# Patient Record
Sex: Female | Born: 1935 | Race: White | Hispanic: No | Smoking: Never smoker
Health system: Southern US, Community
[De-identification: ages and names within clinical notes are randomized; demographics above are authoritative.]

## PROBLEM LIST (undated history)

## (undated) DIAGNOSIS — F0280 Dementia in other diseases classified elsewhere without behavioral disturbance: Secondary | ICD-10-CM

## (undated) DIAGNOSIS — G309 Alzheimer's disease, unspecified: Secondary | ICD-10-CM

## (undated) DIAGNOSIS — E7800 Pure hypercholesterolemia, unspecified: Secondary | ICD-10-CM

---

## 2007-06-10 ENCOUNTER — Emergency Department: Payer: Self-pay | Admitting: Emergency Medicine

## 2007-06-10 IMAGING — CT CT MAXILLOFACIAL WITHOUT CONTRAST
1 series · 15 of 30 positions shown, 19 images · non-contrast
Comparison: none

REASON FOR EXAM: Facial Trauma
COMMENTS:   LMP: Post-Menopausal

[Series 2: orbit/face · axial · 0.30mm/px · z∈[-10,+134]mm · 15 of 52 slices shown, 19 images]
[im 2/52  brain]
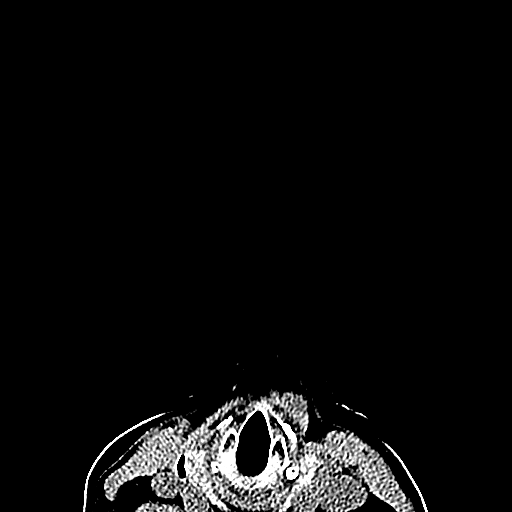
[im 2/52  bone]
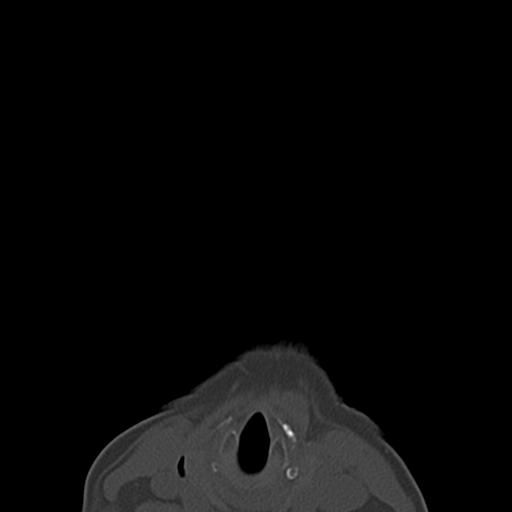
[im 6/52  bone]
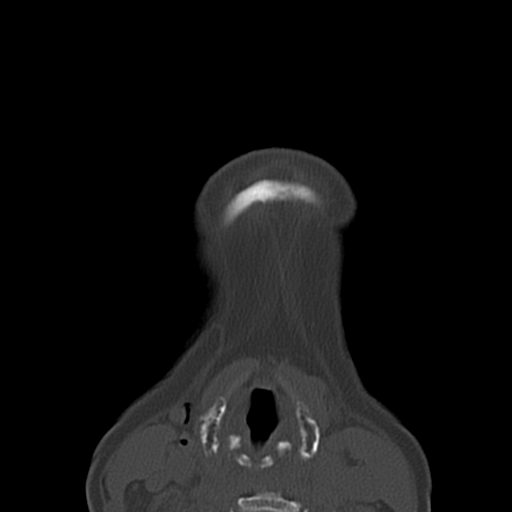
[im 9/52  bone]
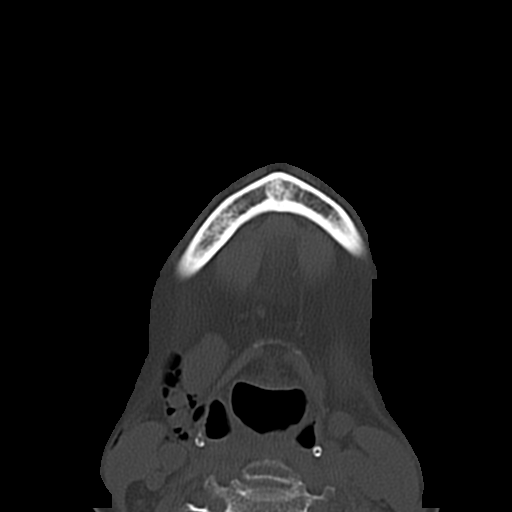
[im 13/52  bone]
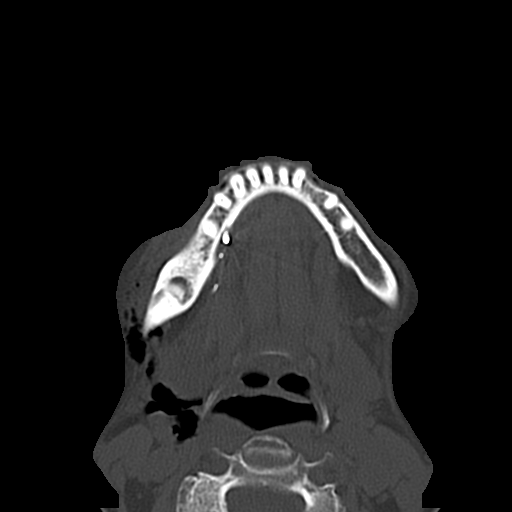
[im 16/52  brain]
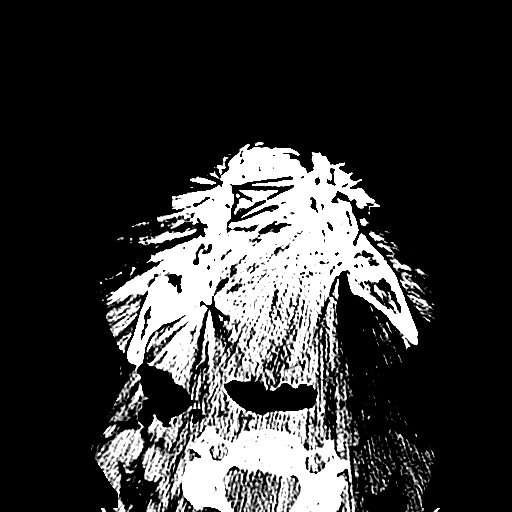
[im 16/52  bone]
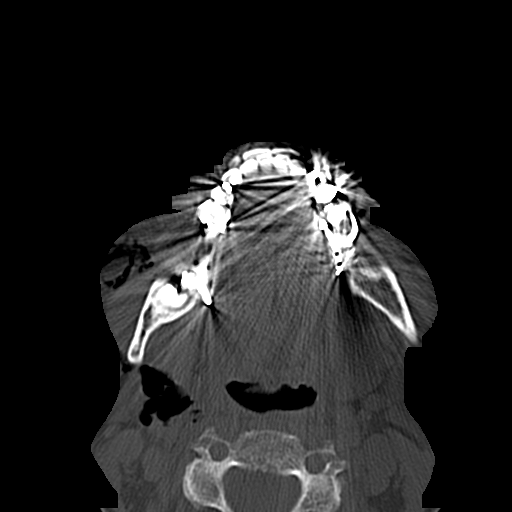
[im 20/52  bone]
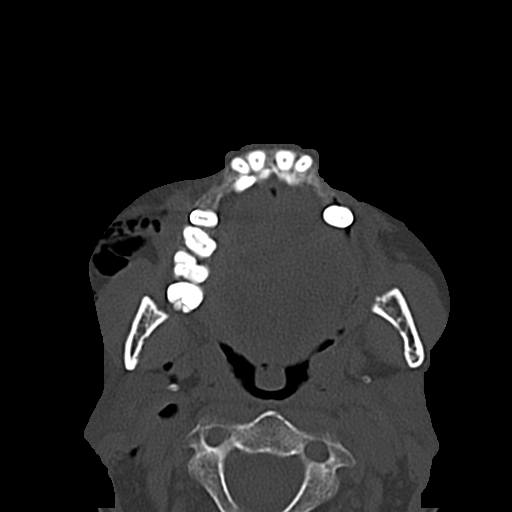
[im 23/52  bone]
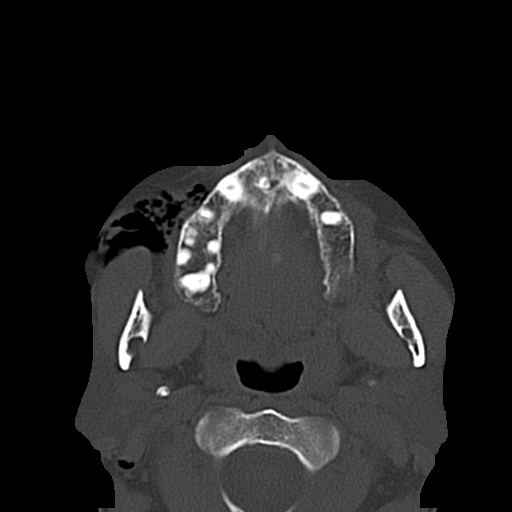
[im 27/52  bone]
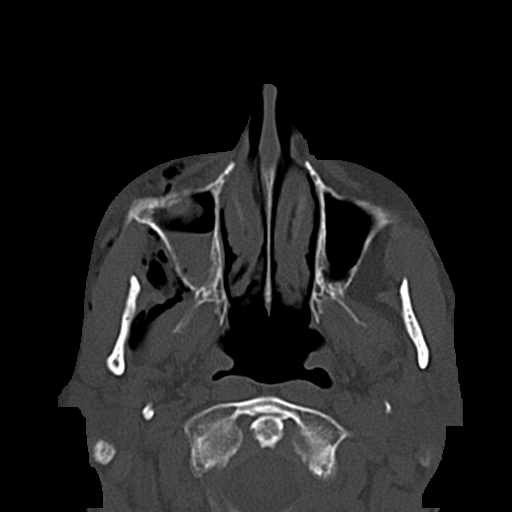
[im 29/52  brain]
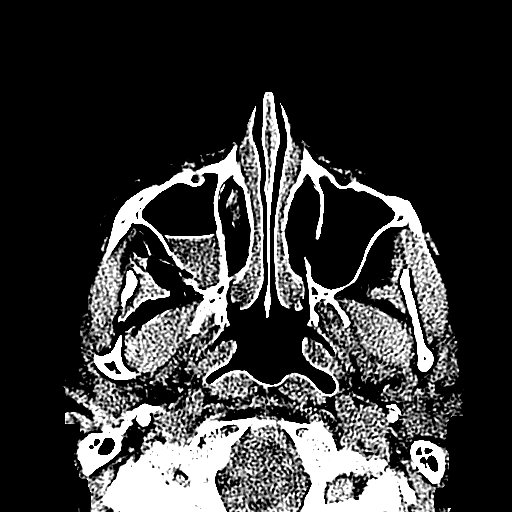
[im 29/52  bone]
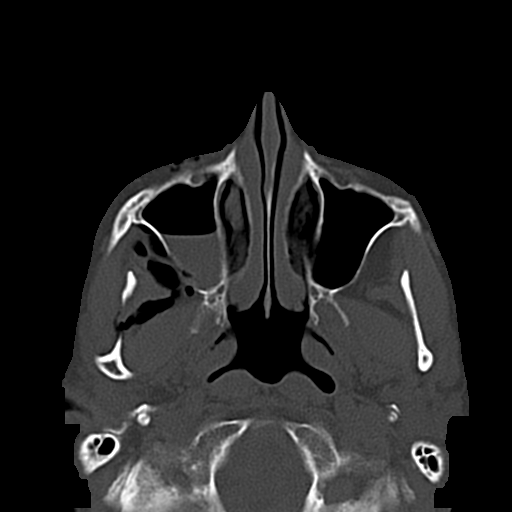
[im 32/52  bone]
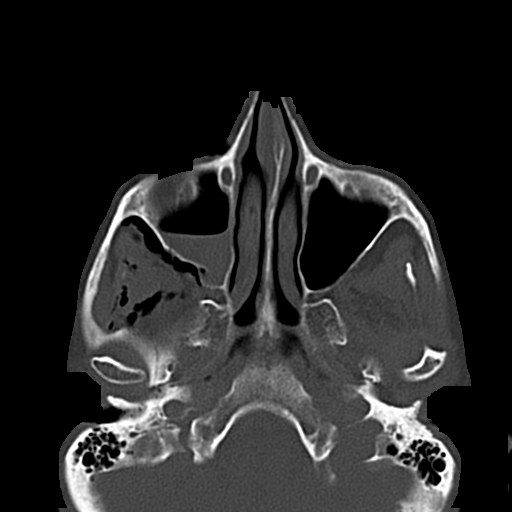
[im 36/52  bone]
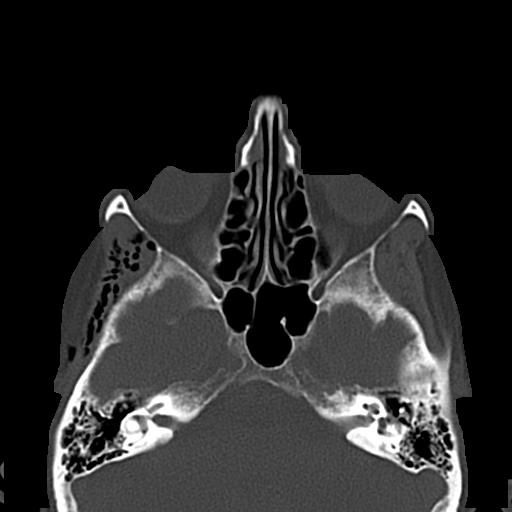
[im 39/52  bone]
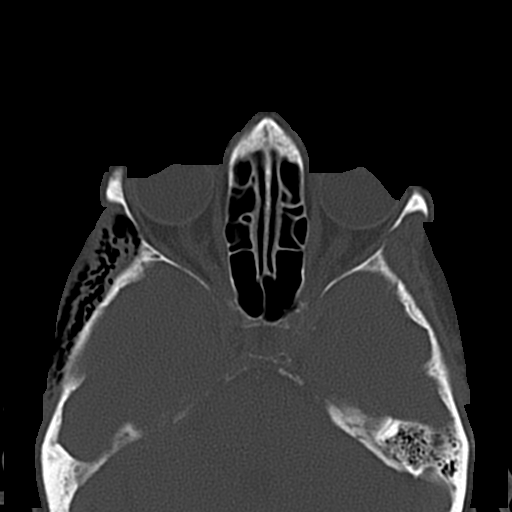
[im 43/52  brain]
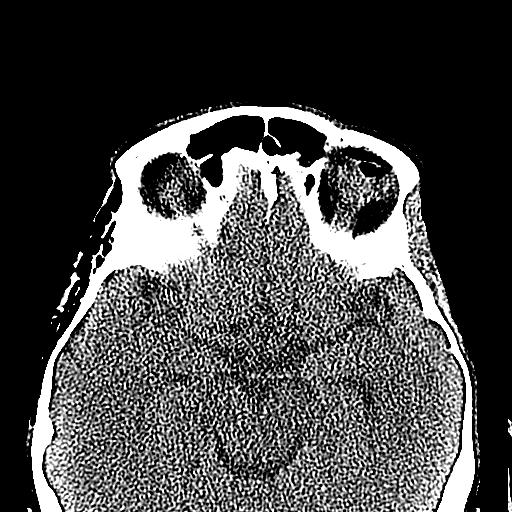
[im 43/52  bone]
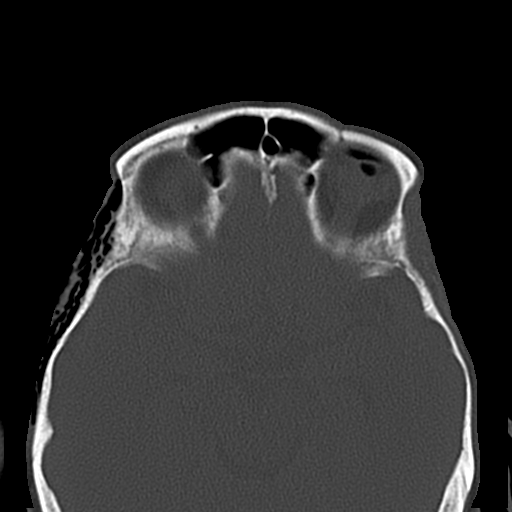
[im 46/52  bone]
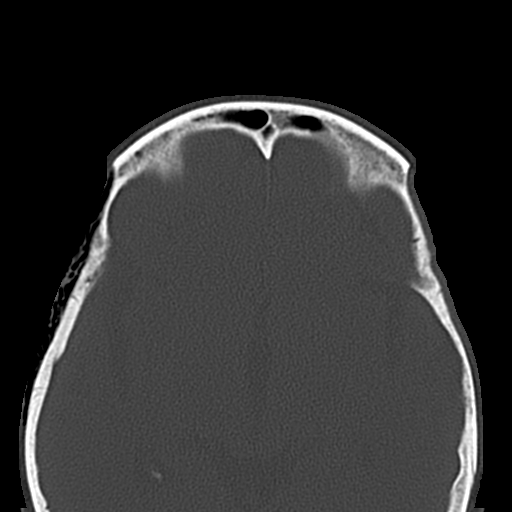
[im 50/52  bone]
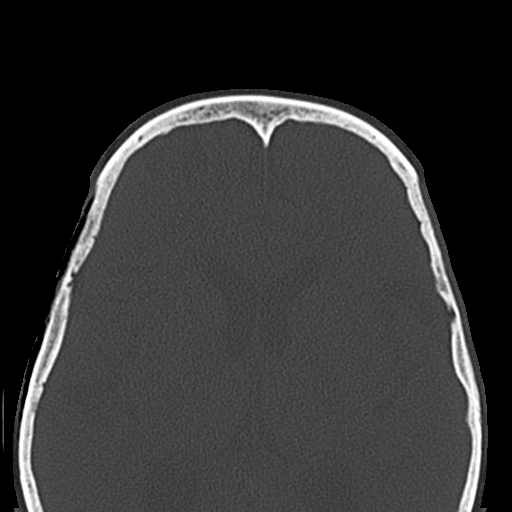

[15 of 30 positions shown; findings below may reference images not displayed]

PROCEDURE:     CT  - CT MAXILLOFACIAL AREA WO  - [DATE]  [DATE]

RESULT:     Axial and coronal imaging of the bones of the face was obtained
utilizing a bone algorithm.

A depressed fracture is appreciated along the lateral wall of the maxillary
sinus on the RIGHT.  Air-fluid levels are identified within the maxillary
sinus indicative of hemorrhage.  A mild depressed fracture is identified
along the floor of the orbit. Subcutaneous emphysema is appreciated along
the subcutaneous tissues of the temporal region extending along the jaw and
into the area of the muscles of mastication.  The mandible appears to be
intact.
IMPRESSION: 1.     Multiple fractures involving the floor of the orbit, lateral wall of
the maxillary sinus on the RIGHT.
2.     Not mentioned above, there are also findings concerning for a
nondisplaced fracture versus a suture along the lateral wall of the orbit.
3.     Dr. RAMOCSA of the Emergency Department was informed of these findings
at the time of the initial interpretation.

## 2007-06-10 IMAGING — CT CT HEAD WITHOUT CONTRAST
1 series · 15 of 30 positions shown, 19 images · non-contrast
Comparison: none

REASON FOR EXAM: Head Trauma
COMMENTS:   LMP: Post-Menopausal

[Series 2: soft tissue · axial · 0.41mm/px · z∈[+74,+209]mm · 15 of 30 slices shown, 19 images]
[im 2/30  brain]
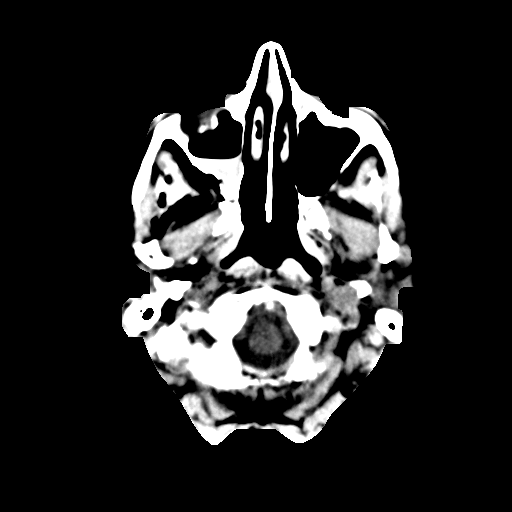
[im 2/30  bone]
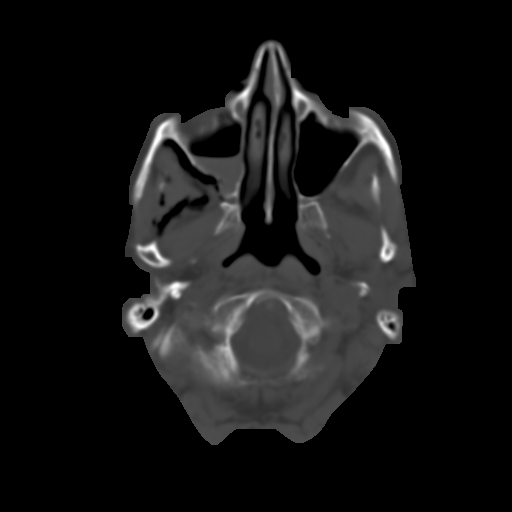
[im 4/30  brain]
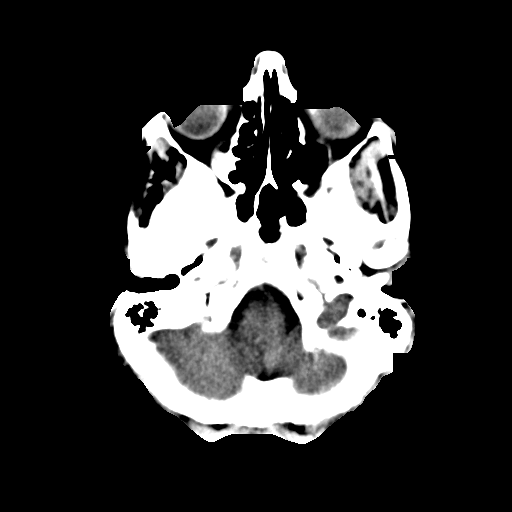
[im 6/30  brain]
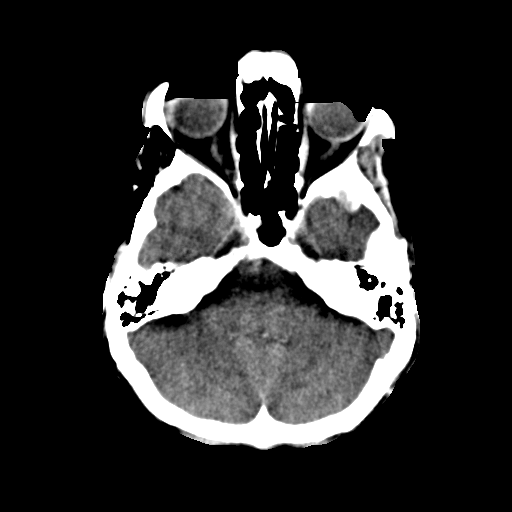
[im 8/30  brain]
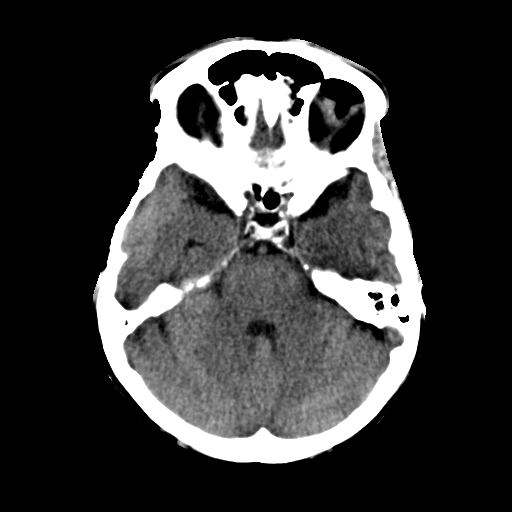
[im 10/30  brain]
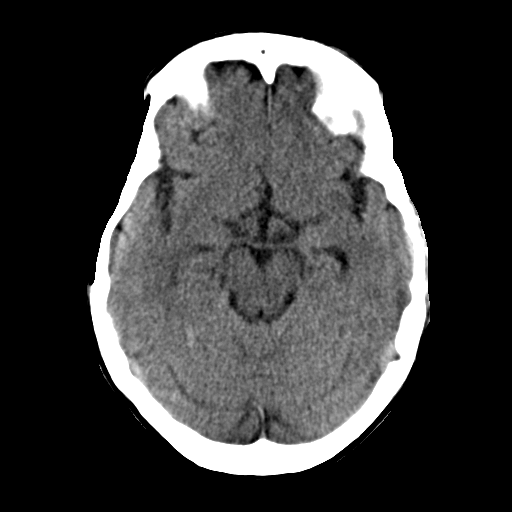
[im 10/30  bone]
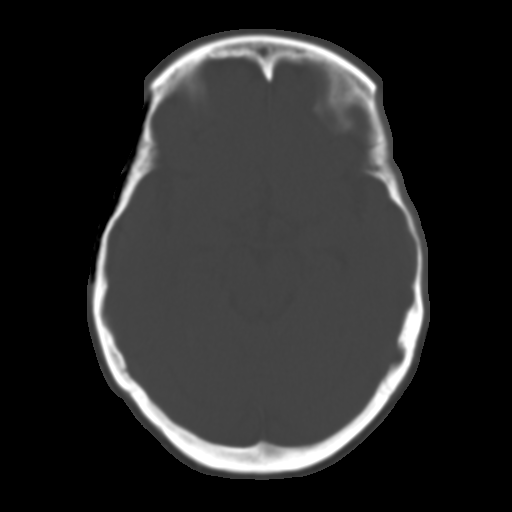
[im 12/30  brain]
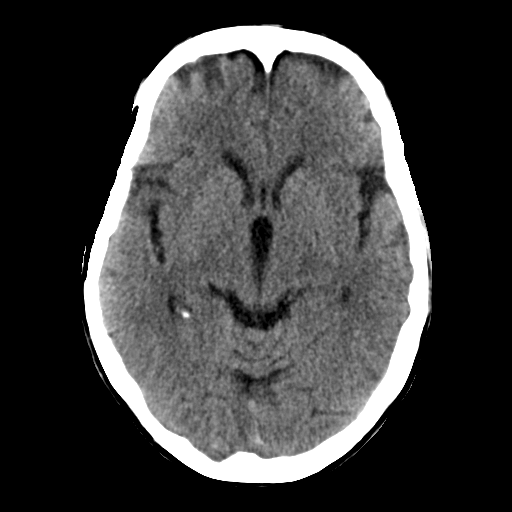
[im 14/30  brain]
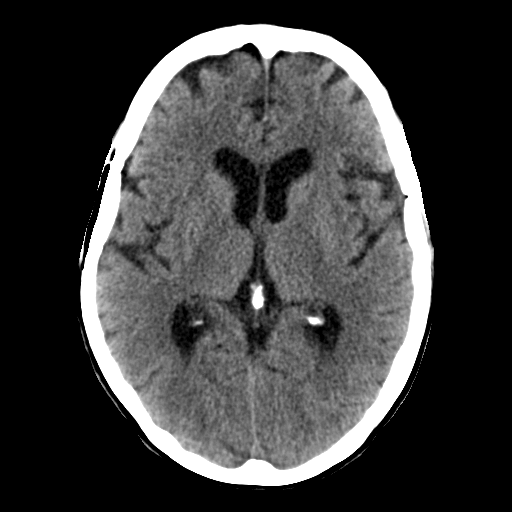
[im 16/30  brain]
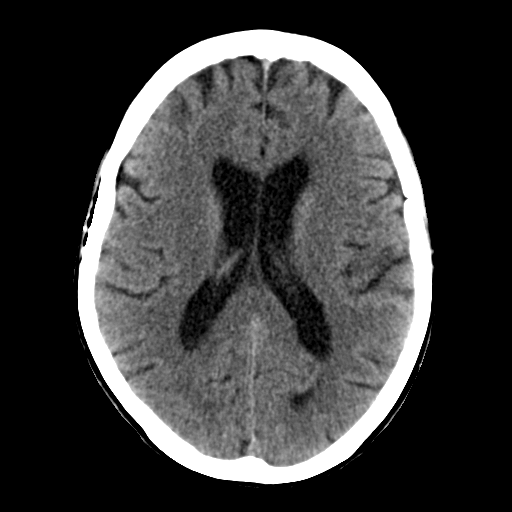
[im 17/30  brain]
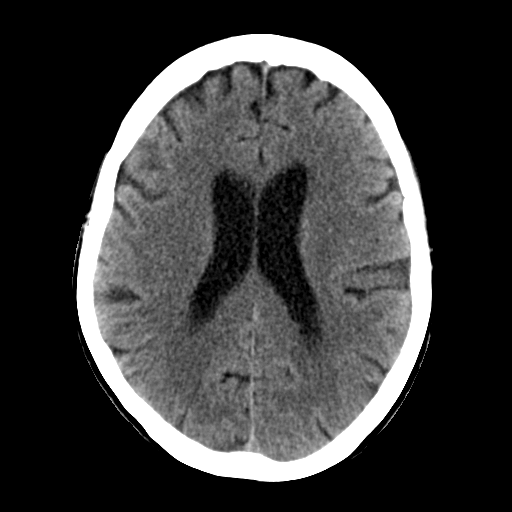
[im 17/30  bone]
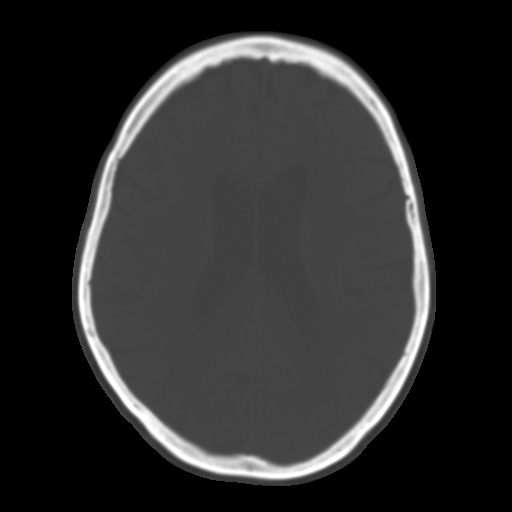
[im 19/30  brain]
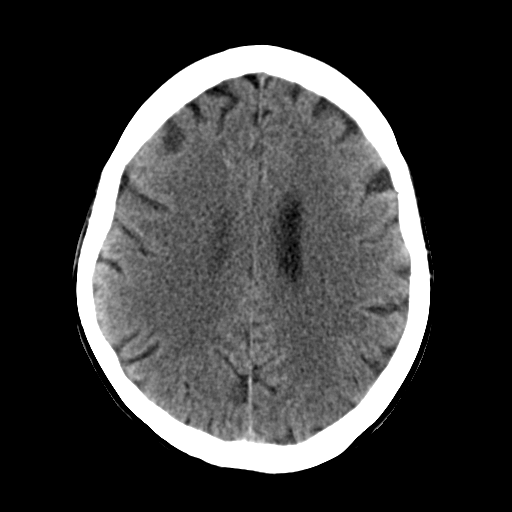
[im 21/30  brain]
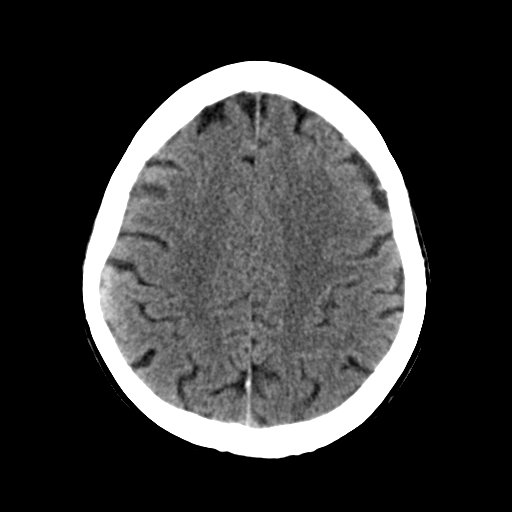
[im 23/30  brain]
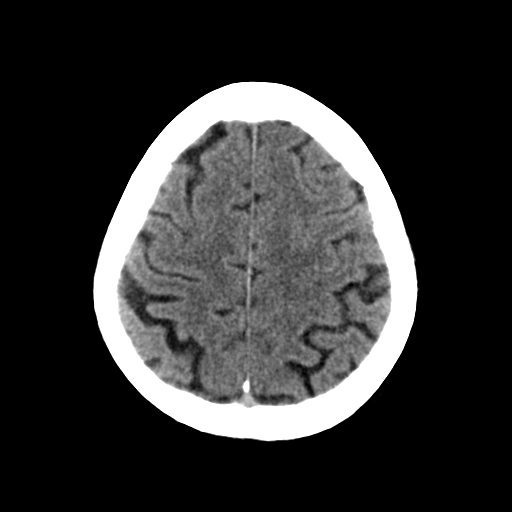
[im 25/30  brain]
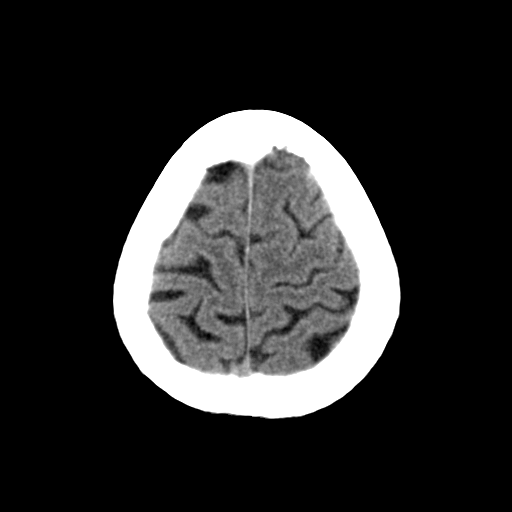
[im 25/30  bone]
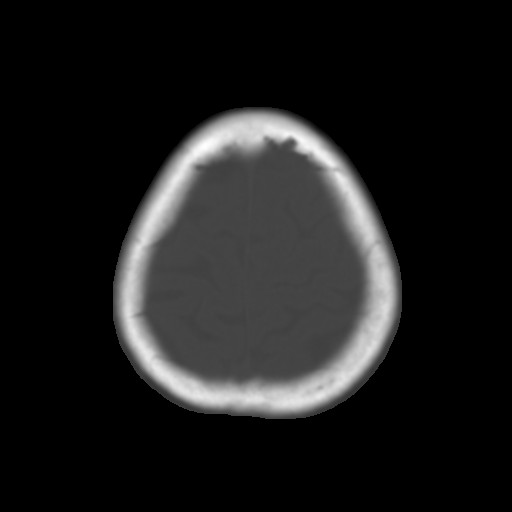
[im 27/30  brain]
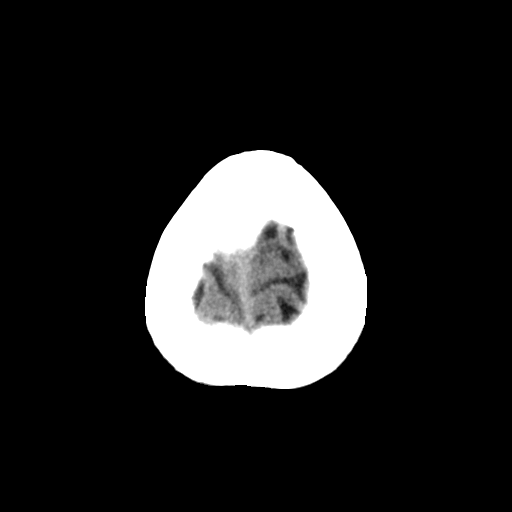
[im 29/30  brain]
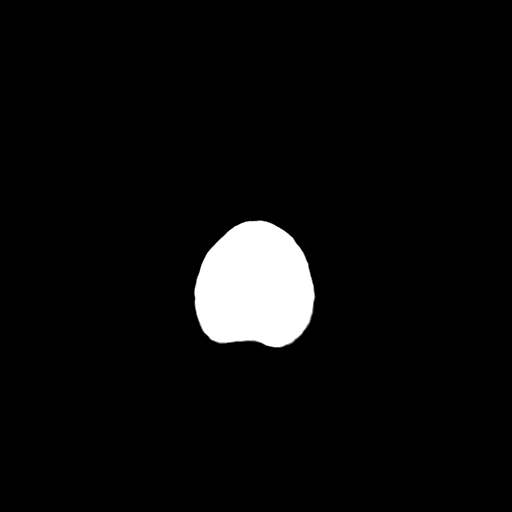

[15 of 30 positions shown; findings below may reference images not displayed]

PROCEDURE:     CT  - CT HEAD WITHOUT CONTRAST  - [DATE]  [DATE]

RESULT:     There is no evidence of intraaxial or extraaxial fluid
collection or evidence of acute hemorrhage. No secondary signs are
appreciated to suggest mass effect, subacute or chronic infarction.
Evaluation of the visualized bony skeleton demonstrates an air-fluid level
within the RIGHT maxillary sinus. The fluid is of high density and is
concerning for hemorrhage. Emphysema is identified along the anterior
temporal portion of the subcutaneous tissues as well as involving the
masseter region.  Further evaluation with facial CT is recommended.
IMPRESSION: 1.     No evidence of intracranial abnormalities.
2.     Findings concerning for osseous abnormalities involving the maxillary
sinus on the RIGHT as well as possibly the zygoma and mandible.
3.     Not mentioned above, there is evidence of mildly depressed fracture
along the posterior aspect of the lateral wall of the maxillary sinus on the
RIGHT.
4.     Dr. EBADAT of the Emergency Department was informed of these findings
at the time of the initial interpretation.

## 2008-08-26 ENCOUNTER — Encounter: Payer: Self-pay | Admitting: General Practice

## 2008-09-22 ENCOUNTER — Encounter: Payer: Self-pay | Admitting: General Practice

## 2008-10-22 ENCOUNTER — Encounter: Payer: Self-pay | Admitting: General Practice

## 2016-12-05 ENCOUNTER — Other Ambulatory Visit (HOSPITAL_COMMUNITY): Payer: Self-pay | Admitting: Neurology

## 2016-12-05 DIAGNOSIS — R413 Other amnesia: Secondary | ICD-10-CM

## 2016-12-19 ENCOUNTER — Ambulatory Visit (HOSPITAL_COMMUNITY): Payer: Medicare Other

## 2017-10-28 ENCOUNTER — Other Ambulatory Visit: Payer: Self-pay | Admitting: Internal Medicine

## 2017-10-28 DIAGNOSIS — Z1231 Encounter for screening mammogram for malignant neoplasm of breast: Secondary | ICD-10-CM

## 2018-09-21 IMAGING — CR DG HIP (WITH OR WITHOUT PELVIS) 2-3V*L*
1 series · 3 of 3 positions shown · non-contrast
Comparison: None.

CLINICAL DATA: Chronic left hip pain without known injury.

EXAM:
DG HIP (WITH OR WITHOUT PELVIS) 2-3V LEFT

[Series 1: dg hip unilat w or w/o pelvis 2-3 views  · non-contrast · 0.14mm/px · 3 of 3 slices shown]
[im 1/3]
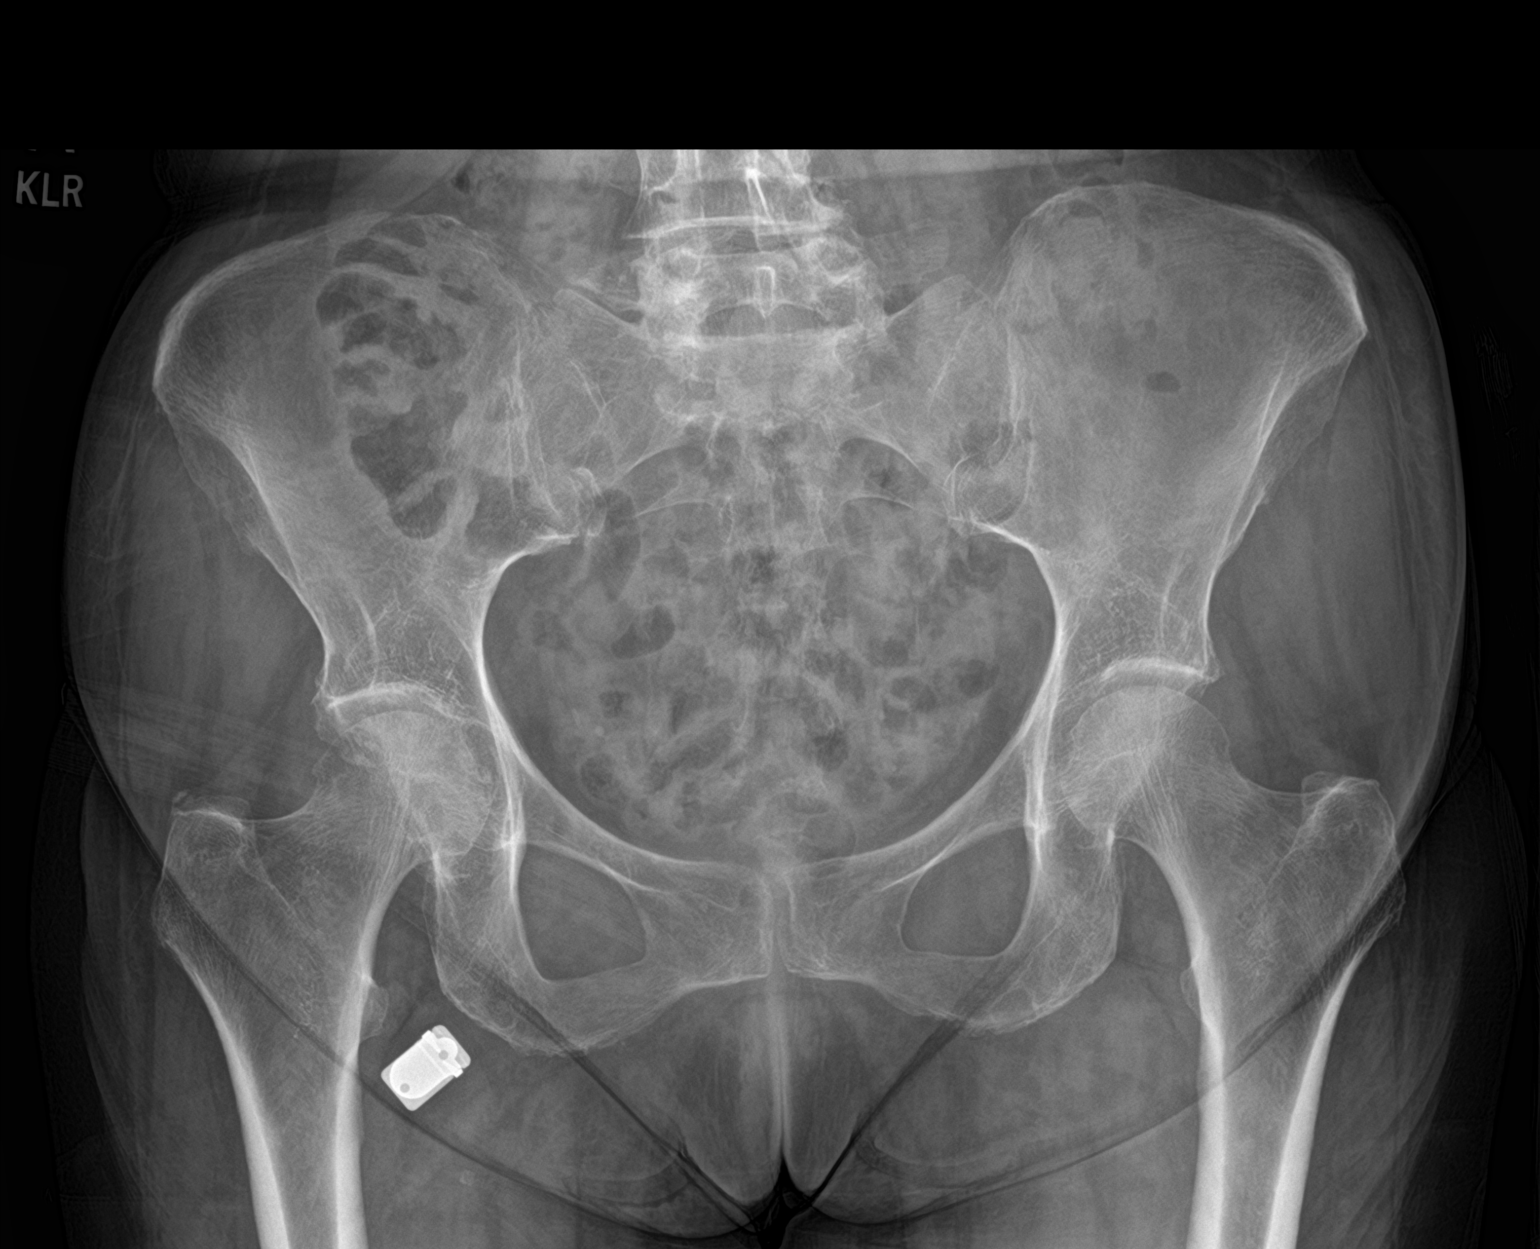
[im 2/3]
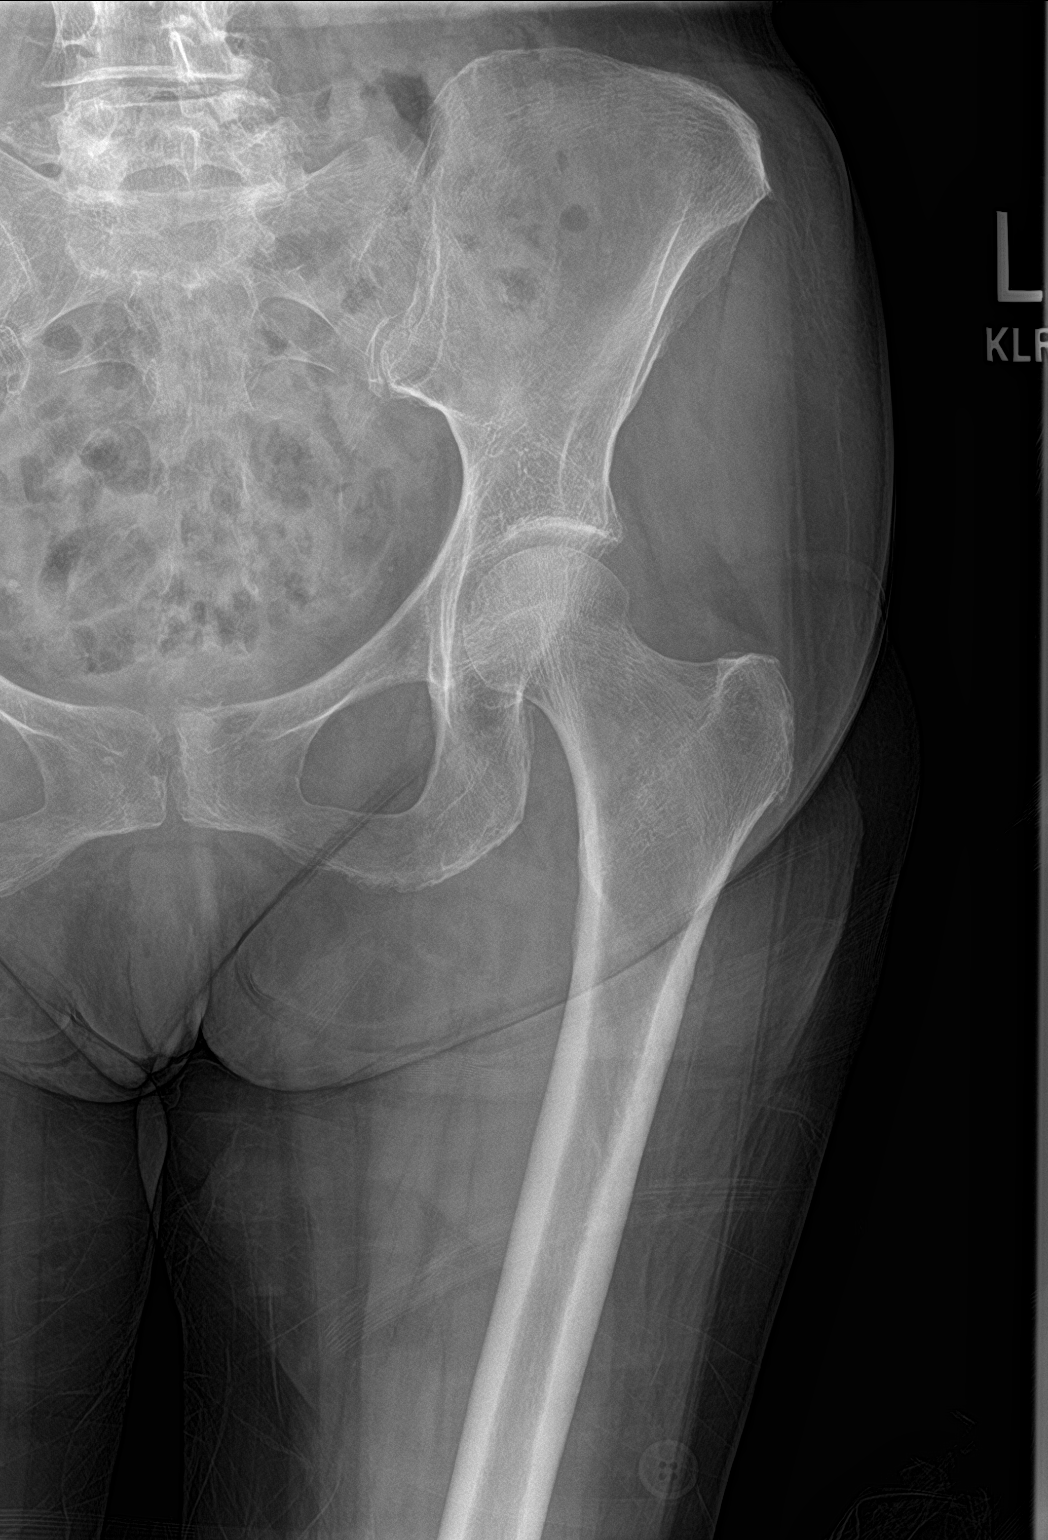
[im 3/3]
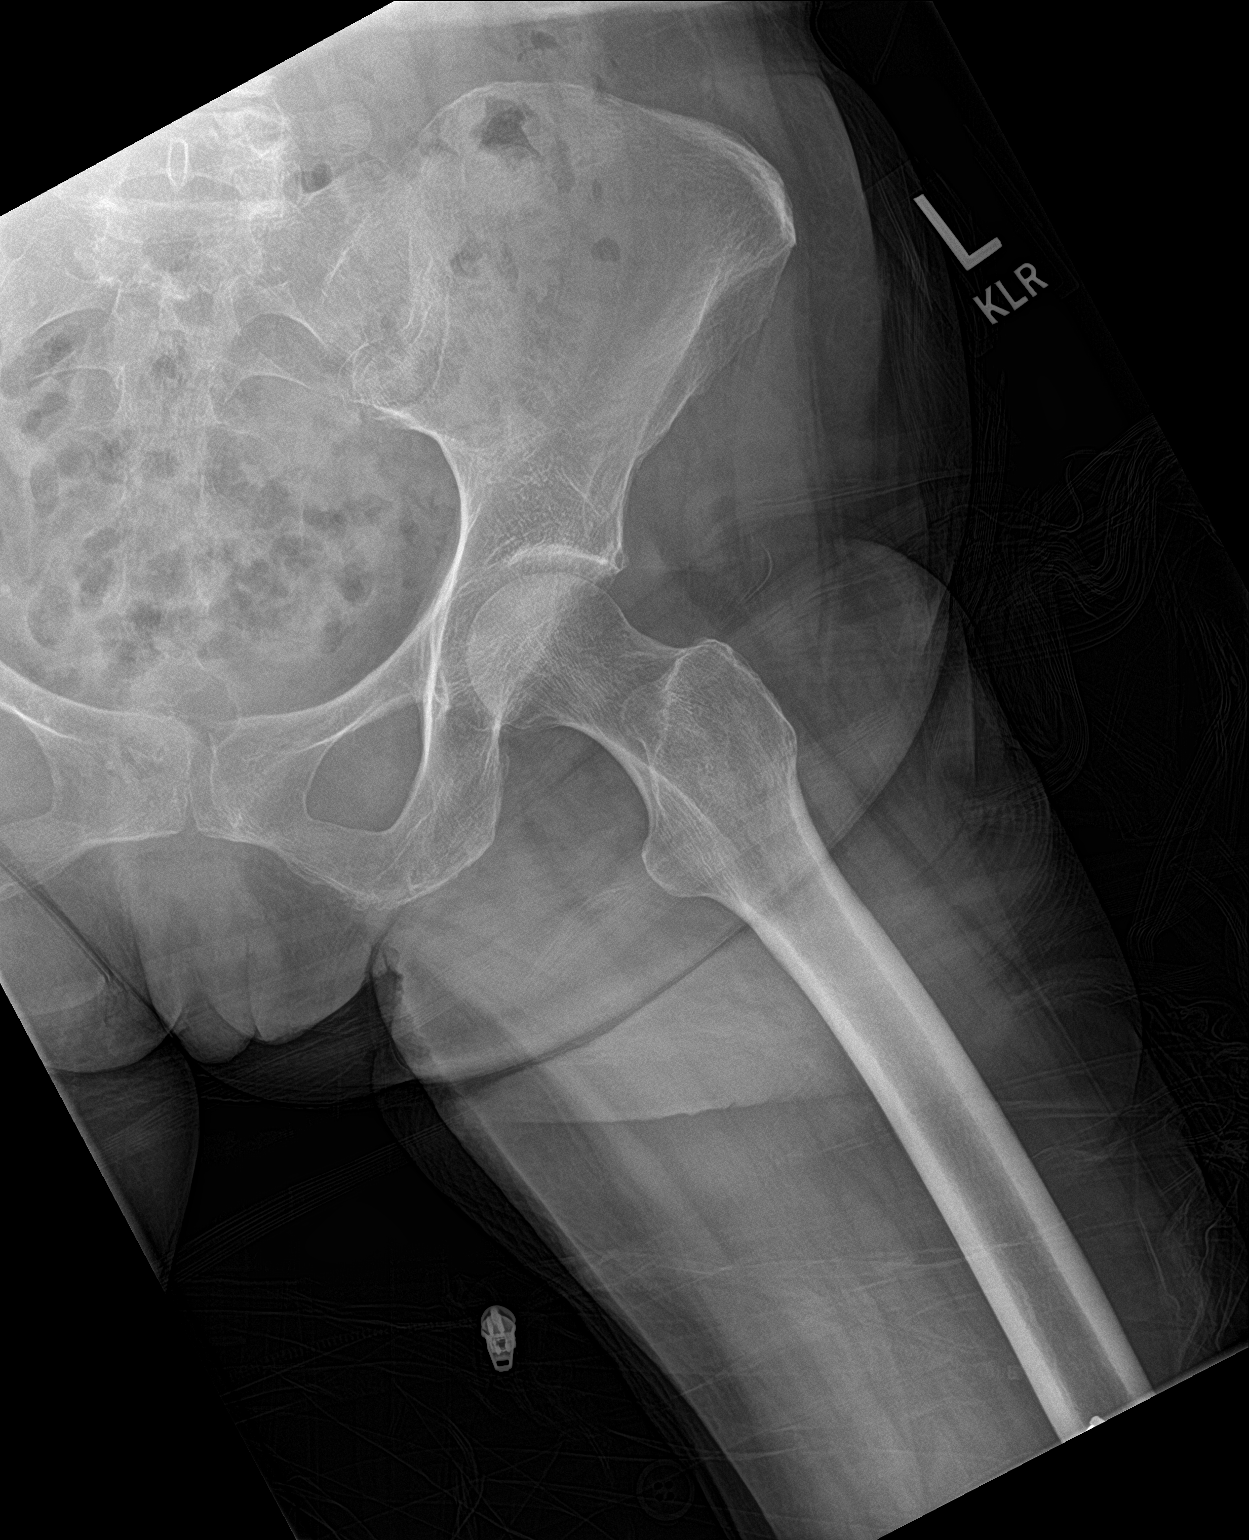

[3 of 3 positions shown; findings below may reference images not displayed]

FINDINGS: There is no evidence of hip fracture or dislocation. There is no
evidence of arthropathy or other focal bone abnormality.
IMPRESSION: Negative.

## 2018-10-30 ENCOUNTER — Other Ambulatory Visit: Payer: Self-pay | Admitting: Internal Medicine

## 2018-10-30 DIAGNOSIS — R413 Other amnesia: Secondary | ICD-10-CM

## 2018-11-12 ENCOUNTER — Other Ambulatory Visit: Payer: Self-pay

## 2018-11-12 ENCOUNTER — Ambulatory Visit
Admission: RE | Admit: 2018-11-12 | Discharge: 2018-11-12 | Disposition: A | Discharge status: Home / Self Care | Payer: Medicare Other | Source: Ambulatory Visit | Attending: Internal Medicine | Admitting: Internal Medicine

## 2018-11-12 DIAGNOSIS — R413 Other amnesia: Secondary | ICD-10-CM | POA: Insufficient documentation

## 2018-11-12 IMAGING — MR MR HEAD W/O CM
11 series · 40 of 48 positions shown · non-contrast
Comparison: Head CT [DATE]

CLINICAL DATA: 86-year-old female with memory loss, repetitive
speech in conversation.

EXAM:
MRI HEAD WITHOUT CONTRAST
TECHNIQUE: Multiplanar, multiecho pulse sequences of the brain and surrounding
structures were obtained without intravenous contrast.

[Series 5: ax dwi_tracew · axial · 3.0mm · 0.60mm/px · z∈[-89,+55]mm · 4 of 46 slices shown]
[im 1/46]
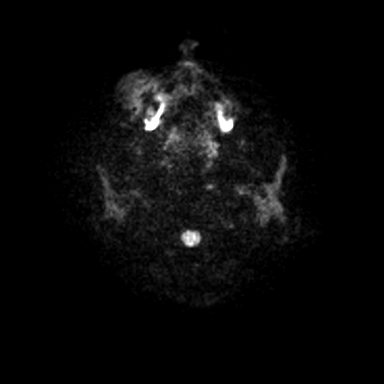
[im 16/46]
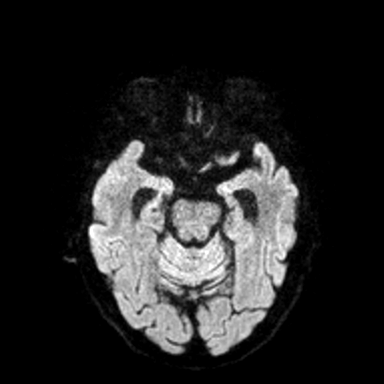
[im 31/46]
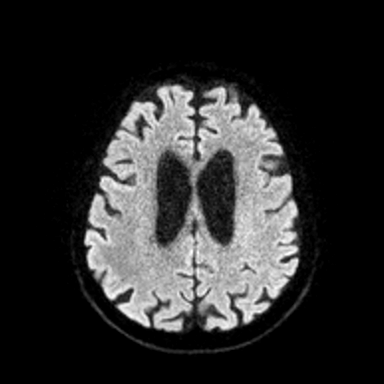
[im 46/46]
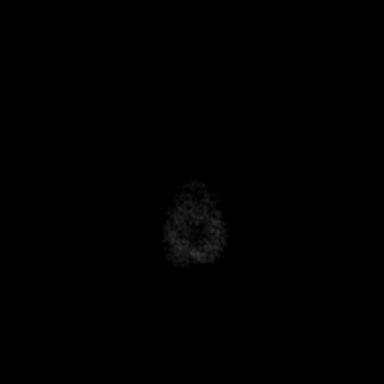

[Series 6: ax dwi_adc · axial · 3.0mm · 0.60mm/px · z∈[-89,+55]mm · 4 of 46 slices shown]
[im 1/46]
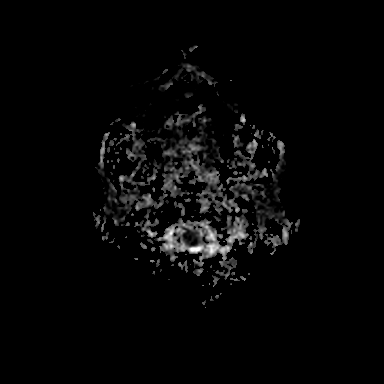
[im 16/46]
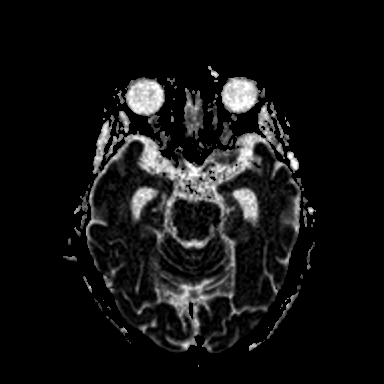
[im 31/46]
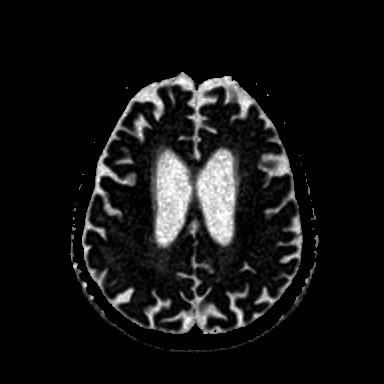
[im 46/46]
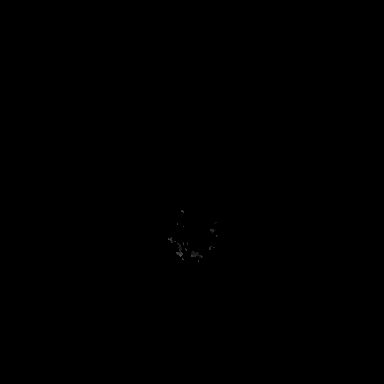

[Series 7: cor dwi_tracew · coronal · 5.0mm · 0.60mm/px · 3 of 40 slices shown]
[im 1/40]
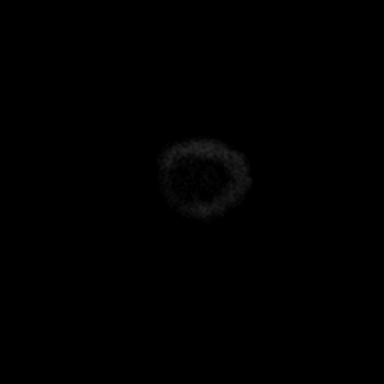
[im 20/40]
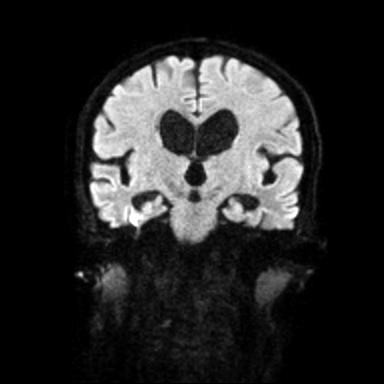
[im 40/40]
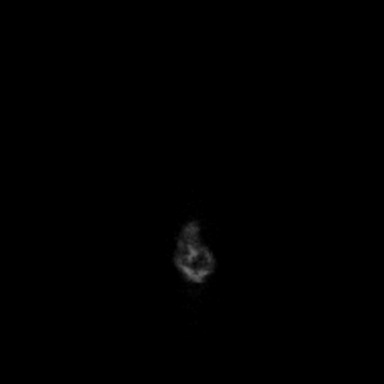

[Series 8: cor dwi_adc · coronal · 5.0mm · 0.60mm/px · 3 of 39 slices shown]
[im 1/39]
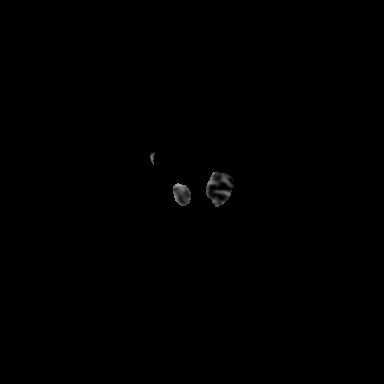
[im 20/39]
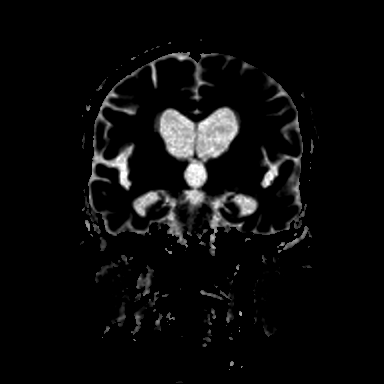
[im 39/39]
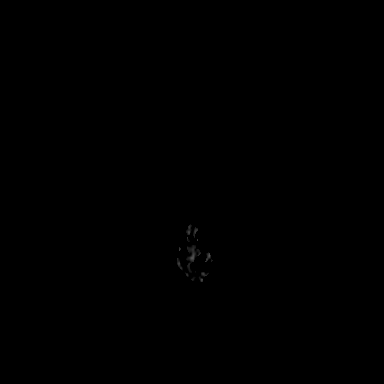

[Series 9: T1 · sagittal · 5.0mm · 0.62mm/px · 2 of 23 slices shown (1 of 2)]
[im 1/23]
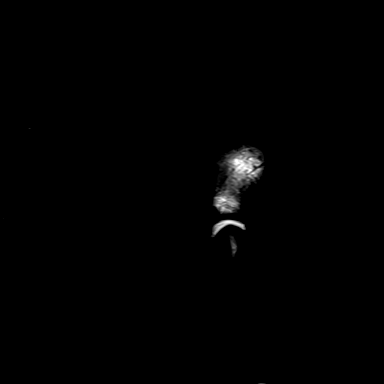
[im 23/23]
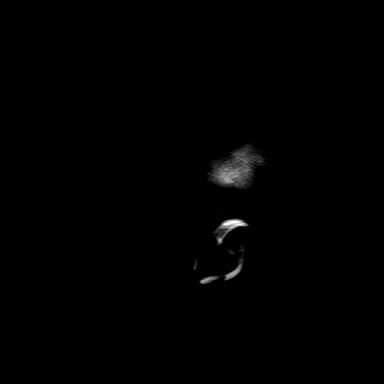

[Series 10: T2 · axial · 5.0mm · 0.53mm/px · z∈[-87,+53]mm · 2 of 25 slices shown (1 of 2)]
[im 1/25]
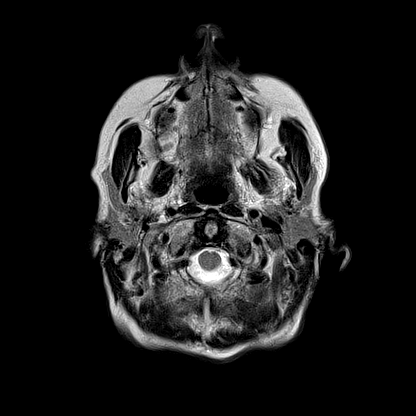
[im 25/25]
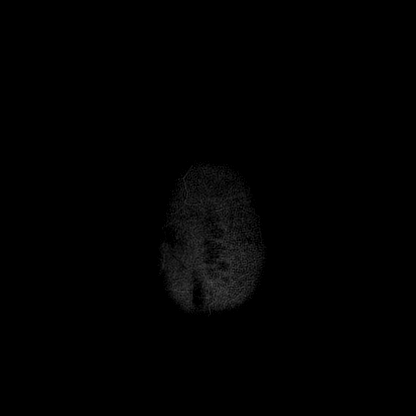

[Series 12: pha_images · axial · 3.0mm · 0.90mm/px · z∈[-101,+71]mm · 5 of 57 slices shown]
[im 1/57]
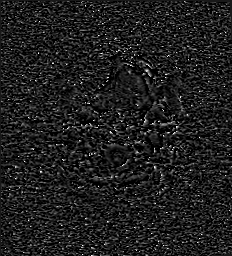
[im 15/57]
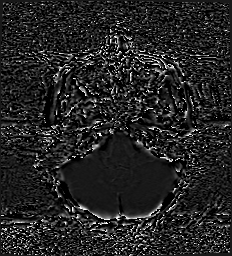
[im 29/57]
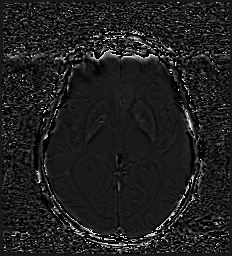
[im 43/57]
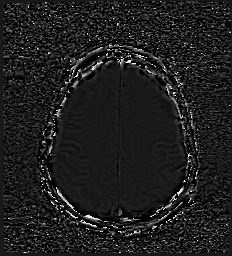
[im 57/57]
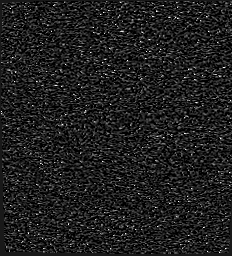

[Series 13: swi_images · axial · 3.0mm · 0.90mm/px · z∈[-101,-61]mm · 2 of 60 slices shown]
[im 1/60]
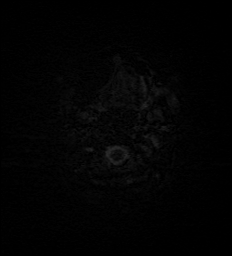
[im 15/60]
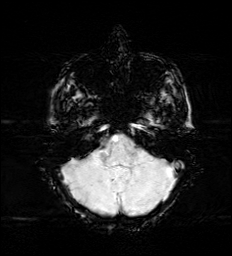

[Series 15: FLAIR · axial · 3.0mm · 0.53mm/px · z∈[-96,+61]mm · 5 of 55 slices shown]
[im 1/55]
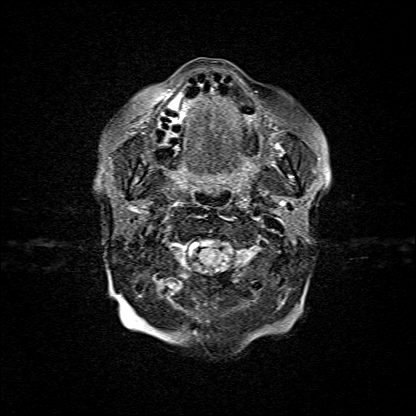
[im 14/55]
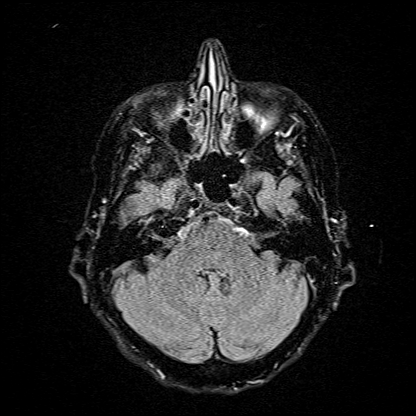
[im 28/55]
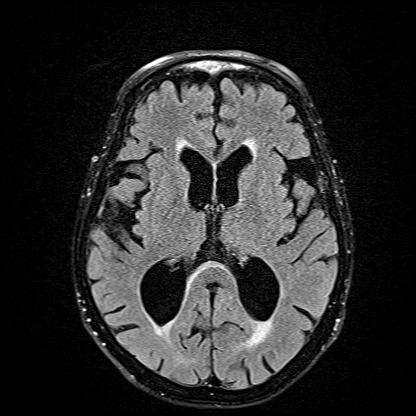
[im 41/55]
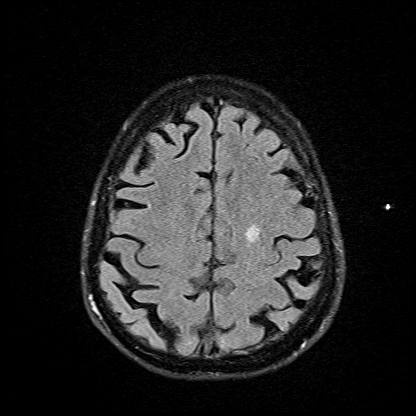
[im 55/55]
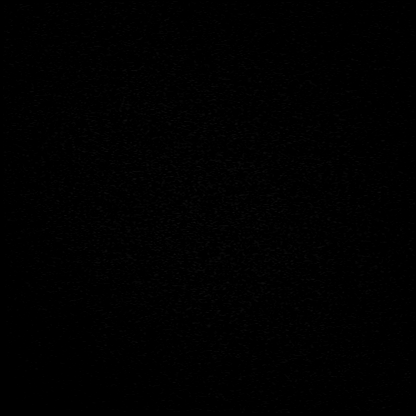

[Series 16: T1 · axial · 1.0mm · 0.98mm/px · z∈[-94,+60]mm · 8 of 160 slices shown (2 of 2)]
[im 1/160]
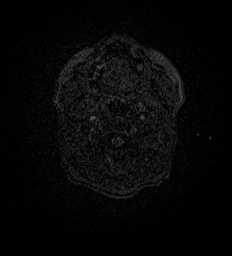
[im 27/160]
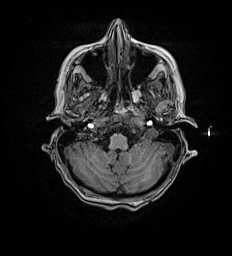
[im 54/160]
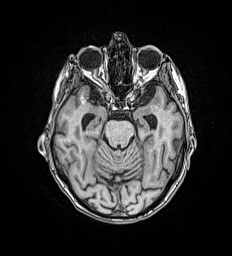
[im 67/160]
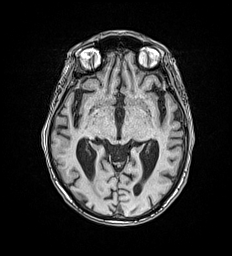
[im 93/160]
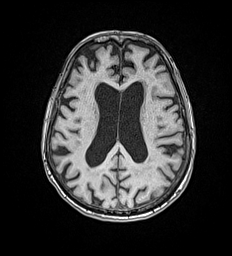
[im 107/160]
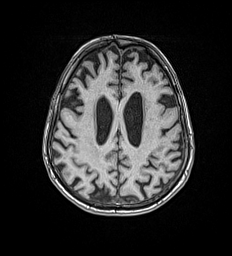
[im 133/160]
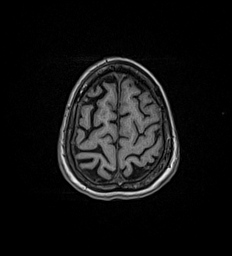
[im 160/160]
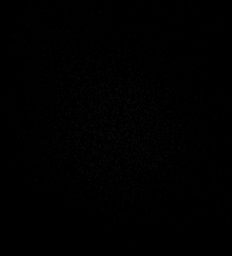

[Series 17: T2 · coronal · 5.0mm · 0.57mm/px · 2 of 29 slices shown (2 of 2)]
[im 1/29]
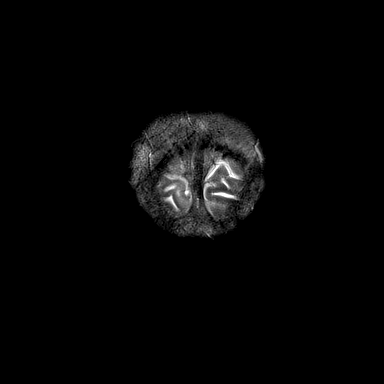
[im 29/29]
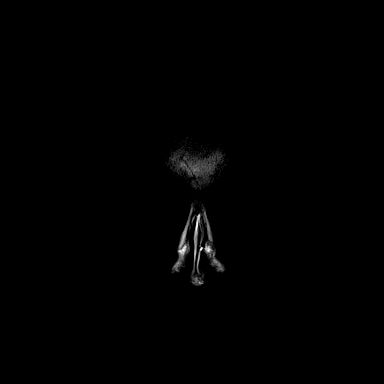

[40 of 48 positions shown; findings below may reference images not displayed]

FINDINGS: Brain: Lateral and 3rd ventricular enlargement since [3Z] with
stable an normal 4th ventricle size. Mild associated temporal horn
enlargement and questionable hyperdynamic flow in the cerebral
aqueduct. No transependymal edema.

There has been supratentorial cerebral volume loss since [3Z], with
questionable disproportionate involvement of the mesial temporal
lobes (series 17, image 16). No cortical encephalomalacia. There is
a chronic microhemorrhage in the posterior right temporal lobe on
series 13, image 27.

No restricted diffusion to suggest acute infarction. No midline
shift, mass effect, evidence of mass lesion, extra-axial collection
or acute intracranial hemorrhage. Cervicomedullary junction and
pituitary are within normal limits. Mild for age nonspecific
cerebral white matter signal changes.

Vascular: Major intracranial vascular flow voids are preserved.

Skull and upper cervical spine: Mild degenerative appearing
ligamentous hypertrophy about the odontoid. Otherwise negative
visible cervical spine. Normal bone marrow signal.

Sinuses/Orbits: Postoperative changes to both globes, otherwise
negative orbits. Paranasal sinuses and mastoids are clear.

Other: Visible internal auditory structures appear normal. Scalp and
face soft tissues appear negative.
IMPRESSION: 1. Disproportionate enlargement of the lateral and 3rd ventricles
since [3Z]. This could be ex vacuo in nature (see #2), but in the
appropriate clinical setting normal pressure hydrocephalus (NPH)
could not be excluded.
2. Cerebral volume loss since [3Z] fairly generalized although with
suggestion of disproportionate mesial temporal lobe involvement.
3. No superimposed acute intracranial abnormality.

## 2018-12-29 ENCOUNTER — Other Ambulatory Visit: Payer: Self-pay

## 2018-12-29 ENCOUNTER — Encounter: Payer: Self-pay | Admitting: Emergency Medicine

## 2018-12-29 ENCOUNTER — Emergency Department: Payer: Medicare Other

## 2018-12-29 ENCOUNTER — Emergency Department
Admission: EM | Admit: 2018-12-29 | Discharge: 2018-12-29 | Disposition: A | Discharge status: Home / Self Care | Payer: Medicare Other | Attending: Emergency Medicine | Admitting: Emergency Medicine

## 2018-12-29 DIAGNOSIS — G309 Alzheimer's disease, unspecified: Secondary | ICD-10-CM | POA: Diagnosis not present

## 2018-12-29 DIAGNOSIS — Z79899 Other long term (current) drug therapy: Secondary | ICD-10-CM | POA: Insufficient documentation

## 2018-12-29 DIAGNOSIS — M25552 Pain in left hip: Secondary | ICD-10-CM | POA: Insufficient documentation

## 2018-12-29 DIAGNOSIS — R52 Pain, unspecified: Secondary | ICD-10-CM

## 2018-12-29 HISTORY — DX: Dementia in other diseases classified elsewhere, unspecified severity, without behavioral disturbance, psychotic disturbance, mood disturbance, and anxiety: G30.9

## 2018-12-29 HISTORY — DX: Dementia in other diseases classified elsewhere, unspecified severity, without behavioral disturbance, psychotic disturbance, mood disturbance, and anxiety: F02.80

## 2018-12-29 MED ORDER — PREDNISONE 5 MG PO TABS: 5.0000 mg | ORAL_TABLET | Freq: Every day | ORAL | 0 refills | Status: DC

## 2018-12-29 MED ORDER — DEXAMETHASONE SODIUM PHOSPHATE 10 MG/ML IJ SOLN
10.0000 mg | Freq: Once | INTRAMUSCULAR | Status: AC
  Administered 2018-12-29: 10 mg via INTRAMUSCULAR
  Filled 2018-12-29: qty 1

## 2018-12-29 NOTE — ED Provider Notes (Signed)
[facility] Emergency Department Provider Note  ____________________________________________   First MD Initiated Contact with Patient 12/29/18 [phone]     (approximate)  I have reviewed the triage vital signs and the nursing notes.   HISTORY  Chief Complaint Hip Pain Patient and son.  HPI Dana Love is a 85 y.o. female presents to the ED with complaint of left lateral hip pain that has been going on for a couple of days however patient had increased pain with weightbearing today.  Patient denies any history of injury or falls.  Son states that she normally walks faster than he does.  Patient states that she does not use a walker.  Patient denies any other pain and is consistent with her left hip.  She rates her pain as a 9/10.      Past Medical History:  Diagnosis Date  . Alzheimer disease (HCC)     There are no active problems to display for this patient.   History reviewed. No pertinent surgical history.  Prior to Admission medications   Medication Sig Start Date End Date Taking? Authorizing Provider  atorvastatin (LIPITOR) 10 MG tablet Take 10 mg by mouth daily.   Yes [provider]  donepezil (ARICEPT) 10 MG tablet Take 10 mg by mouth at bedtime.   Yes [provider]  raloxifene (EVISTA) 60 MG tablet Take 60 mg by mouth daily.   Yes [provider]  sertraline (ZOLOFT) 25 MG tablet Take 25 mg by mouth daily.   Yes [provider]  vitamin B-12 (CYANOCOBALAMIN) 500 MCG tablet Take 500 mcg by mouth daily.   Yes [provider]  predniSONE (DELTASONE) 5 MG tablet Take 1 tablet (5 mg total) by mouth daily with breakfast. 12/29/18   Tommi Rumps, PA-C    Allergies Patient has no allergy information on record.  No family history on file.  Social History Social History   Tobacco Use  . Smoking status: Not on file  Substance Use Topics  . Alcohol use: Not on file  . Drug use: Not on file     Review of Systems Constitutional: No fever/chills Eyes: No visual changes. Cardiovascular: Denies chest pain. Respiratory: Denies shortness of breath. Genitourinary: Negative for dysuria. Musculoskeletal: Positive for left hip pain. Skin: Negative for rash. Neurological: Negative for headaches, focal weakness or numbness. ____________________________________________   PHYSICAL EXAM:  VITAL SIGNS: ED Triage Vitals  Enc Vitals Group     BP 12/29/18 0852 (!) 160/72     Pulse Rate 12/29/18 0852 65     Resp 12/29/18 0852 20     Temp 12/29/18 0852 97.7 F (36.5 C)     Temp Source 12/29/18 0852 Oral     SpO2 12/29/18 0852 99 %     Weight 12/29/18 0847 110 lb (49.9 kg)     Height 12/29/18 0847 5\' 1"  (1.549 m)     Head Circumference --      Peak Flow --      Pain Score 12/29/18 0847 9     Pain Loc --      Pain Edu? --      Excl. in [place]? --     Constitutional: Alert and oriented. Well appearing and in no acute distress. Eyes: Conjunctivae are normal.  Head: Atraumatic. Neck: No stridor.   Cardiovascular: Normal rate, regular rhythm. Grossly normal heart sounds.  Good peripheral circulation. Respiratory: Normal respiratory effort.  No retractions. Lungs CTAB. Gastrointestinal: Soft and nontender. No  distention. Musculoskeletal: Examination of the left lateral hip there is no gross deformity, no soft tissue tenderness and no discoloration or abrasions were noted.  Patient is able to flex and extend at the hip with out any pain.  No restriction with range of motion is patient is able to abduct and abduct without any difficulty or restrictions.  Patient is extremely agile with range of motion.  Examination of the left knee without any edema or effusion present.  No crepitus with range of motion.  There is no point tenderness on palpation of the distal femur. Neurologic:  Normal speech and language. No gross focal neurologic deficits are appreciated.  Skin:  Skin is warm, dry and  intact.  No skin discoloration noted. Psychiatric: Mood and affect are normal. Speech and behavior are normal.  ____________________________________________   LABS (all labs ordered are listed, but only abnormal results are displayed)  Labs Reviewed - No data to display  RADIOLOGY  Official radiology report(s): Dg Hip Unilat With Pelvis 2-3 Views Left  Result Date: 12/29/2018 CLINICAL DATA:  Chronic left hip pain without known injury. EXAM: DG HIP (WITH OR WITHOUT PELVIS) 2-3V LEFT COMPARISON:  None. FINDINGS: There is no evidence of hip fracture or dislocation. There is no evidence of arthropathy or other focal bone abnormality. IMPRESSION: Negative. Electronically Signed   By: Marijo Conception M.D.   On: 12/29/2018 09:30    ____________________________________________   PROCEDURES  Procedure(s) performed (including Critical Care):  Procedures   ____________________________________________   INITIAL IMPRESSION / ASSESSMENT AND PLAN / ED COURSE  As part of my medical decision making, I reviewed the following data within the electronic MEDICAL RECORD NUMBER Notes from prior ED visits and [place] Controlled Substance Database  85 year old female is brought to the ED by son with complaint of left hip pain for several days.  Patient states that she woke up this morning with increased pain with weightbearing.  She denies any injury to her left hip.  Patient does not walk with a walker and son states that she walks faster than he does.  Physical exam is benign.  X-rays were reassuring and she and her family were made aware that there was no fracture.  Patient was given a walker while here in the ED for added support.  She was given Decadron IM while in the ED along with a prescription to continue for the next several days.  She is to follow-up with her PCP if any continued problems and return to the emergency department if any severe worsening of her symptoms.   ____________________________________________   FINAL CLINICAL IMPRESSION(S) / ED DIAGNOSES  Final diagnoses:  Pain  Acute pain of left hip     ED Discharge Orders         Ordered    predniSONE (DELTASONE) 5 MG tablet  Daily with breakfast     12/29/18 0949           Note:  This document was prepared using Dragon voice recognition software and may include unintentional dictation errors.    Johnn Hai, PA-C 12/29/18 [place]    Blake Divine, MD 12/30/18 [phone]

## 2018-12-29 NOTE — ED Triage Notes (Signed)
Pt sone reports his mom called him this am and told him that she could not walk and was hurting in her left hip. Pt denies injuries. Pt with alzheimers as well. No obvious injuries noted. Pt able to stand and step to wheelchair without  Trouble.

## 2018-12-29 NOTE — ED Notes (Signed)
See triage note  Presents with left lateral hip area  States she woke up with the pain this am  Per son she is able to walk but is leaning over and walking slower

## 2018-12-29 NOTE — ED Triage Notes (Signed)
Pt says it is below her hip but above her knee that hurts

## 2018-12-29 NOTE — Discharge Instructions (Signed)
Follow-up with your primary care provider if any continued problems.  The injection that you got today will start the steroid which will help with your left hip.  Tomorrow begin taking 1 tablet each day with breakfast for 7 days.  You may use ice or heat to your hip as needed for discomfort.  Use the walker when you are up walking for added support and protection to keep from falling.

## 2018-12-31 ENCOUNTER — Emergency Department: Payer: Medicare Other

## 2018-12-31 ENCOUNTER — Encounter: Payer: Self-pay | Admitting: Emergency Medicine

## 2018-12-31 ENCOUNTER — Other Ambulatory Visit: Payer: Self-pay

## 2018-12-31 ENCOUNTER — Emergency Department
Admission: EM | Admit: 2018-12-31 | Discharge: 2018-12-31 | Disposition: A | Discharge status: Home / Self Care | Payer: Medicare Other | Attending: Emergency Medicine | Admitting: Emergency Medicine

## 2018-12-31 DIAGNOSIS — M25552 Pain in left hip: Secondary | ICD-10-CM | POA: Diagnosis not present

## 2018-12-31 DIAGNOSIS — Z79899 Other long term (current) drug therapy: Secondary | ICD-10-CM | POA: Insufficient documentation

## 2018-12-31 DIAGNOSIS — Z8659 Personal history of other mental and behavioral disorders: Secondary | ICD-10-CM

## 2018-12-31 DIAGNOSIS — G309 Alzheimer's disease, unspecified: Secondary | ICD-10-CM | POA: Diagnosis not present

## 2018-12-31 IMAGING — CT CT HIP*L* W/O CM
2 of 3 series · 17 of 46 positions shown, 19 images · non-contrast
Comparison: X-ray [DATE]

CLINICAL DATA: Left hip pain

EXAM:
CT OF THE LEFT HIP WITHOUT CONTRAST
TECHNIQUE: Multidetector CT imaging of the left hip was performed according to
the standard protocol. Multiplanar CT image reconstructions were
also generated.

[Series 3: axial st · axial · 0.38mm/px · z∈[-199,-45]mm · 14 of 89 slices shown, 16 images]
[im 6/89  soft-tissue]
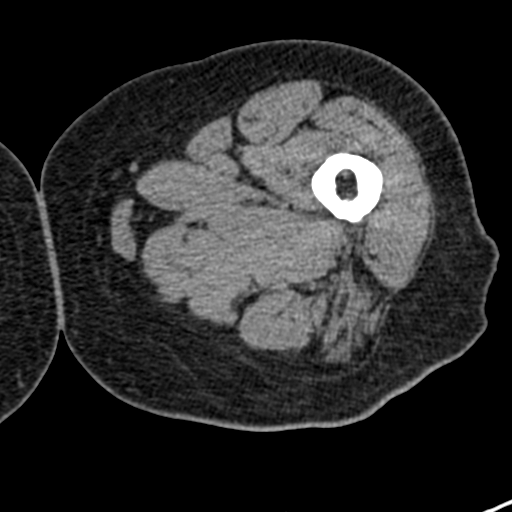
[im 6/89  bone]
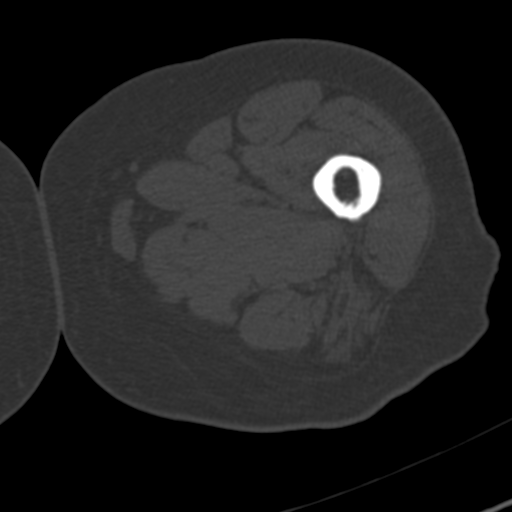
[im 12/89  soft-tissue]
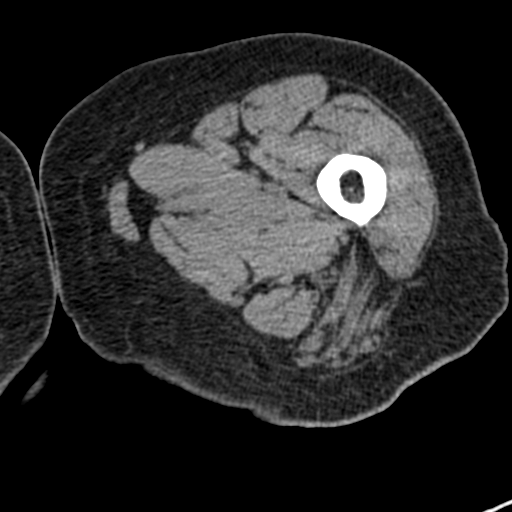
[im 18/89  soft-tissue]
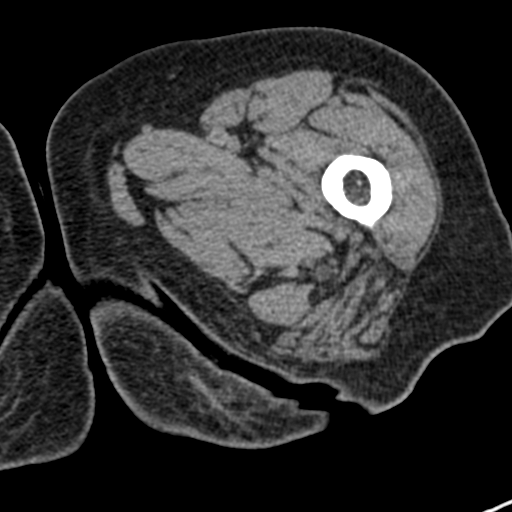
[im 23/89  soft-tissue]
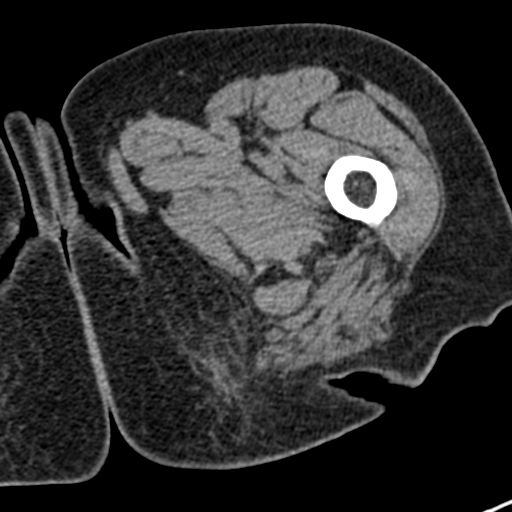
[im 29/89  soft-tissue]
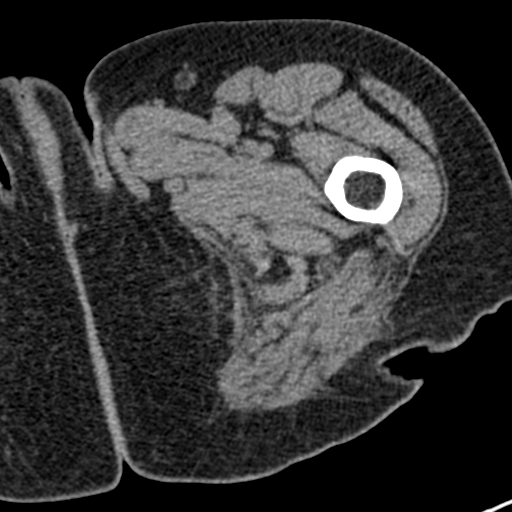
[im 35/89  soft-tissue]
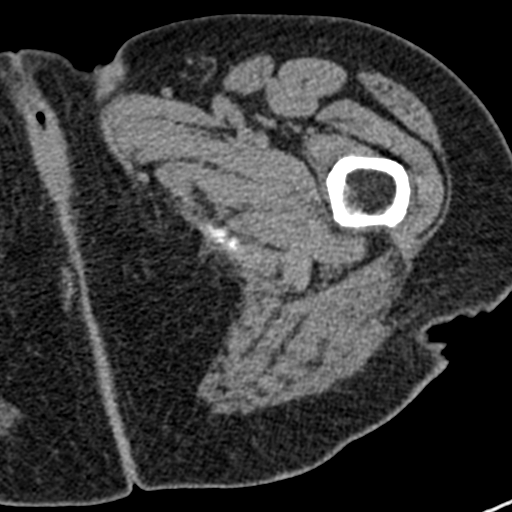
[im 40/89  soft-tissue]
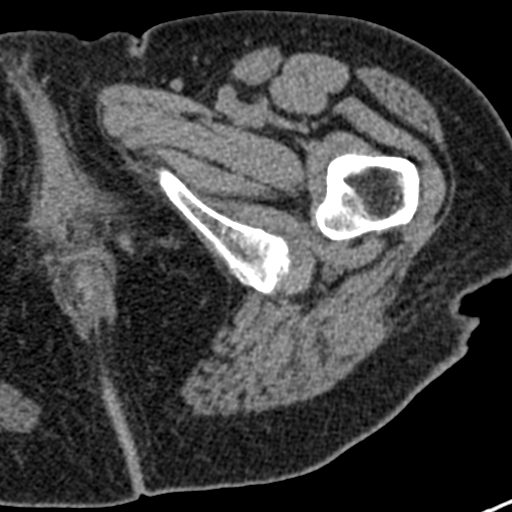
[im 49/89  soft-tissue]
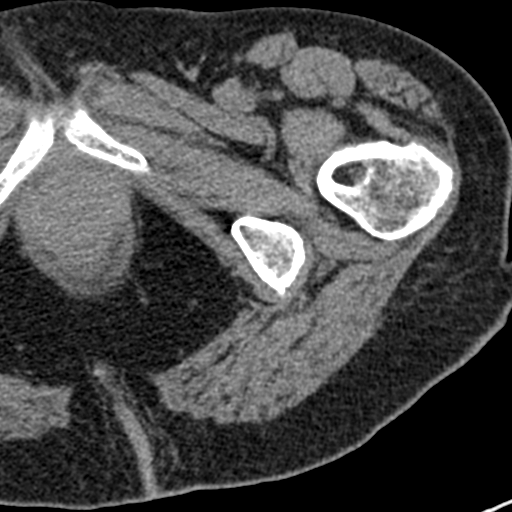
[im 54/89  soft-tissue]
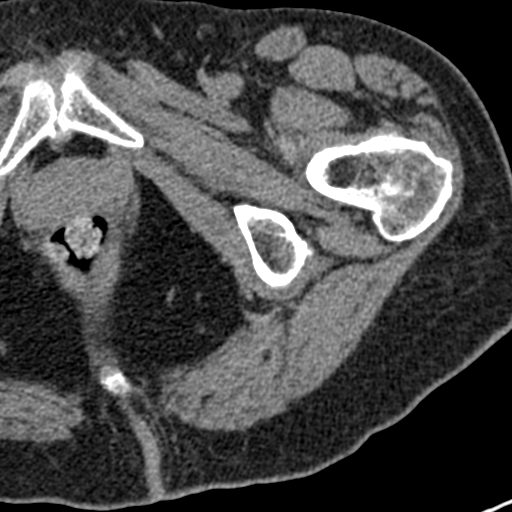
[im 54/89  bone]
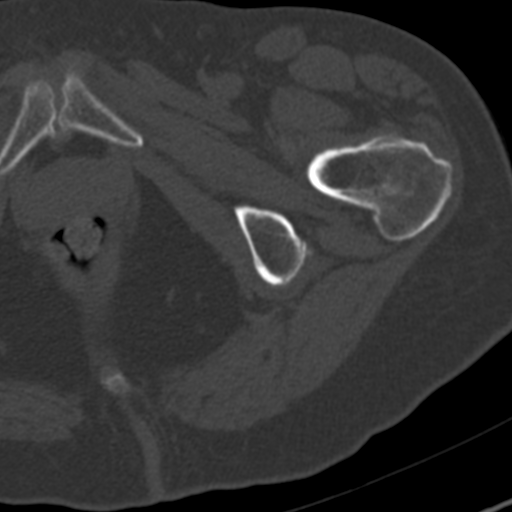
[im 60/89  soft-tissue]
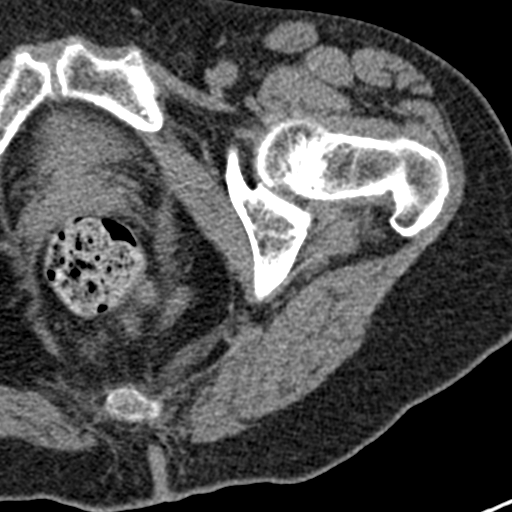
[im 66/89  soft-tissue]
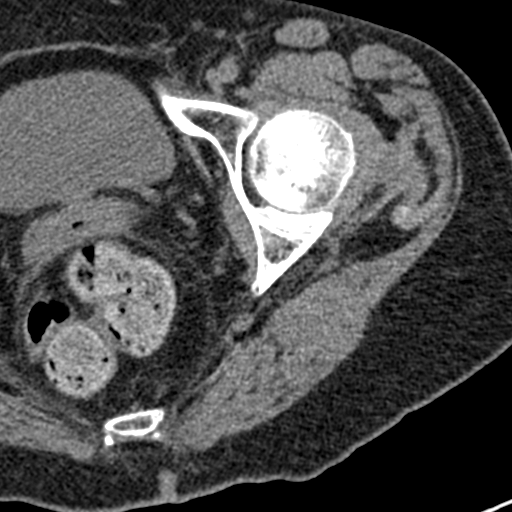
[im 71/89  soft-tissue]
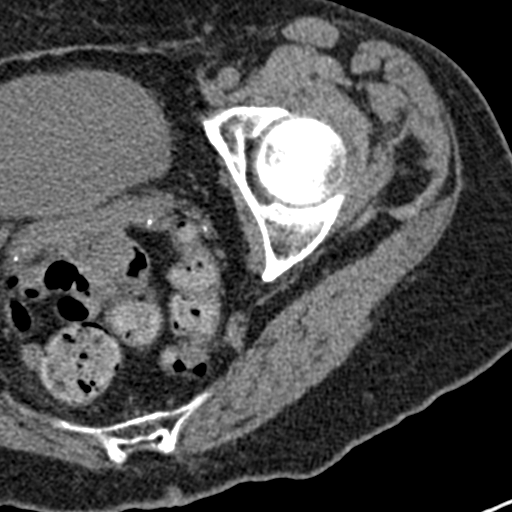
[im 77/89  soft-tissue]
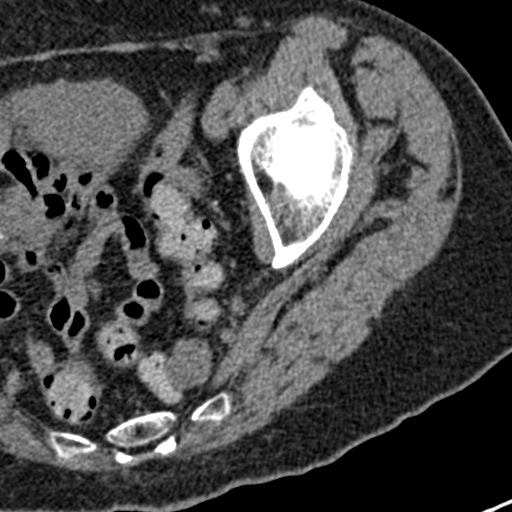
[im 83/89  soft-tissue]
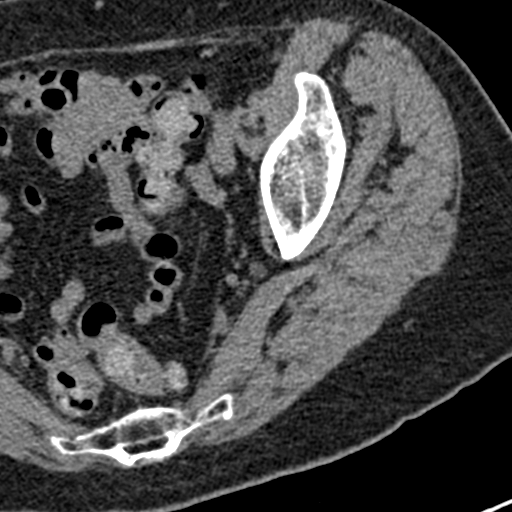

[Series 8: coronal st · coronal · 0.39mm/px · 3 of 91 slices shown]
[im 31/91  soft-tissue]
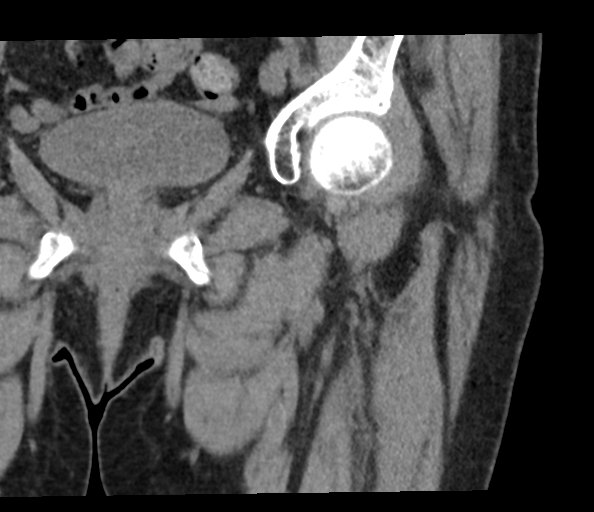
[im 41/91  soft-tissue]
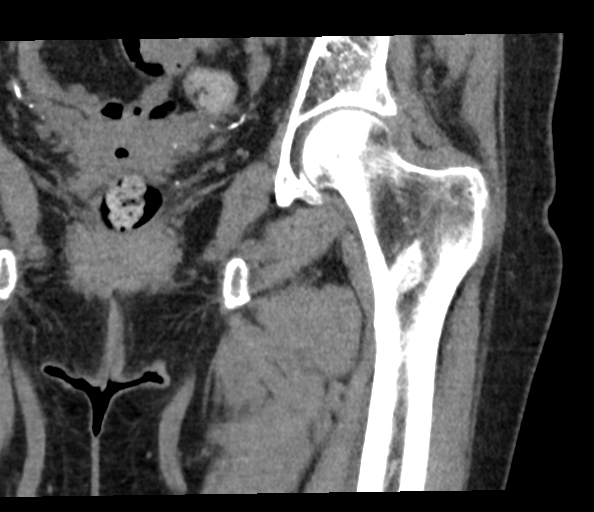
[im 51/91  soft-tissue]
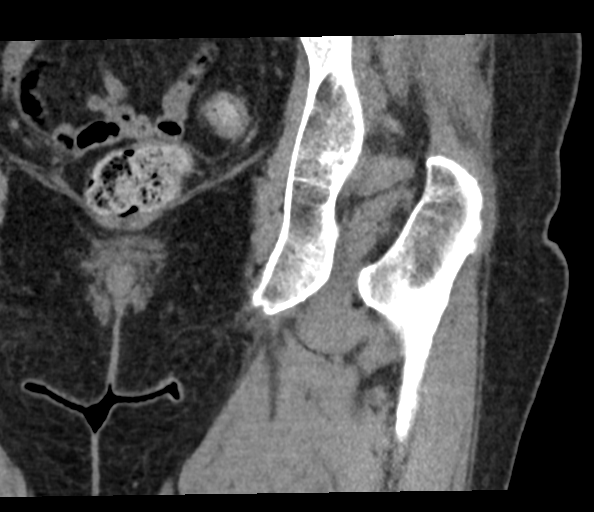

[17 of 46 positions shown; findings below may reference images not displayed]

FINDINGS: Bones/Joint/Cartilage

No acute fracture or dislocation. Left hip joint space is relatively
preserved. There is minimal subchondral sclerosis at the superior
acetabulum. Small subchondral cyst at the superomedial femoral head.
No evidence to suggest femoral head avascular necrosis. No visible
joint effusion.

Ligaments

Suboptimally assessed by CT.

Muscles and Tendons

No muscular or tendinous abnormality is evident.

Soft tissues

No soft tissue hematoma or fluid collection. Visualized intrapelvic
contents demonstrate no acute findings.
IMPRESSION: No acute osseous abnormality of the left hip.

## 2018-12-31 IMAGING — CR DG LUMBAR SPINE 2-3V
1 series · 3 of 3 positions shown · non-contrast
Comparison: None.

CLINICAL DATA: Back pain with left leg pain

EXAM:
LUMBAR SPINE - 2-3 VIEW

[Series 1: dg lumbar spine 2-3 views · 0.14mm/px · 3 of 3 slices shown]
[im 1/3]
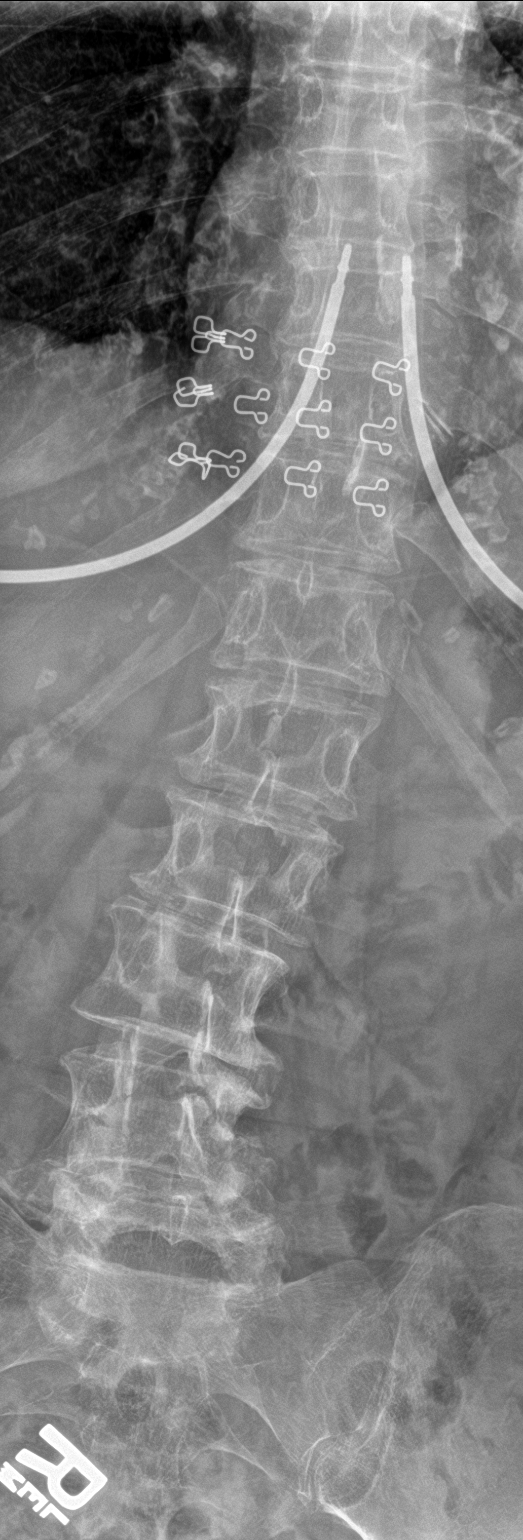
[im 2/3]
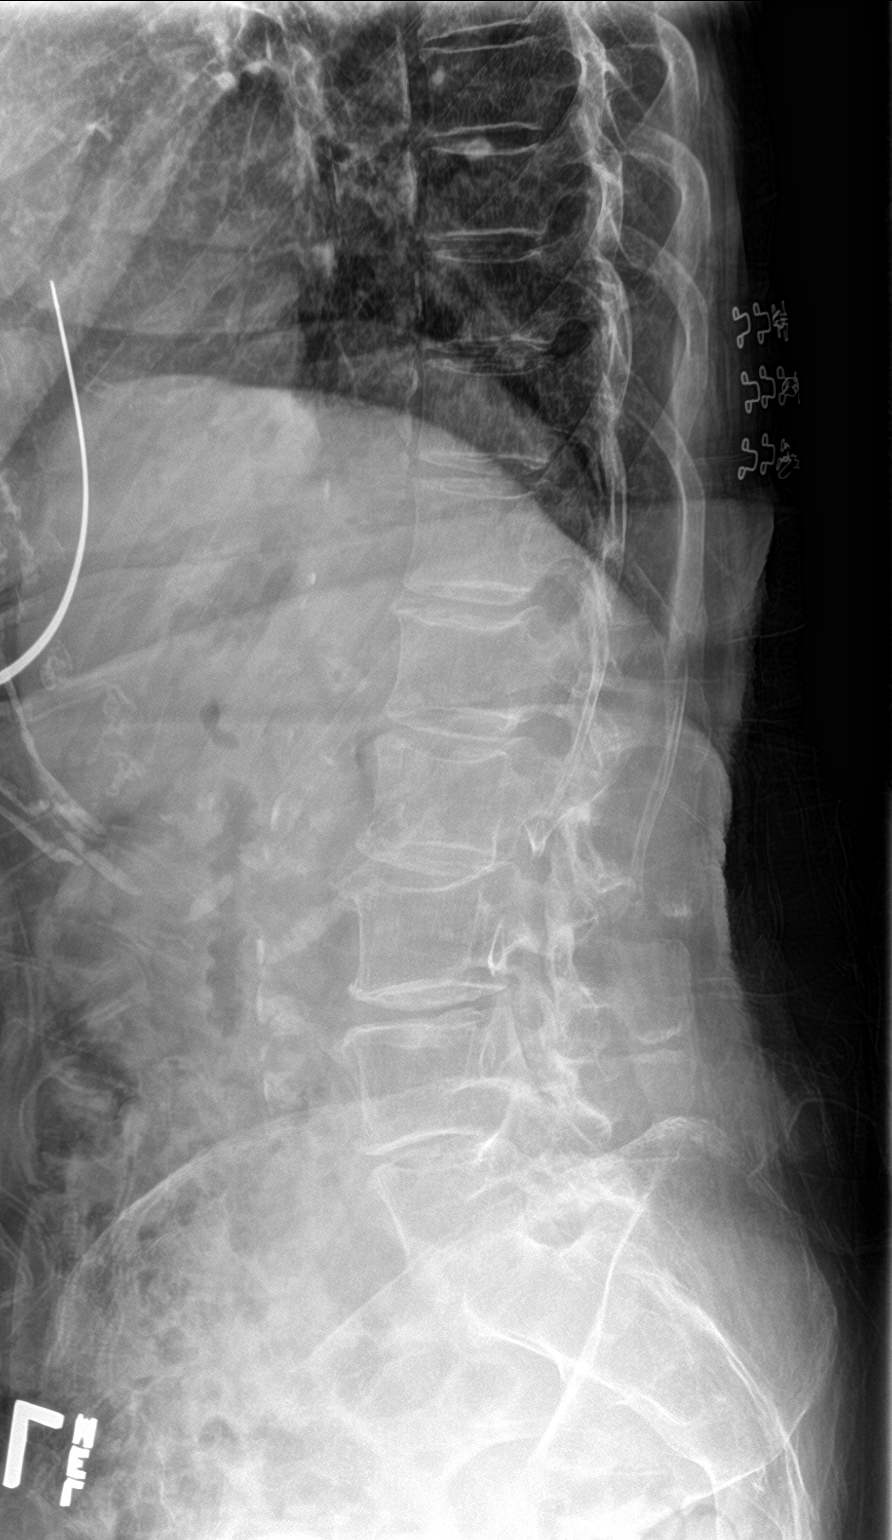
[im 3/3]
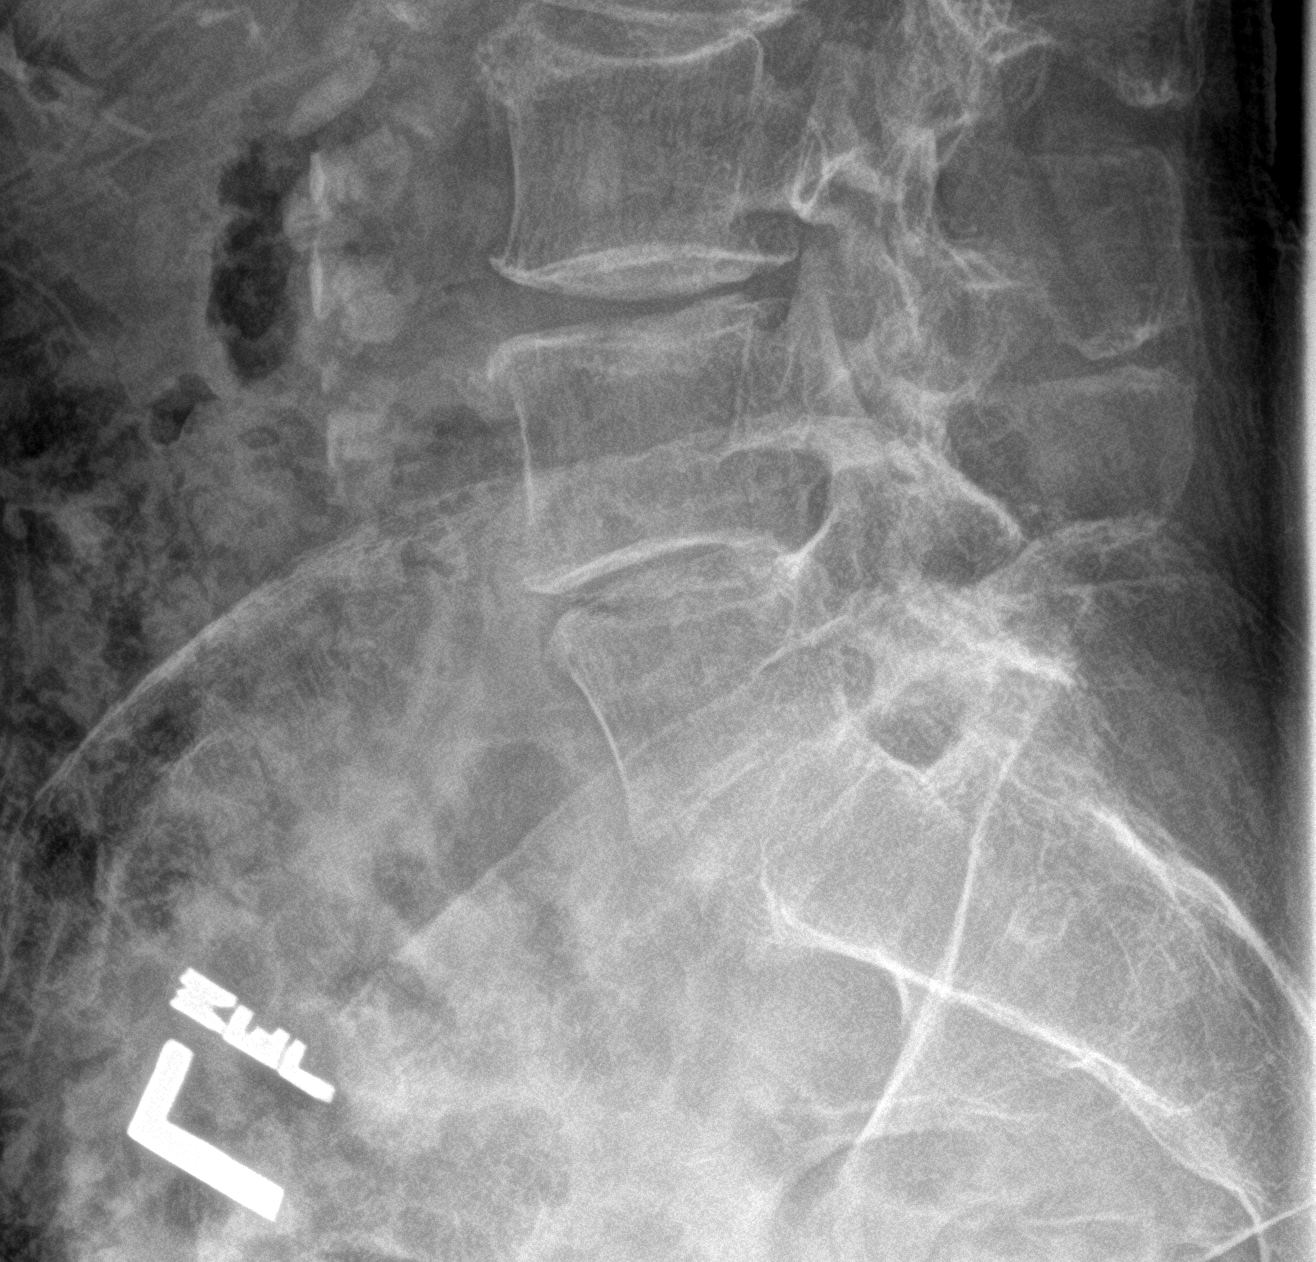

[3 of 3 positions shown; findings below may reference images not displayed]

FINDINGS: Moderate dextroscoliosis. Normal sagittal alignment. Negative for
fracture or mass.

Mild disc degeneration on the right at L2-3. Left disc degeneration
L3-4 L4-5. Osteopenia.

Mild atherosclerotic aorta.
IMPRESSION: Scoliosis and mild lumbar degenerative change. No acute abnormality.

## 2018-12-31 MED ORDER — LIDOCAINE 5 % EX PTCH: 1.0000 | MEDICATED_PATCH | Freq: Two times a day (BID) | CUTANEOUS | 0 refills | Status: DC

## 2018-12-31 MED ORDER — HYDROCODONE-ACETAMINOPHEN 5-325 MG PO TABS
1.0000 | ORAL_TABLET | Freq: Once | ORAL | Status: AC
  Administered 2018-12-31: 1 via ORAL
  Filled 2018-12-31: qty 1

## 2018-12-31 MED ORDER — LIDOCAINE 5 % EX PTCH
1.0000 | MEDICATED_PATCH | CUTANEOUS | Status: DC
  Administered 2018-12-31: 1 via TRANSDERMAL
  Filled 2018-12-31: qty 1

## 2018-12-31 MED ORDER — HYDROCODONE-ACETAMINOPHEN 5-325 MG PO TABS: 1.0000 | ORAL_TABLET | ORAL | 0 refills | Status: DC | PRN

## 2018-12-31 NOTE — ED Provider Notes (Signed)
[facility] Emergency Department Provider Note   ____________________________________________   First MD Initiated Contact with Patient 12/31/18 1524     (approximate)  I have reviewed the triage vital signs and the nursing notes.   HISTORY  Chief Complaint Hip Pain    HPI Dana Love is a 85 y.o. female with past medical history of Alzheimer's dementia who presents to the ED for hip pain.  Patient reports that she has been dealing with worsening pain in her left hip for about the past week.  She denies any trauma prior to the onset of pain, has not had any recent falls.  She has not noticed any fevers or swelling to the joint.  She has been able to ambulate, but this exacerbates her symptoms.  She denies any pain in her back and has not had any numbness or weakness in her lower extremities.  She has been taking Tylenol without relief.  She was seen for this problem in the ED earlier in the week, with negative x-ray.        Past Medical History:  Diagnosis Date  . Alzheimer disease (HCC)     There are no active problems to display for this patient.   History reviewed. No pertinent surgical history.  Prior to Admission medications   Medication Sig Start Date End Date Taking? Authorizing Provider  atorvastatin (LIPITOR) 10 MG tablet Take 10 mg by mouth daily.    [provider]  donepezil (ARICEPT) 10 MG tablet Take 10 mg by mouth at bedtime.    [provider]  HYDROcodone-acetaminophen (NORCO/VICODIN) 5-325 MG tablet Take 1 tablet by mouth every 4 (four) hours as needed for moderate pain. 12/31/18 12/31/19  Chesley Noon, MD  lidocaine (LIDODERM) 5 % Place 1 patch onto the skin every 12 (twelve) hours. Remove & Discard patch within 12 hours or as directed by MD 12/31/18 12/31/19  Chesley Noon, MD  predniSONE (DELTASONE) 5 MG tablet Take 1 tablet (5 mg total) by mouth daily with breakfast. 12/29/18   Tommi Rumps, PA-C   raloxifene (EVISTA) 60 MG tablet Take 60 mg by mouth daily.    [provider]  sertraline (ZOLOFT) 25 MG tablet Take 25 mg by mouth daily.    [provider]  vitamin B-12 (CYANOCOBALAMIN) 500 MCG tablet Take 500 mcg by mouth daily.    [provider]    Allergies Patient has no known allergies.  History reviewed. No pertinent family history.  Social History Social History   Tobacco Use  . Smoking status: Never Smoker  . Smokeless tobacco: Never Used  Substance Use Topics  . Alcohol use: Never    Frequency: Never  . Drug use: Never    Review of Systems  Constitutional: No fever/chills Eyes: No visual changes. ENT: No sore throat. Cardiovascular: Denies chest pain. Respiratory: Denies shortness of breath. Gastrointestinal: No abdominal pain.  No nausea, no vomiting.  No diarrhea.  No constipation. Genitourinary: Negative for dysuria. Musculoskeletal: Negative for back pain.  Positive for hip pain. Skin: Negative for rash. Neurological: Negative for headaches, focal weakness or numbness.  ____________________________________________   PHYSICAL EXAM:  VITAL SIGNS: ED Triage Vitals  Enc Vitals Group     BP 12/31/18 1327 (!) 170/73     Pulse Rate 12/31/18 1327 67     Resp 12/31/18 1333 18     Temp 12/31/18 1327 97.7 F (36.5 C)     Temp Source 12/31/18 1327 Oral  SpO2 12/31/18 1327 98 %     Weight 12/31/18 1328 110 lb (49.9 kg)     Height 12/31/18 1328 5\' 1"  (1.549 m)     Head Circumference --      Peak Flow --      Pain Score 12/31/18 1333 9     Pain Loc --      Pain Edu? --      Excl. in [place]? --     Constitutional: Alert and oriented. Eyes: Conjunctivae are normal. Head: Atraumatic. Nose: No congestion/rhinnorhea. Mouth/Throat: Mucous membranes are moist. Neck: Normal ROM Cardiovascular: Normal rate, regular rhythm. Grossly normal heart sounds. Respiratory: Normal respiratory effort.  No retractions. Lungs CTAB.  Gastrointestinal: Soft and nontender. No distention. Genitourinary: deferred Musculoskeletal: No lower extremity tenderness nor edema.  Diffuse tenderness to left hip, no midline lumbar spinal tenderness. Neurologic:  Normal speech and language. No gross focal neurologic deficits are appreciated. Skin:  Skin is warm, dry and intact. No rash noted. Psychiatric: Mood and affect are normal. Speech and behavior are normal.  ____________________________________________   LABS (all labs ordered are listed, but only abnormal results are displayed)  Labs Reviewed - No data to display   PROCEDURES  Procedure(s) performed (including Critical Care):  Procedures   ____________________________________________   INITIAL IMPRESSION / ASSESSMENT AND PLAN / ED COURSE       85 year old female with history of Alzheimer dementia presents to the ED with worsening atraumatic pain to her left hip.  There is no swelling, redness, fever, or limitation in range of motion suggestive of septic arthritis.  CT of left hip was obtained and negative for acute process, x-ray also obtained of lumbar spine and this is negative.  She is neurovascularly intact to her bilateral lower extremities.  Pain is improved following symptomatic treatment, counseled patient to continue this and to follow-up with her PCP for possible physical therapy.  Patient and son agree with plan.      ____________________________________________   FINAL CLINICAL IMPRESSION(S) / ED DIAGNOSES  Final diagnoses:  Left hip pain  History of dementia     ED Discharge Orders         Ordered    HYDROcodone-acetaminophen (NORCO/VICODIN) 5-325 MG tablet  Every 4 hours PRN     12/31/18 1725    lidocaine (LIDODERM) 5 %  Every 12 hours     12/31/18 1725           Note:  This document was prepared using Dragon voice recognition software and may include unintentional dictation errors.   Blake Divine, MD 01/01/19 [phone]

## 2018-12-31 NOTE — ED Notes (Signed)
Patient transported to X-ray 

## 2018-12-31 NOTE — ED Notes (Signed)
Pt ambulated to toilet in room to urinate and back to bed with assistance from nt.

## 2018-12-31 NOTE — ED Triage Notes (Signed)
Here for left hip pain.  No fall or known injury. Here Monday for same. Has taken the meds she was given but no improvement. Able to ambulate but with difficulty. Son with pt r/t dementia.  Pt has pain when pressure applied to left hip.

## 2019-09-08 ENCOUNTER — Other Ambulatory Visit: Payer: Self-pay

## 2019-09-08 ENCOUNTER — Inpatient Hospital Stay
Admission: EM | Admit: 2019-09-08 | Discharge: 2019-09-13 | DRG: 482 | Disposition: A | Discharge status: Skilled Nursing Facility | Payer: Medicare PPO | Attending: Internal Medicine | Admitting: Internal Medicine

## 2019-09-08 ENCOUNTER — Emergency Department: Payer: Medicare PPO

## 2019-09-08 ENCOUNTER — Encounter: Payer: Self-pay | Admitting: Psychiatry

## 2019-09-08 DIAGNOSIS — W19XXXA Unspecified fall, initial encounter: Secondary | ICD-10-CM

## 2019-09-08 DIAGNOSIS — Z79899 Other long term (current) drug therapy: Secondary | ICD-10-CM | POA: Diagnosis not present

## 2019-09-08 DIAGNOSIS — G301 Alzheimer's disease with late onset: Secondary | ICD-10-CM

## 2019-09-08 DIAGNOSIS — Y9301 Activity, walking, marching and hiking: Secondary | ICD-10-CM | POA: Diagnosis present

## 2019-09-08 DIAGNOSIS — W010XXA Fall on same level from slipping, tripping and stumbling without subsequent striking against object, initial encounter: Secondary | ICD-10-CM | POA: Diagnosis present

## 2019-09-08 DIAGNOSIS — S72009A Fracture of unspecified part of neck of unspecified femur, initial encounter for closed fracture: Secondary | ICD-10-CM

## 2019-09-08 DIAGNOSIS — G309 Alzheimer's disease, unspecified: Secondary | ICD-10-CM | POA: Diagnosis present

## 2019-09-08 DIAGNOSIS — S72001D Fracture of unspecified part of neck of right femur, subsequent encounter for closed fracture with routine healing: Secondary | ICD-10-CM | POA: Diagnosis not present

## 2019-09-08 DIAGNOSIS — F0280 Dementia in other diseases classified elsewhere without behavioral disturbance: Secondary | ICD-10-CM | POA: Diagnosis present

## 2019-09-08 DIAGNOSIS — Z20822 Contact with and (suspected) exposure to covid-19: Secondary | ICD-10-CM | POA: Diagnosis present

## 2019-09-08 DIAGNOSIS — E785 Hyperlipidemia, unspecified: Secondary | ICD-10-CM | POA: Diagnosis present

## 2019-09-08 DIAGNOSIS — S72001A Fracture of unspecified part of neck of right femur, initial encounter for closed fracture: Secondary | ICD-10-CM | POA: Diagnosis present

## 2019-09-08 DIAGNOSIS — Z419 Encounter for procedure for purposes other than remedying health state, unspecified: Secondary | ICD-10-CM

## 2019-09-08 DIAGNOSIS — R030 Elevated blood-pressure reading, without diagnosis of hypertension: Secondary | ICD-10-CM | POA: Diagnosis present

## 2019-09-08 LAB — COMPREHENSIVE METABOLIC PANEL
ALT: 15 U/L (ref 0–44)
AST: 21 U/L (ref 15–41)
Albumin: 3.3 g/dL — ABNORMAL LOW (ref 3.5–5.0)
Alkaline Phosphatase: 37 U/L — ABNORMAL LOW (ref 38–126)
Anion gap: 6 (ref 5–15)
BUN: 16 mg/dL (ref 8–23)
CO2: 25 mmol/L (ref 22–32)
Calcium: 7.5 mg/dL — ABNORMAL LOW (ref 8.9–10.3)
Chloride: 106 mmol/L (ref 98–111)
Creatinine, Ser: 0.7 mg/dL (ref 0.44–1.00)
GFR calc Af Amer: >60 mL/min (ref >60)
GFR calc non Af Amer: >60 mL/min (ref >60)
Glucose, Bld: 100 mg/dL — ABNORMAL HIGH (ref 70–99)
Potassium: 3.6 mmol/L (ref 3.5–5.1)
Sodium: 137 mmol/L (ref 135–145)
Total Bilirubin: 0.6 mg/dL (ref 0.3–1.2)
Total Protein: 5.8 g/dL — ABNORMAL LOW (ref 6.5–8.1)

## 2019-09-08 LAB — SARS CORONAVIRUS 2 BY RT PCR (HOSPITAL ORDER, PERFORMED IN ~~LOC~~ HOSPITAL LAB): SARS Coronavirus 2: NEGATIVE

## 2019-09-08 LAB — CBC WITH DIFFERENTIAL/PLATELET
Abs Immature Granulocytes: 0.05 10*3/uL (ref 0.00–0.07)
Basophils Absolute: 0 10*3/uL (ref 0.0–0.1)
Basophils Relative: 1 %
Eosinophils Absolute: 0.1 10*3/uL (ref 0.0–0.5)
Eosinophils Relative: 1 %
HCT: 38.3 % (ref 36.0–46.0)
Hemoglobin: 13 g/dL (ref 12.0–15.0)
Immature Granulocytes: 1 %
Lymphocytes Relative: 17 %
Lymphs Abs: 1.5 10*3/uL (ref 0.7–4.0)
MCH: 30.9 pg (ref 26.0–34.0)
MCHC: 33.9 g/dL (ref 30.0–36.0)
MCV: 91 fL (ref 80.0–100.0)
Monocytes Absolute: 0.6 10*3/uL (ref 0.1–1.0)
Monocytes Relative: 7 %
Neutro Abs: 6.4 10*3/uL (ref 1.7–7.7)
Neutrophils Relative %: 73 %
Platelets: 143 10*3/uL — ABNORMAL LOW (ref 150–400)
RBC: 4.21 MIL/uL (ref 3.87–5.11)
RDW: 13.8 % (ref 11.5–15.5)
WBC: 8.6 10*3/uL (ref 4.0–10.5)
nRBC: 0 % (ref 0.0–0.2)

## 2019-09-08 LAB — SURGICAL PCR SCREEN
MRSA, PCR: POSITIVE — AB
Staphylococcus aureus: POSITIVE — AB

## 2019-09-08 LAB — TYPE AND SCREEN
ABO/RH(D): A POS
Antibody Screen: NEGATIVE

## 2019-09-08 IMAGING — CR DG HIP (WITH OR WITHOUT PELVIS) 2-3V*R*
3 series · 3 of 3 positions shown · non-contrast
Comparison: None.

CLINICAL DATA: 87-year-old female with fall and right hip pain.

EXAM:
DG HIP (WITH OR WITHOUT PELVIS) 2-3V RIGHT

[pelvis ap]
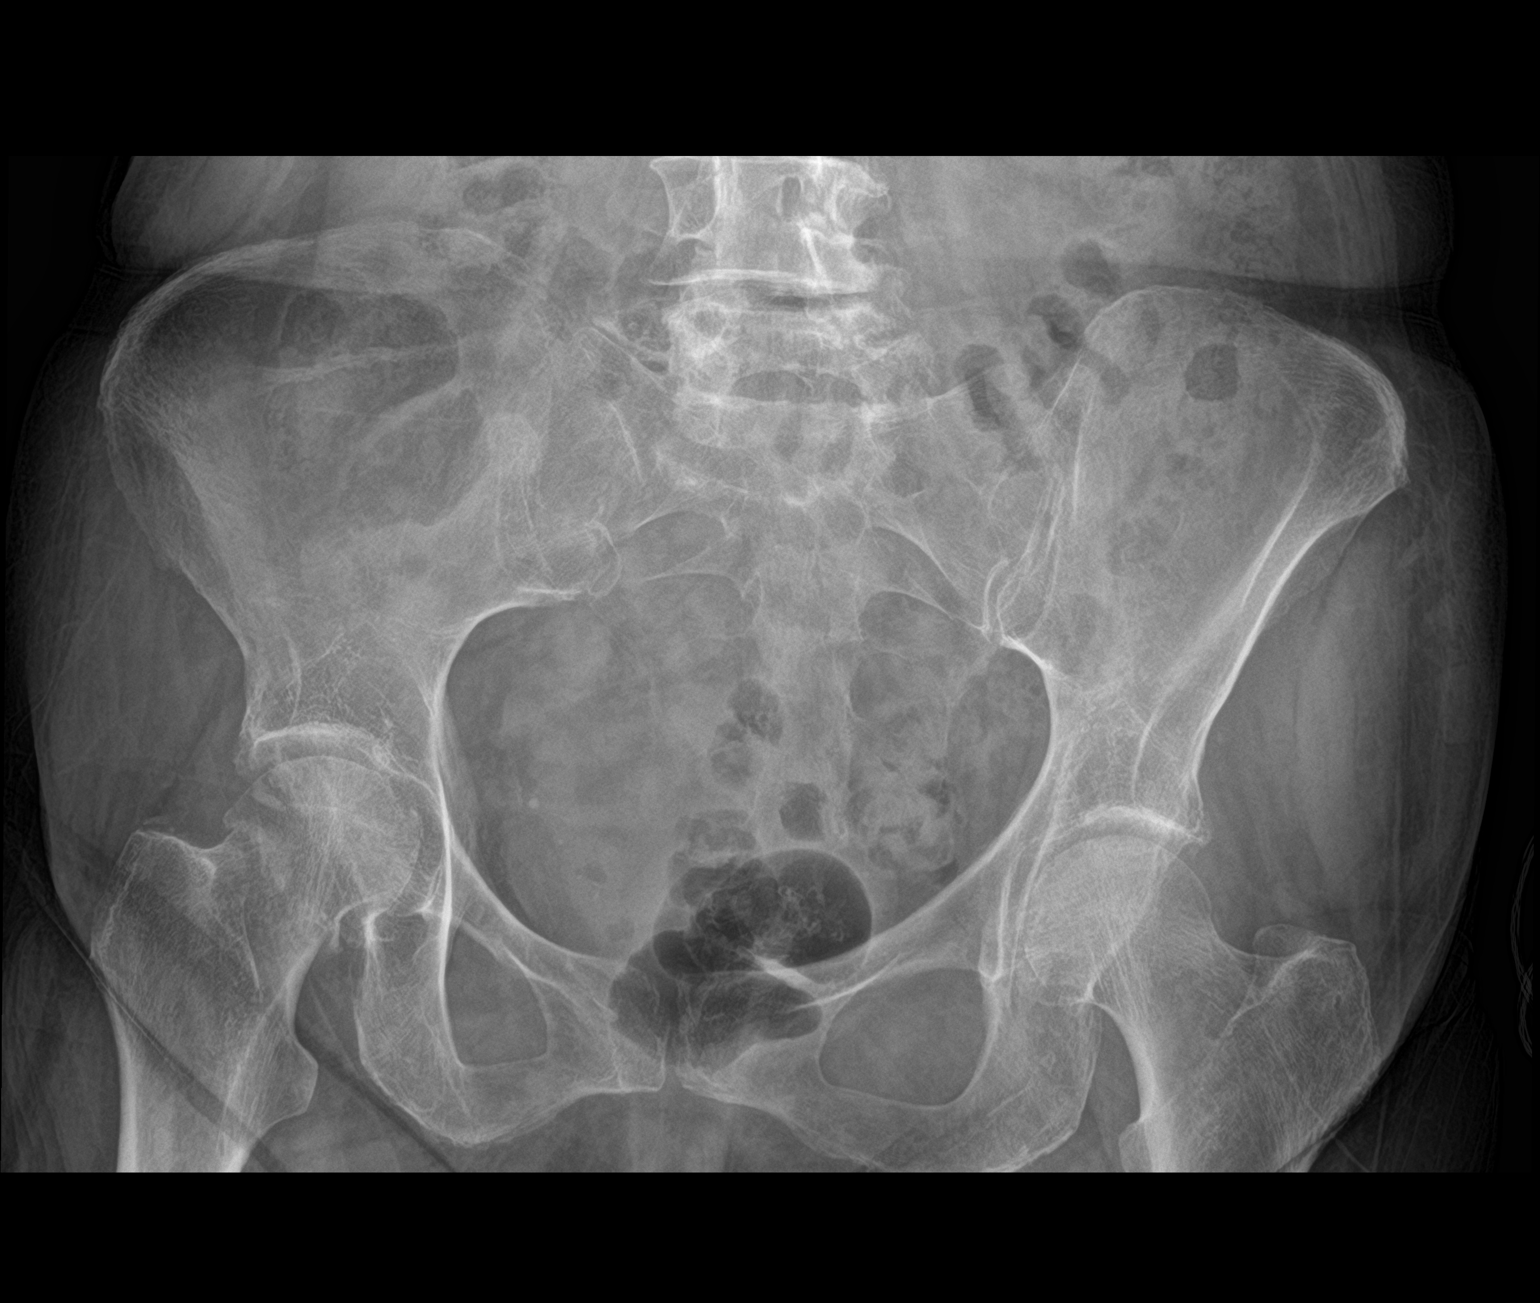

[hip ap]
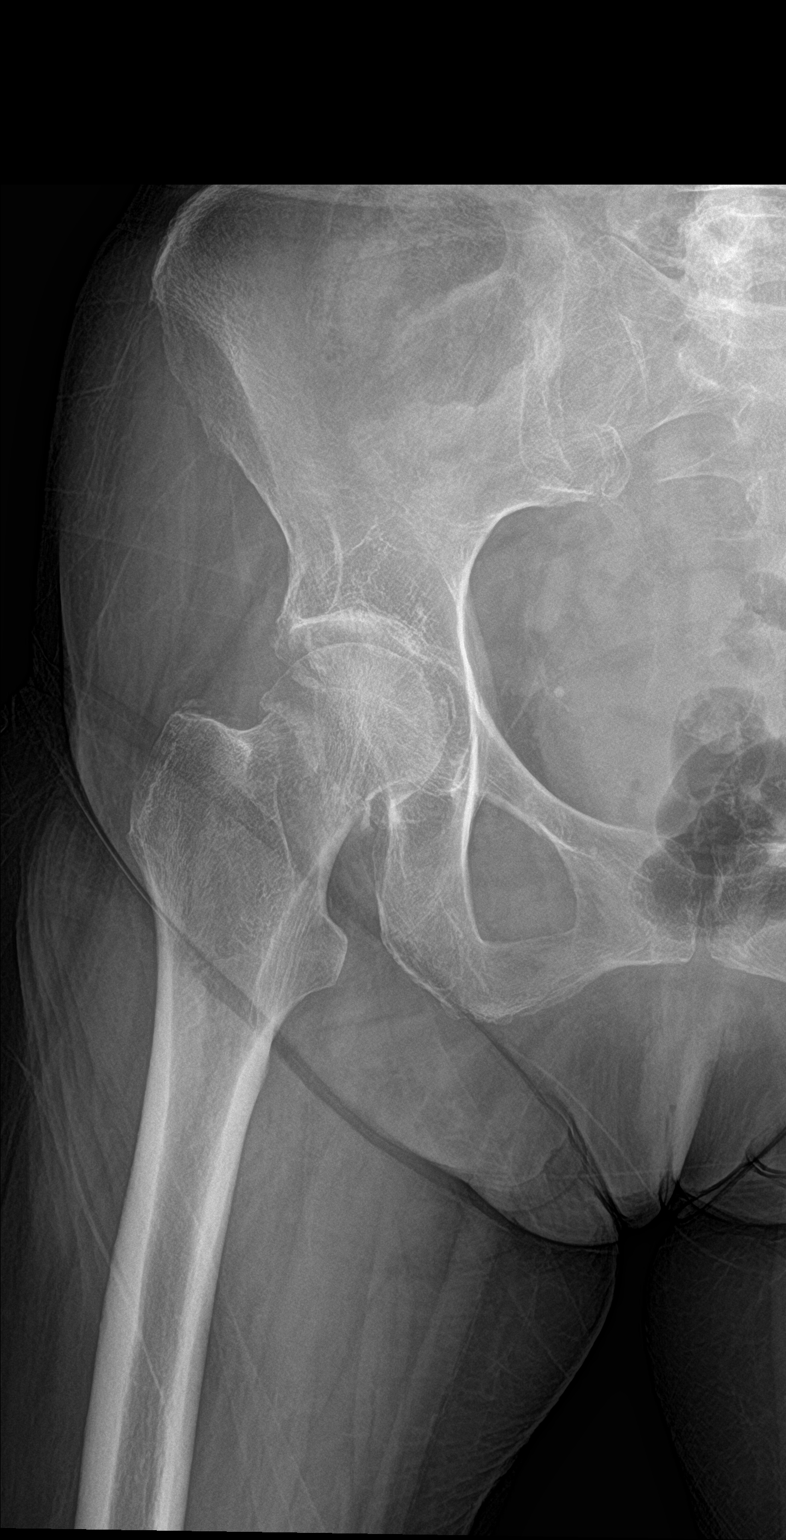

[hip lat]
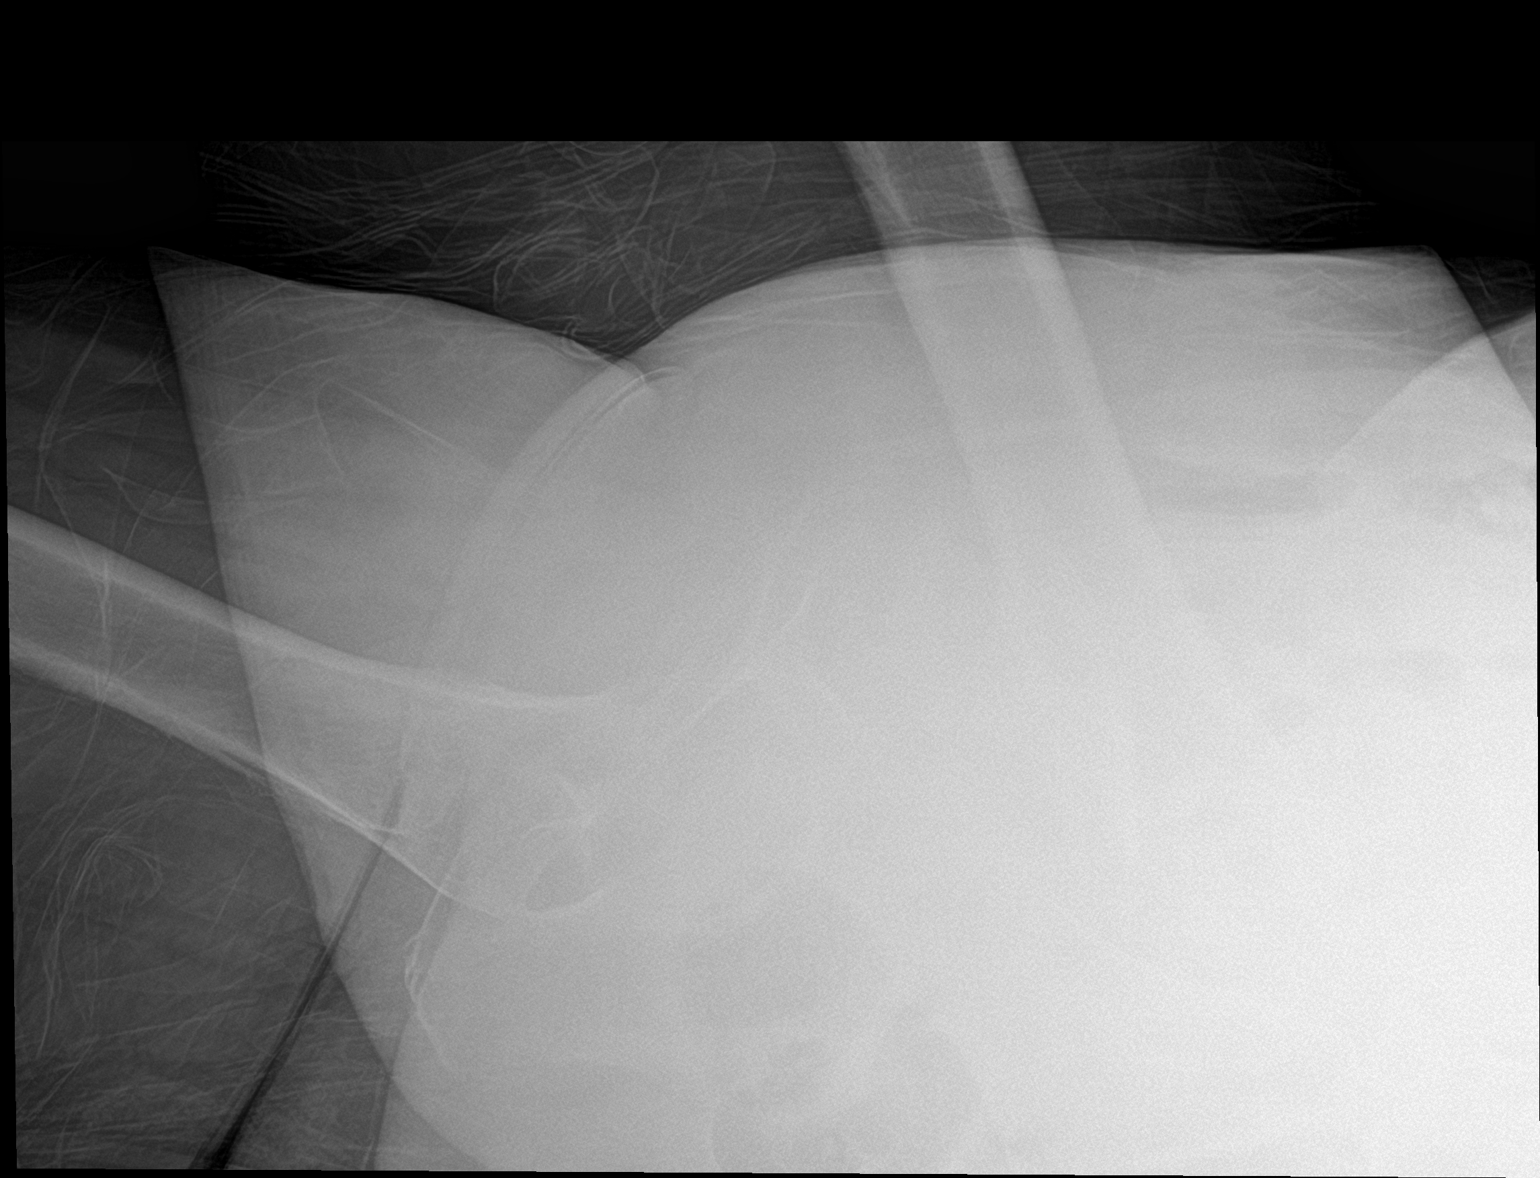

[3 of 3 positions shown; findings below may reference images not displayed]

FINDINGS: Mildly impacted fracture of the neck of the right femur. There is no
dislocation. The bones are osteopenic. There is mild bilateral hip
arthritic changes. Degenerative changes of the lower lumbar spine.
The soft tissues are unremarkable.
IMPRESSION: Mildly impacted fracture of the right femoral neck.

## 2019-09-08 IMAGING — CR DG CHEST 1V PORT
1 series · 1 of 1 positions shown · non-contrast
Comparison: None.

CLINICAL DATA: The patient suffered a trip and fall over a shoe
string today.

EXAM:
PORTABLE CHEST 1 VIEW

[chest ap]
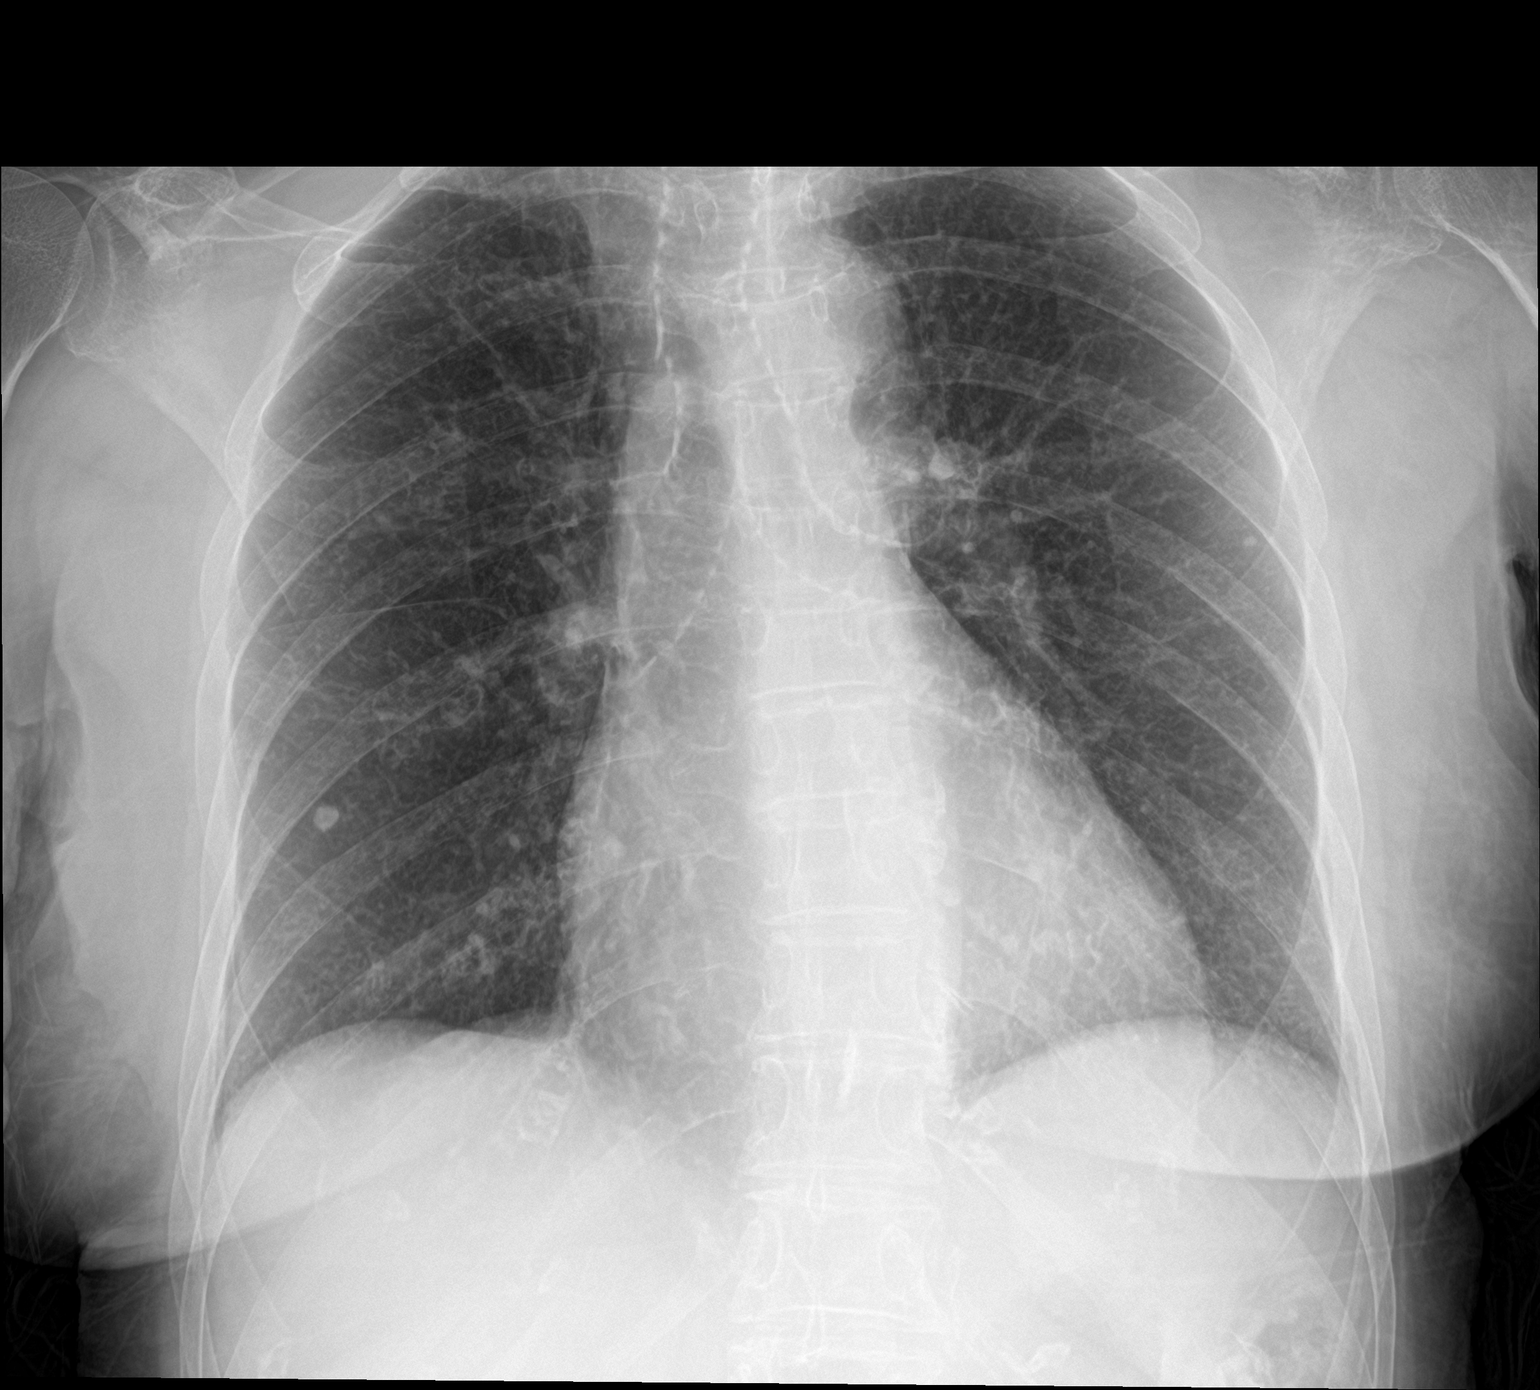

[1 of 1 positions shown; findings below may reference images not displayed]

FINDINGS: Single, small calcified granulomas are seen in both the right and
left chest. Lungs otherwise clear. Heart size normal. No
pneumothorax or pleural effusion. No acute or focal bony
abnormality.
IMPRESSION: No acute disease.

## 2019-09-08 IMAGING — CT CT HEAD W/O CM
3 series · 15 of 47 positions shown, 18 images · non-contrast
Comparison: Correlation made with MRI from [7F]

CLINICAL DATA: Fall

EXAM:
CT HEAD WITHOUT CONTRAST
TECHNIQUE: Contiguous axial images were obtained from the base of the skull
through the vertex without intravenous contrast.

[Series 2: head wo · axial · 0.44mm/px · z∈[+478,+603]mm · 9 of 30 slices shown, 12 images]
[im 3/30  brain]
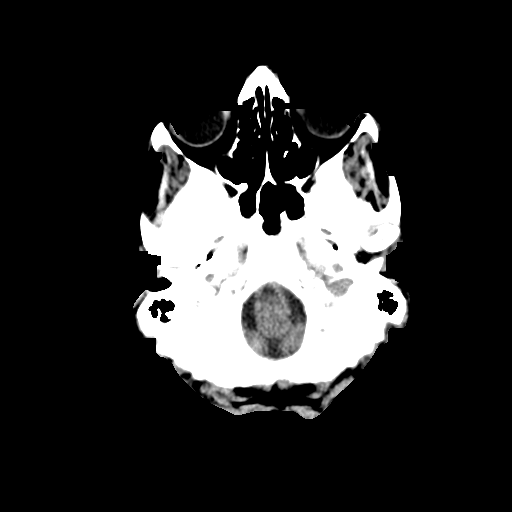
[im 3/30  bone]
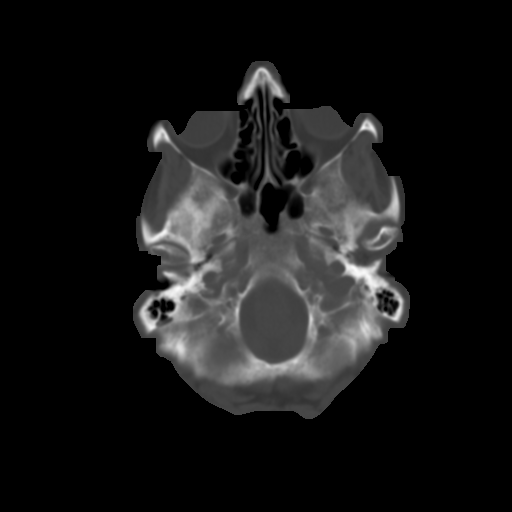
[im 6/30  brain]
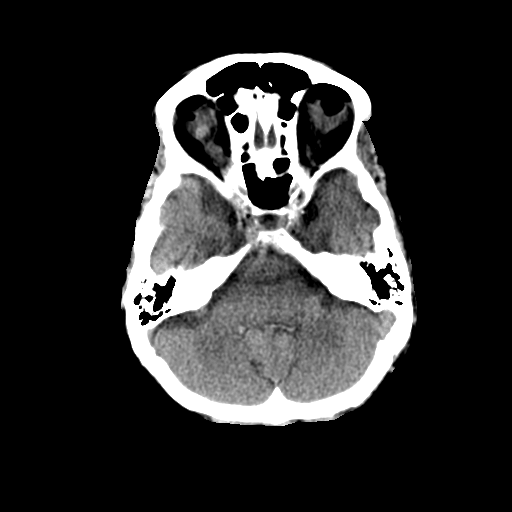
[im 9/30  brain]
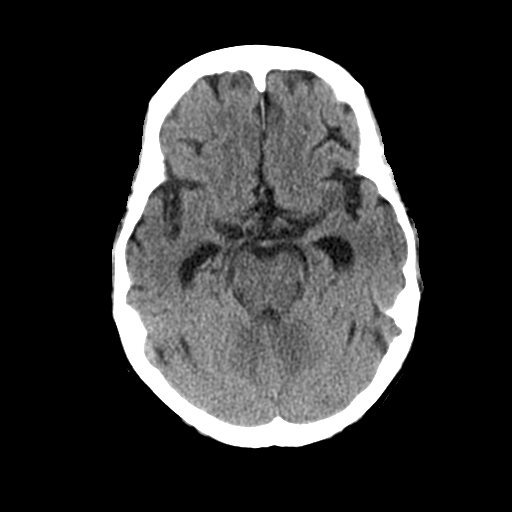
[im 12/30  brain]
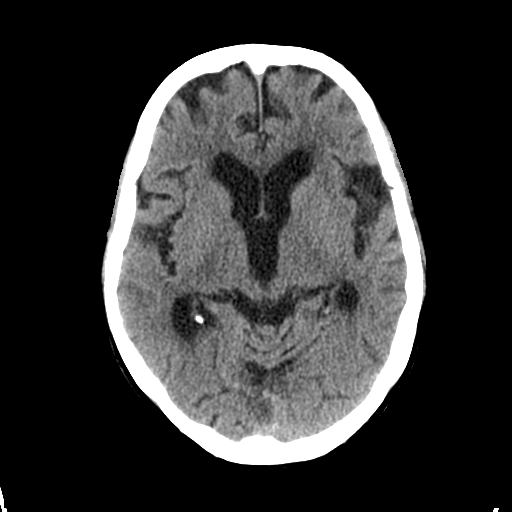
[im 16/30  brain]
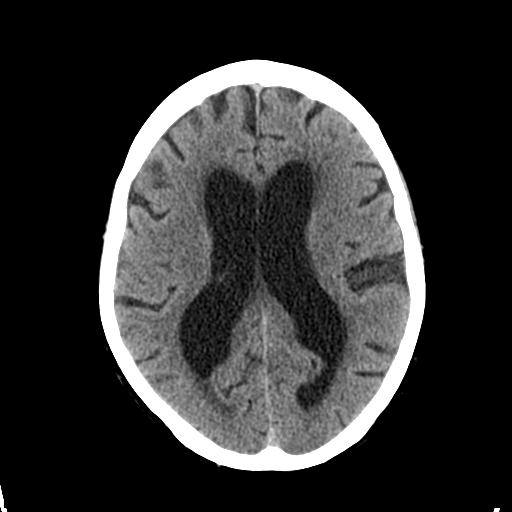
[im 16/30  bone]
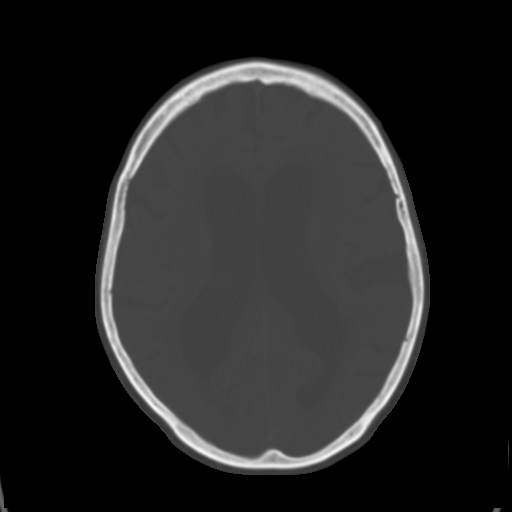
[im 19/30  brain]
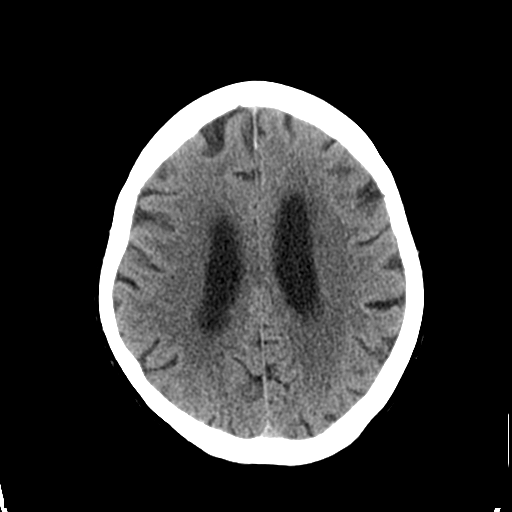
[im 22/30  brain]
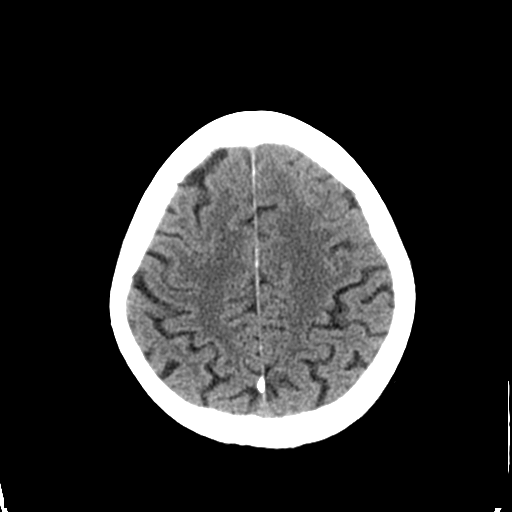
[im 25/30  brain]
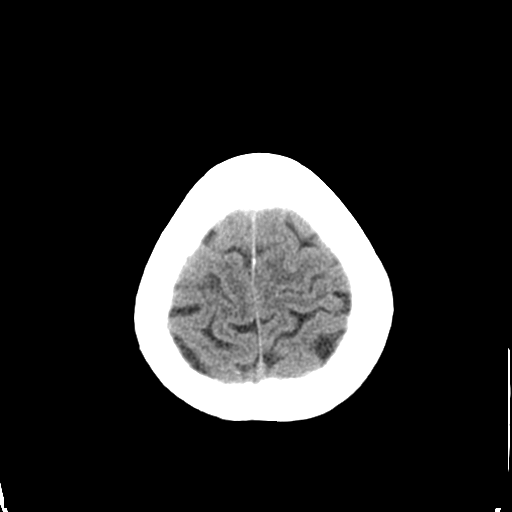
[im 28/30  brain]
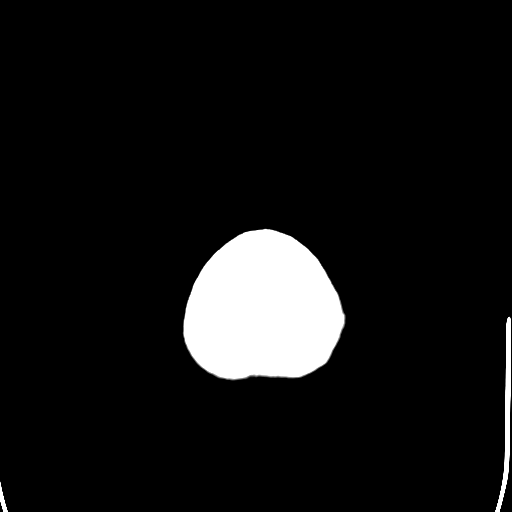
[im 28/30  bone]
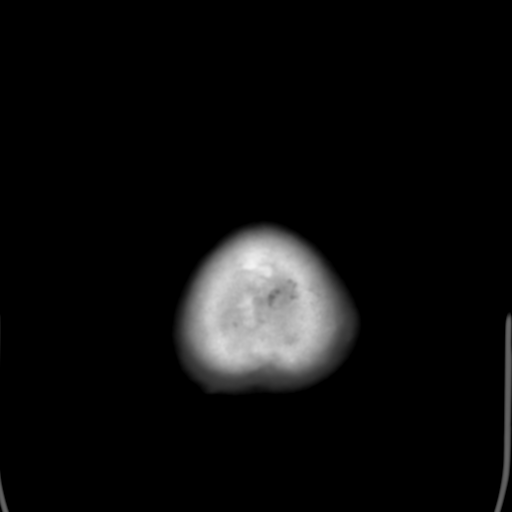

[Series 4: coronal soft tissue · coronal · 0.29mm/px · 3 of 63 slices shown]
[im 21/63  brain]
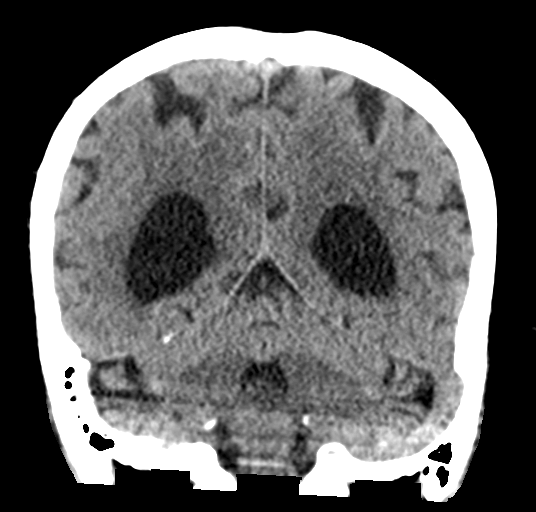
[im 28/63  brain]
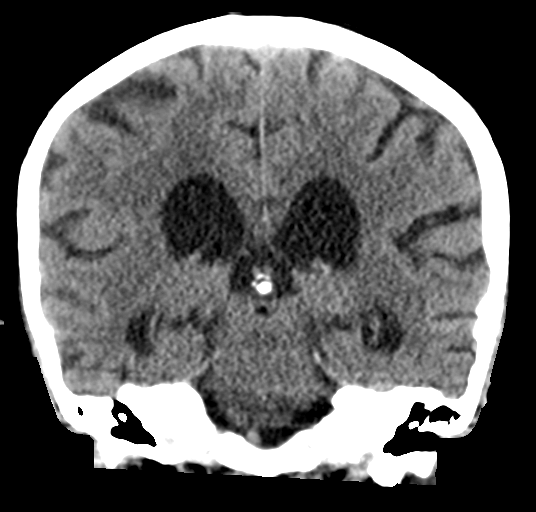
[im 35/63  brain]
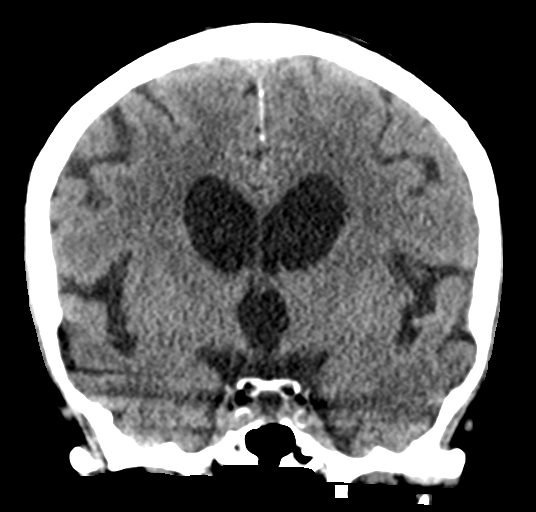

[Series 5: sagittal soft tissue · sagittal · 0.29mm/px · 3 of 49 slices shown]
[im 17/49  brain]
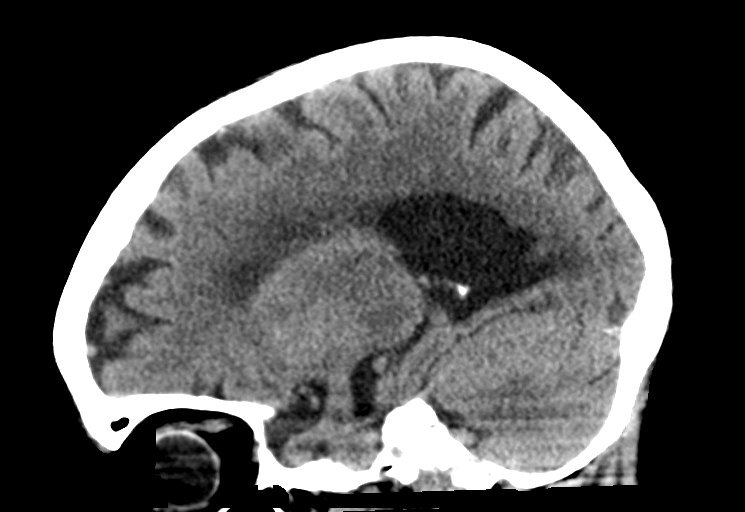
[im 25/49  brain]
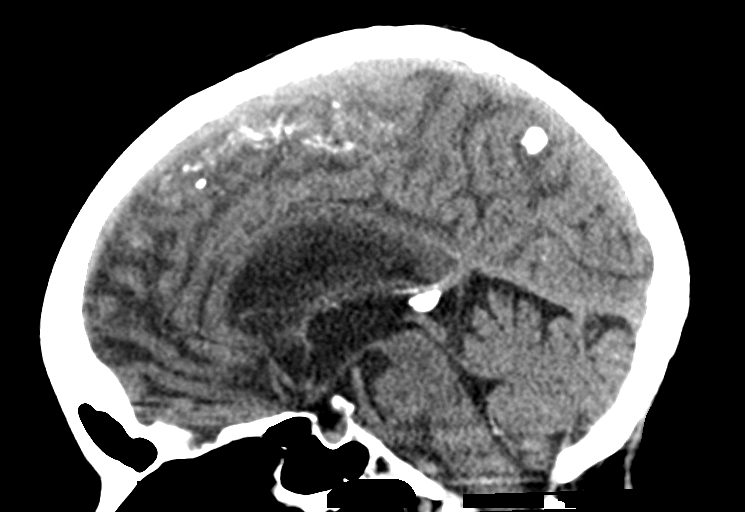
[im 33/49  brain]
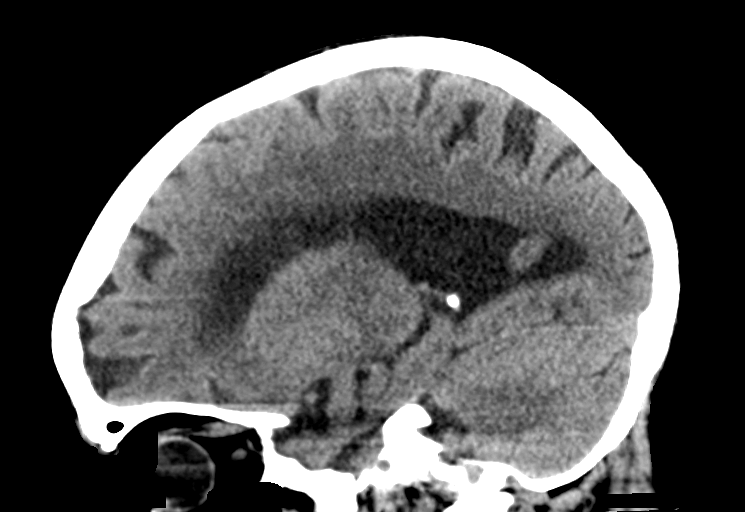

[15 of 47 positions shown; findings below may reference images not displayed]

FINDINGS: Brain: There is no acute intracranial hemorrhage, mass effect, or
edema. Gray-white differentiation is preserved. There is no
extra-axial fluid collection. Prominence of the ventricles and sulci
similar to the prior MRI. There is somewhat disproportionate
ventricular prominence, which may reflect central volume loss or
communicating hydrocephalus in the appropriate setting. Patchy
hypoattenuation in the supratentorial white matter is nonspecific
but may reflect mild microvascular ischemic changes.

Vascular: There is atherosclerotic calcification at the skull base.

Skull: Calvarium is unremarkable.

Sinuses/Orbits: No acute finding.

Other: None.
IMPRESSION: No evidence of acute intracranial injury.

## 2019-09-08 MED ORDER — ENOXAPARIN SODIUM 40 MG/0.4ML ~~LOC~~ SOLN
40.0000 mg | SUBCUTANEOUS | Status: DC
  Administered 2019-09-08: 40 mg via SUBCUTANEOUS
  Filled 2019-09-08: qty 0.4

## 2019-09-08 MED ORDER — VITAMIN B-12 1000 MCG PO TABS
500.0000 ug | ORAL_TABLET | Freq: Every day | ORAL | Status: DC
  Administered 2019-09-10 – 2019-09-13 (×4): 500 ug via ORAL
  Filled 2019-09-08 (×5): qty 1

## 2019-09-08 MED ORDER — CHLORHEXIDINE GLUCONATE CLOTH 2 % EX PADS: 6.0000 | MEDICATED_PAD | Freq: Every day | CUTANEOUS | Status: DC

## 2019-09-08 MED ORDER — HYDROCODONE-ACETAMINOPHEN 5-325 MG PO TABS
1.0000 | ORAL_TABLET | ORAL | Status: DC | PRN
  Administered 2019-09-08 – 2019-09-09 (×2): 1 via ORAL
  Filled 2019-09-08 (×2): qty 1

## 2019-09-08 MED ORDER — DONEPEZIL HCL 5 MG PO TABS
10.0000 mg | ORAL_TABLET | Freq: Every day | ORAL | Status: DC
  Administered 2019-09-08 – 2019-09-12 (×5): 10 mg via ORAL
  Filled 2019-09-08 (×5): qty 2

## 2019-09-08 MED ORDER — SERTRALINE HCL 50 MG PO TABS
25.0000 mg | ORAL_TABLET | Freq: Every day | ORAL | Status: DC
  Administered 2019-09-08 – 2019-09-13 (×5): 25 mg via ORAL
  Filled 2019-09-08 (×5): qty 1

## 2019-09-08 MED ORDER — ATORVASTATIN CALCIUM 10 MG PO TABS
10.0000 mg | ORAL_TABLET | Freq: Every day | ORAL | Status: DC
  Administered 2019-09-10 – 2019-09-13 (×4): 10 mg via ORAL
  Filled 2019-09-08 (×4): qty 1

## 2019-09-08 MED ORDER — RALOXIFENE HCL 60 MG PO TABS
60.0000 mg | ORAL_TABLET | Freq: Every day | ORAL | Status: DC
  Administered 2019-09-10 – 2019-09-13 (×4): 60 mg via ORAL
  Filled 2019-09-08 (×5): qty 1

## 2019-09-08 MED ORDER — LACTATED RINGERS IV SOLN: INTRAVENOUS | Status: DC

## 2019-09-08 MED ORDER — MUPIROCIN 2 % EX OINT
1.0000 "application " | TOPICAL_OINTMENT | Freq: Two times a day (BID) | CUTANEOUS | Status: AC
  Administered 2019-09-08 – 2019-09-13 (×10): 1 via NASAL
  Filled 2019-09-08: qty 22

## 2019-09-08 NOTE — ED Provider Notes (Addendum)
[facility] Emergency Department Provider Note  ____________________________________________   First MD Initiated Contact with Patient 09/08/19 1527     (approximate)  I have reviewed the triage vital signs and the nursing notes.   HISTORY  Chief Complaint Fall    HPI Dana Love is a 86 y.o. female presents emergency department via EMS for a likely fall.  Patient states she was exercising in the hallway tripped over her tennis shoe and fell to the floor.  She is complaining of right-sided hip pain.  She denies head injury.  Patient does have history of Alzheimer's.  She denies chest pain/shortness of breath.   Pain is rated at 5/10   Past Medical History:  Diagnosis Date  . Alzheimer disease [facility])     Patient Active Problem List   Diagnosis Date Noted  . Closed right hip fracture (HCC) 09/08/2019    History reviewed. No pertinent surgical history.  Prior to Admission medications   Medication Sig Start Date End Date Taking? Authorizing Provider  atorvastatin (LIPITOR) 10 MG tablet Take 10 mg by mouth daily.    [provider]  donepezil (ARICEPT) 10 MG tablet Take 10 mg by mouth at bedtime.    [provider]  HYDROcodone-acetaminophen (NORCO/VICODIN) 5-325 MG tablet Take 1 tablet by mouth every 4 (four) hours as needed for moderate pain. 12/31/18 12/31/19  Chesley Noon, MD  lidocaine (LIDODERM) 5 % Place 1 patch onto the skin every 12 (twelve) hours. Remove & Discard patch within 12 hours or as directed by MD 12/31/18 12/31/19  Chesley Noon, MD  predniSONE (DELTASONE) 5 MG tablet Take 1 tablet (5 mg total) by mouth daily with breakfast. 12/29/18   Tommi Rumps, PA-C  raloxifene (EVISTA) 60 MG tablet Take 60 mg by mouth daily.    [provider]  sertraline (ZOLOFT) 25 MG tablet Take 25 mg by mouth daily.    [provider]  vitamin B-12 (CYANOCOBALAMIN) 500 MCG tablet Take 500 mcg by mouth daily.     [provider]    Allergies Patient has no known allergies.  No family history on file.  Social History Social History   Tobacco Use  . Smoking status: Never Smoker  . Smokeless tobacco: Never Used  Substance Use Topics  . Alcohol use: Never  . Drug use: Never    Review of Systems  Constitutional: No fever/chills Eyes: No visual changes. ENT: No sore throat. Respiratory: Denies cough Cardiovascular: Denies chest pain Gastrointestinal: Denies abdominal pain Genitourinary: Negative for dysuria. Musculoskeletal: Negative for back pain.  Positive for right hip pain Skin: Negative for rash. Psychiatric: no mood changes,     ____________________________________________   PHYSICAL EXAM:  VITAL SIGNS: ED Triage Vitals  Enc Vitals Group     BP      Pulse      Resp      Temp      Temp src      SpO2      Weight      Height      Head Circumference      Peak Flow      Pain Score      Pain Loc      Pain Edu?      Excl. in GC?     Constitutional: Alert and oriented. Well appearing and in no acute distress. Eyes: Conjunctivae are normal.  Head: Atraumatic. Nose: No congestion/rhinnorhea. Mouth/Throat: Mucous membranes are moist.   Neck:  supple no lymphadenopathy noted Cardiovascular: Normal rate, regular rhythm. Heart sounds are normal Respiratory: Normal respiratory effort.  No retractions, lungs c t a  Abd: soft nontender bs normal all 4 quad GU: deferred Musculoskeletal: Right hip is tender to the patient, knee is nontender, lumbar spine is nontender, neurovascular is intact range of motion was not performed on the right hip due to concerns of fracture  neurologic:  Normal speech and language.  Skin:  Skin is warm, dry and intact. No rash noted. Psychiatric: Mood and affect are normal. Speech and behavior are normal.  ____________________________________________   LABS (all labs ordered are listed, but only abnormal results are  displayed)  Labs Reviewed  SARS CORONAVIRUS 2 BY RT PCR (HOSPITAL ORDER, PERFORMED IN [facility])  CBC WITH DIFFERENTIAL/PLATELET  COMPREHENSIVE METABOLIC PANEL  TYPE AND SCREEN   ____________________________________________   ____________________________________________  RADIOLOGY  X-ray of the right hip, chest x-ray, CT of the head  ____________________________________________   PROCEDURES  Procedure(s) performed: No  Procedures    ____________________________________________   INITIAL IMPRESSION / ASSESSMENT AND PLAN / ED COURSE  Pertinent labs & imaging results that were available during my care of the patient were reviewed by me and considered in my medical decision making (see chart for details).   Patient seen 41-year-old female presents emergency department from [place], after a fall.  Concerns for right hip fracture.  Physical exam is consistent with a right hip fracture.  DDx: Fall, hip fracture, ramus fracture, contusion  X-ray of the right hip, radiologist, CT of the head due to patient's age and fall.  ----------------------------------------- 4:54 PM on 09/08/2019 -----------------------------------------  CT the head is negative, chest x-ray is normal, x-ray of the right hip does show a femoral neck fracture.  I did discuss this with Dr. Martha Clan.  He would like for [place] to have hospitalist admit.  Hospitalist paged and notified of right hip fracture.  He is in see the patient.  Will be assuming care at this time.  Pertinent labs were ordered for preop.     Dana Love was evaluated in Emergency Department on 09/08/2019 for the symptoms described in the history of present illness. She was evaluated in the context of the global COVID-19 pandemic, which necessitated consideration that the patient might be at risk for infection with the SARS-CoV-2 virus that causes COVID-19. Institutional protocols and algorithms that pertain to the  evaluation of patients at risk for COVID-19 are in a state of rapid change based on information released by regulatory bodies including the CDC and federal and state organizations. These policies and algorithms were followed during the patient's care in the ED.    As part of my medical decision making, I reviewed the following data within the electronic MEDICAL RECORD NUMBER History obtained from family, Nursing notes reviewed and incorporated, Labs reviewed , Old chart reviewed, Radiograph reviewed , Discussed with admitting physician , A consult was requested and obtained from this/these consultant(s) Orthopedics, Evaluated by EM attending dr Cyril Loosen, Notes from prior ED visits and [place] Controlled Substance Database  ____________________________________________   FINAL CLINICAL IMPRESSION(S) / ED DIAGNOSES  Final diagnoses:  Closed right hip fracture, initial encounter (HCC)  Fall, initial encounter      NEW MEDICATIONS STARTED DURING THIS VISIT:  New Prescriptions   No medications on file     Note:  This document was prepared using Dragon voice recognition software and may include unintentional dictation errors.    Faythe Ghee,  PA-C 09/08/19 1655    Faythe Ghee, PA-C 09/08/19 1857    Jene Every, MD 09/08/19 Kristopher Oppenheim

## 2019-09-08 NOTE — H&P (Signed)
Dana Love is an 86 y.o. female.   Chief Complaint: Fall. HPI:  Patient is 86 year old female with history of Alzheimer dementia, dyslipidemia who present to the hospital after a fall.  X-ray showed a right hip fracture, she will be seen by orthopedics for surgery. Patient currently living at assisted living facility, she has no difficulty with walking.  Not short of breath or chest pain with exertion.  Patient was walking in the hallway, she turned too fast, and fell on the ground.  Sustained a fracture on the right hip.  He did not have any dizziness prior to the fall, she did not lose consciousness. Upon arriving the emergency room, CT head did not show any acute changes.  Hip x-ray showed a right hip fracture.  Lab test and Covid test pending.  Past Medical History:  Diagnosis Date  . Alzheimer disease (HCC)     History reviewed. No pertinent surgical history.  No family history on file.  Social History:  reports that she has never smoked. She has never used smokeless tobacco. She reports that she does not drink alcohol and does not use drugs.  Allergies: No Known Allergies  (Not in a hospital admission)   No results found for this or any previous visit (from the past 48 hour(s)). CT Head Wo Contrast  Result Date: 09/08/2019 CLINICAL DATA:  Fall EXAM: CT HEAD WITHOUT CONTRAST TECHNIQUE: Contiguous axial images were obtained from the base of the skull through the vertex without intravenous contrast. COMPARISON:  Correlation made with MRI from 2020 FINDINGS: Brain: There is no acute intracranial hemorrhage, mass effect, or edema. Gray-white differentiation is preserved. There is no extra-axial fluid collection. Prominence of the ventricles and sulci similar to the prior MRI. There is somewhat disproportionate ventricular prominence, which may reflect central volume loss or communicating hydrocephalus in the appropriate setting. Patchy hypoattenuation in the supratentorial white matter  is nonspecific but may reflect mild microvascular ischemic changes. Vascular: There is atherosclerotic calcification at the skull base. Skull: Calvarium is unremarkable. Sinuses/Orbits: No acute finding. Other: None. IMPRESSION: No evidence of acute intracranial injury. Electronically Signed   By: Guadlupe Spanish M.D.   On: 09/08/2019 16:46   DG Chest Portable 1 View  Result Date: 09/08/2019 CLINICAL DATA:  The patient suffered a trip and fall over a shoe string today. EXAM: PORTABLE CHEST 1 VIEW COMPARISON:  None. FINDINGS: Single, small calcified granulomas are seen in both the right and left chest. Lungs otherwise clear. Heart size normal. No pneumothorax or pleural effusion. No acute or focal bony abnormality. IMPRESSION: No acute disease. Electronically Signed   By: Drusilla Kanner M.D.   On: 09/08/2019 16:24   DG Hip Unilat W or Wo Pelvis 2-3 Views Right  Result Date: 09/08/2019 CLINICAL DATA:  86 year old female with fall and right hip pain. EXAM: DG HIP (WITH OR WITHOUT PELVIS) 2-3V RIGHT COMPARISON:  None. FINDINGS: Mildly impacted fracture of the neck of the right femur. There is no dislocation. The bones are osteopenic. There is mild bilateral hip arthritic changes. Degenerative changes of the lower lumbar spine. The soft tissues are unremarkable. IMPRESSION: Mildly impacted fracture of the right femoral neck. Electronically Signed   By: Elgie Collard M.D.   On: 09/08/2019 16:20    Review of Systems  Constitutional: Negative for activity change, appetite change, fatigue, fever and unexpected weight change.  HENT: Negative for rhinorrhea, sinus pain and sore throat.   Eyes: Negative for discharge and itching.  Respiratory: Negative for  cough, shortness of breath and wheezing.   Cardiovascular: Negative for chest pain, palpitations and leg swelling.  Gastrointestinal: Positive for constipation. Negative for abdominal pain, diarrhea and nausea.  Endocrine: Negative for cold intolerance  and heat intolerance.  Genitourinary: Negative for dysuria, flank pain and frequency.  Musculoskeletal: Negative for back pain, gait problem and myalgias.  Neurological: Negative for dizziness, syncope, light-headedness and headaches.  Psychiatric/Behavioral: Negative for agitation, confusion and hallucinations.    Blood pressure 140/72, pulse 69, temperature 98.4 F (36.9 C), temperature source Oral, resp. rate 18, height 5\' 1"  (1.549 m), weight 49.9 kg, SpO2 97 %. Physical Exam Constitutional:      General: She is not in acute distress.    Appearance: Normal appearance. She is not ill-appearing or toxic-appearing.  HENT:     Head: Normocephalic and atraumatic.     Nose: Nose normal. No congestion.  Eyes:     General: No scleral icterus.    Extraocular Movements: Extraocular movements intact.     Conjunctiva/sclera: Conjunctivae normal.     Pupils: Pupils are equal, round, and reactive to light.  Neck:     Vascular: No carotid bruit.  Cardiovascular:     Rate and Rhythm: Normal rate and regular rhythm.     Heart sounds: No murmur heard.  No friction rub. No gallop.   Pulmonary:     Effort: Pulmonary effort is normal. No respiratory distress.     Breath sounds: Normal breath sounds. No wheezing.  Abdominal:     General: Abdomen is flat. Bowel sounds are normal. There is no distension.     Palpations: Abdomen is soft.     Tenderness: There is no abdominal tenderness. There is no guarding.  Musculoskeletal:        General: Normal range of motion.     Cervical back: Normal range of motion and neck supple. No rigidity or tenderness.     Right lower leg: No edema.     Left lower leg: No edema.  Lymphadenopathy:     Cervical: No cervical adenopathy.  Skin:    General: Skin is warm and dry.     Coloration: Skin is not jaundiced.  Neurological:     General: No focal deficit present.     Mental Status: She is alert and oriented to person, place, and time.     Cranial Nerves: No  cranial nerve deficit.     Sensory: No sensory deficit.  Psychiatric:        Mood and Affect: Mood normal.        Behavior: Behavior normal.      Assessment/Plan #1.  Right hip fracture. Patient will be seen by orthopedic surgery.  From her history and exam, she does not have  risk for surgery except old age.  No cardiac conditions.  No additional work-up is needed before surgery.  #2.  Alzheimer dementia. Continue home medicine.  3.  Dyslipidemia.  Continue home medicines.  4.  Prophylaxis. Start prophylactic Lovenox.    , MD 09/08/2019, 5:15 PM

## 2019-09-08 NOTE — ED Triage Notes (Signed)
Patient arrived from assisted living home and arrived via EMS due to a fall. Patient walks for exercise and tripped over her tennis shoestring. No LOC, patient did not hit her head, she does have pain in right upper leg. VSS patient alert and oriented

## 2019-09-09 ENCOUNTER — Encounter
Admission: EM | Disposition: A | Discharge status: Skilled Nursing Facility | Payer: Self-pay | Source: Home / Self Care | Attending: Internal Medicine

## 2019-09-09 ENCOUNTER — Encounter: Payer: Self-pay | Admitting: Internal Medicine

## 2019-09-09 ENCOUNTER — Inpatient Hospital Stay: Payer: Medicare PPO

## 2019-09-09 ENCOUNTER — Inpatient Hospital Stay: Payer: Medicare PPO | Admitting: Anesthesiology

## 2019-09-09 ENCOUNTER — Other Ambulatory Visit: Payer: Self-pay

## 2019-09-09 DIAGNOSIS — S72001D Fracture of unspecified part of neck of right femur, subsequent encounter for closed fracture with routine healing: Secondary | ICD-10-CM

## 2019-09-09 HISTORY — PX: HIP PINNING,CANNULATED: SHX1758

## 2019-09-09 LAB — CBC
HCT: 37.5 % (ref 36.0–46.0)
Hemoglobin: 12.3 g/dL (ref 12.0–15.0)
MCH: 30.6 pg (ref 26.0–34.0)
MCHC: 32.8 g/dL (ref 30.0–36.0)
MCV: 93.3 fL (ref 80.0–100.0)
Platelets: 129 10*3/uL — ABNORMAL LOW (ref 150–400)
RBC: 4.02 MIL/uL (ref 3.87–5.11)
RDW: 13.7 % (ref 11.5–15.5)
WBC: 9.2 10*3/uL (ref 4.0–10.5)
nRBC: 0 % (ref 0.0–0.2)

## 2019-09-09 LAB — BASIC METABOLIC PANEL
Anion gap: 10 (ref 5–15)
BUN: 16 mg/dL (ref 8–23)
CO2: 24 mmol/L (ref 22–32)
Calcium: 8.5 mg/dL — ABNORMAL LOW (ref 8.9–10.3)
Chloride: 102 mmol/L (ref 98–111)
Creatinine, Ser: 0.77 mg/dL (ref 0.44–1.00)
GFR calc Af Amer: >60 mL/min (ref >60)
GFR calc non Af Amer: >60 mL/min (ref >60)
Glucose, Bld: 96 mg/dL (ref 70–99)
Potassium: 3.6 mmol/L (ref 3.5–5.1)
Sodium: 136 mmol/L (ref 135–145)

## 2019-09-09 LAB — APTT: aPTT: 29 seconds (ref 24–36)

## 2019-09-09 LAB — PROTIME-INR
INR: 1 (ref 0.8–1.2)
Prothrombin Time: 12.5 seconds (ref 11.4–15.2)

## 2019-09-09 IMAGING — DX DG HIP (WITH OR WITHOUT PELVIS) 1V PORT*R*
2 series · 2 of 2 positions shown · non-contrast
Comparison: Radiographs [DATE]

CLINICAL DATA: Fixation of right hip fracture.

EXAM:
DG HIP (WITH OR WITHOUT PELVIS) 1V PORT RIGHT; OPERATIVE RIGHT HIP
WITH PELVIS

[pelvis ap]
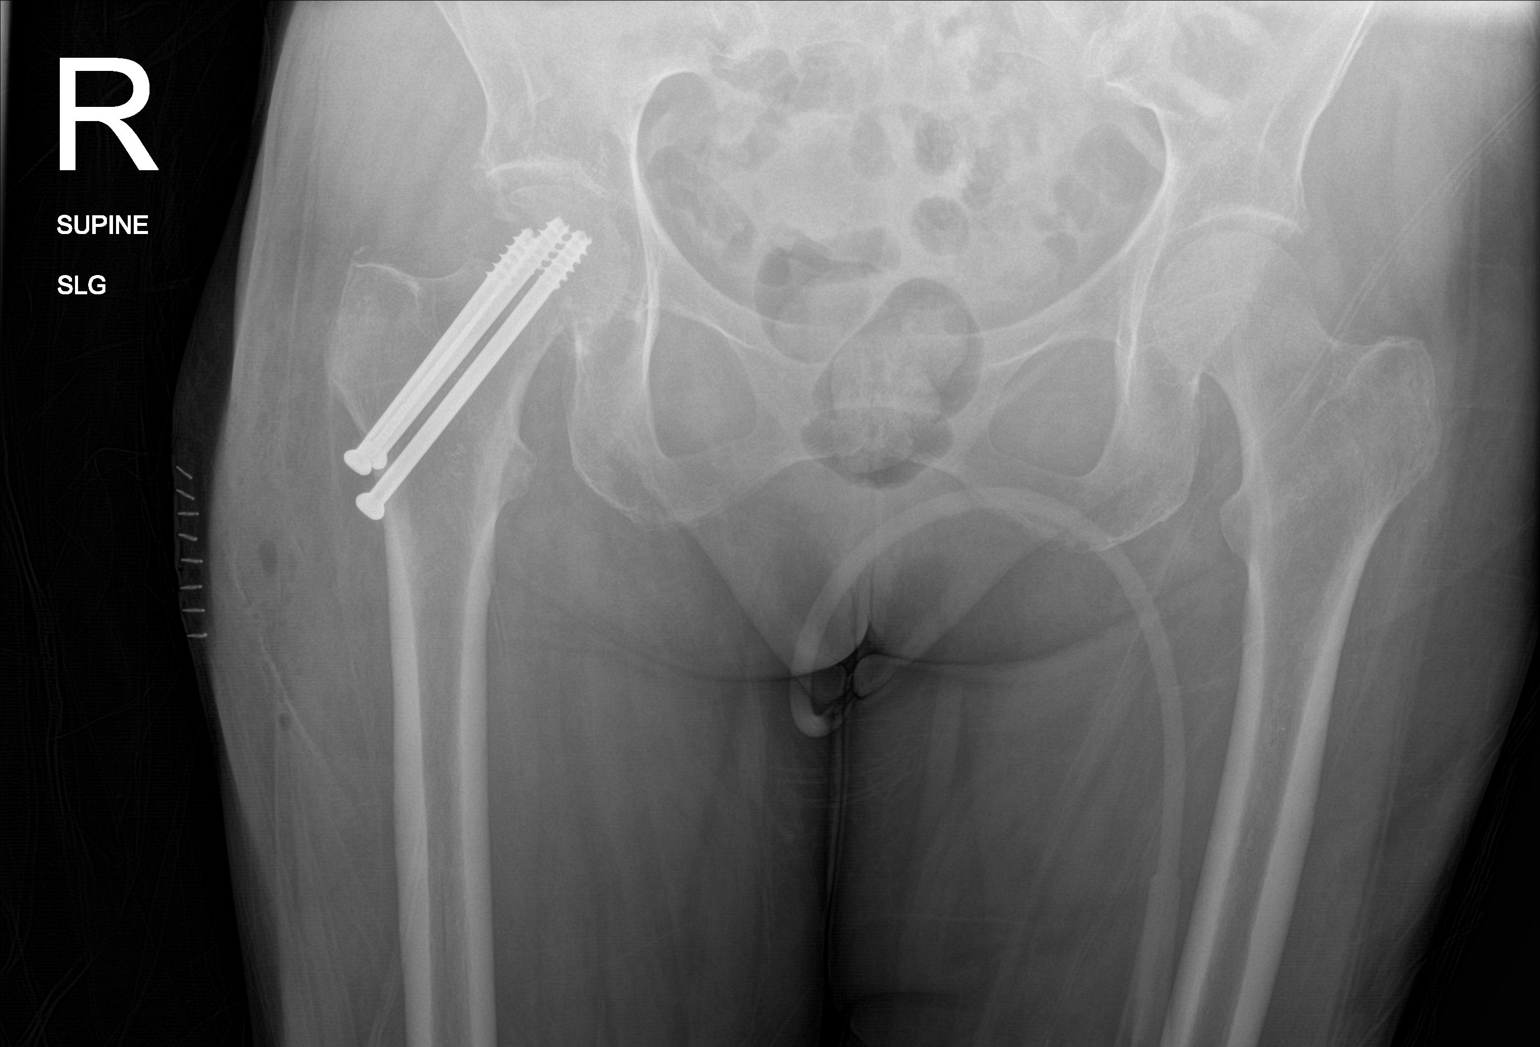

[hip lat]
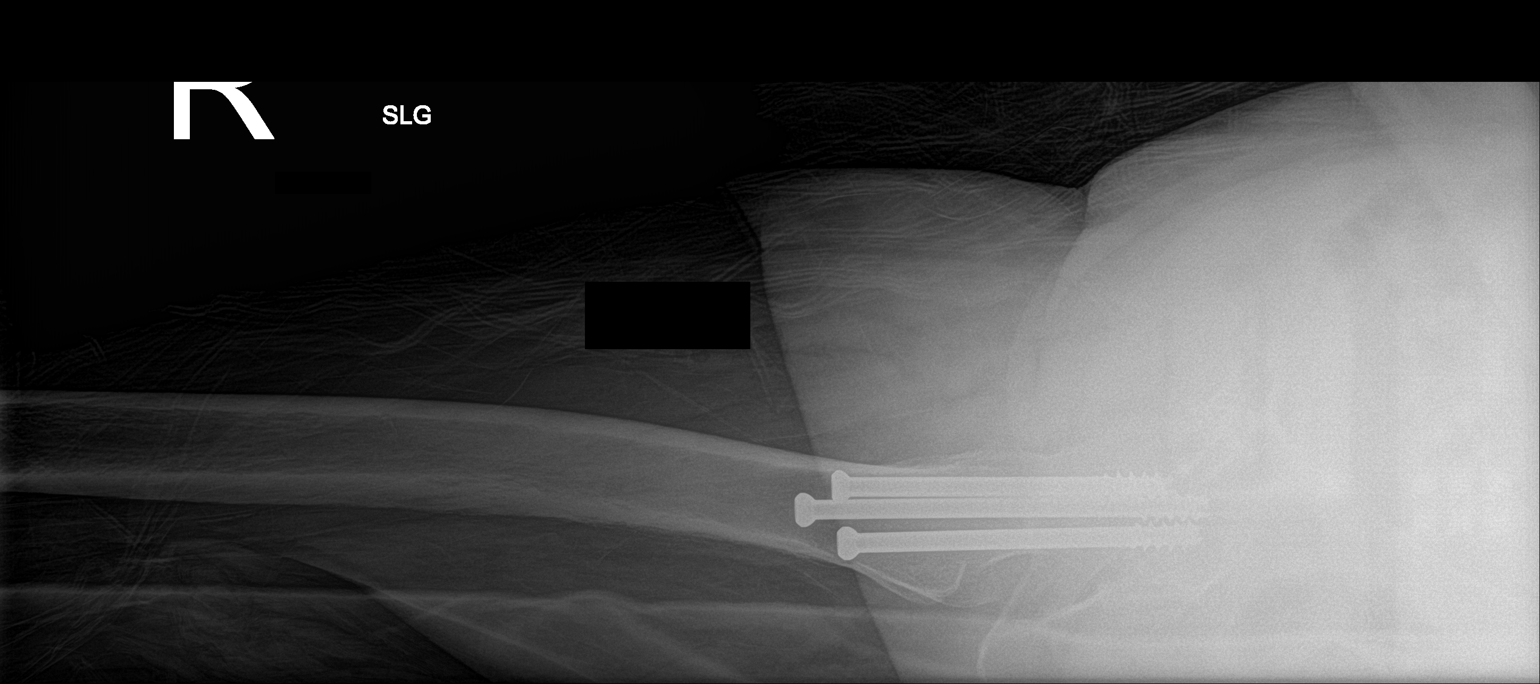

[2 of 2 positions shown; findings below may reference images not displayed]

FINDINGS: There are 3 cannulated hip screws fixating the patient's high
femoral neck fracture. Good position and alignment without
complicating features.
IMPRESSION: Internal fixation of high femoral neck fracture.

## 2019-09-09 IMAGING — XA DG HIP (WITH PELVIS) OPERATIVE*R*
2 series · 2 of 2 positions shown · non-contrast
Comparison: Radiographs [DATE]

CLINICAL DATA: Fixation of right hip fracture.

EXAM:
DG HIP (WITH OR WITHOUT PELVIS) 1V PORT RIGHT; OPERATIVE RIGHT HIP
WITH PELVIS

[Series 13: cont. · 1 of 1 slices shown (1 of 2)]
[im 1/1]
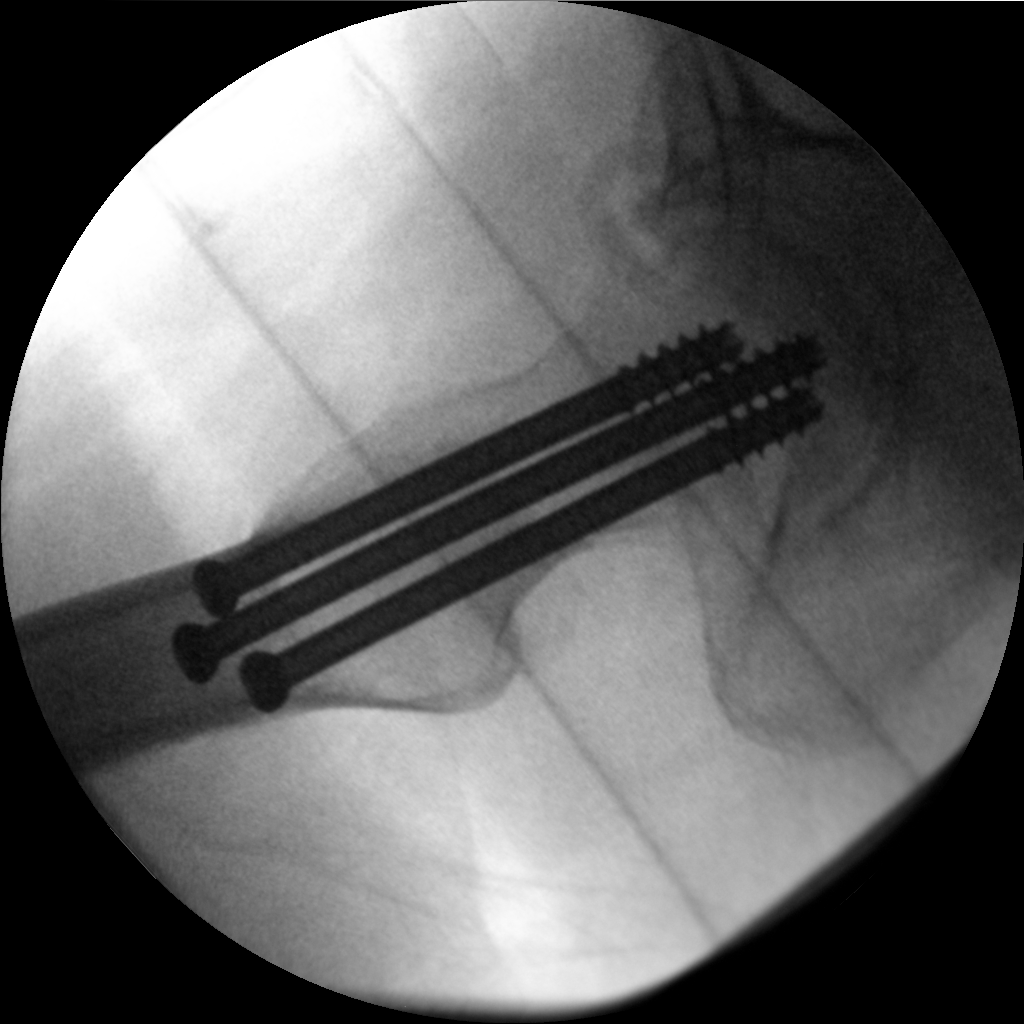

[Series 14: cont. · 1 of 1 slices shown (2 of 2)]
[im 1/1]
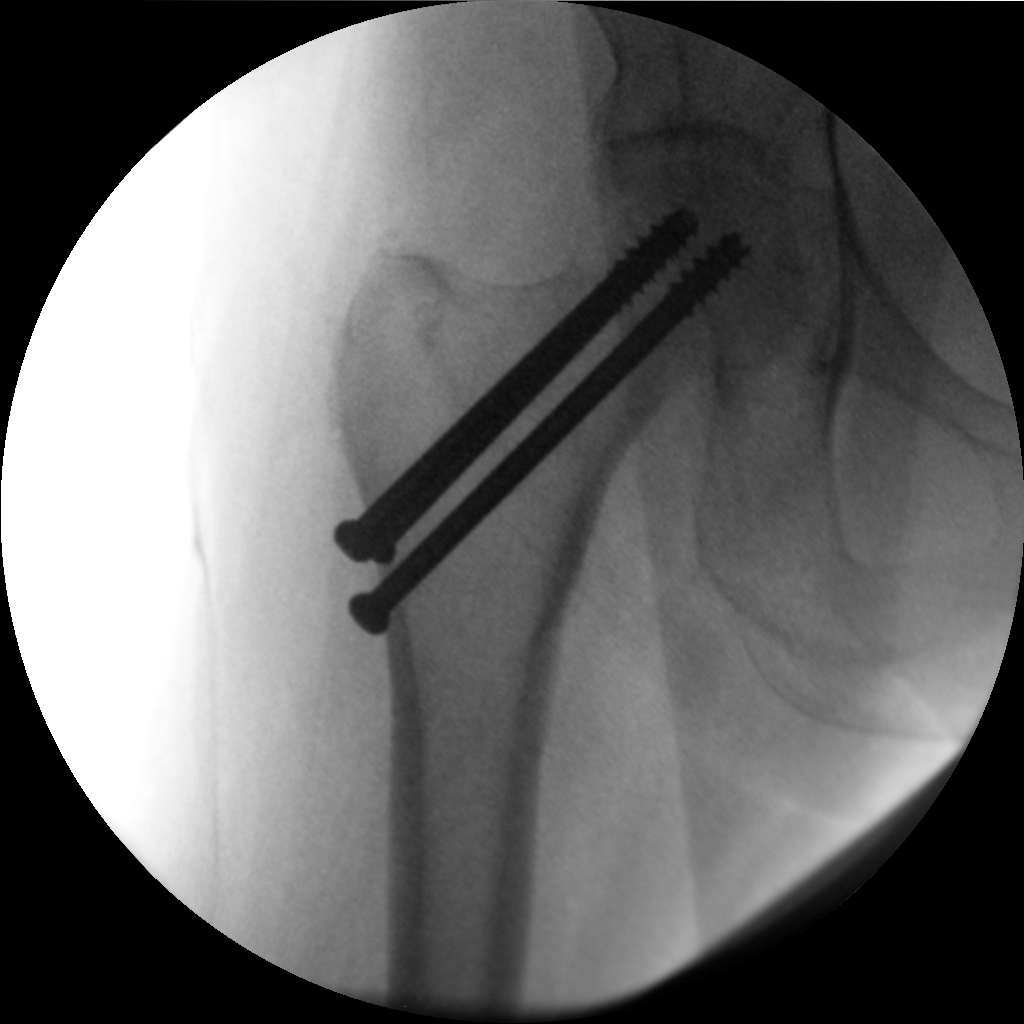

[2 of 2 positions shown; findings below may reference images not displayed]

FINDINGS: There are 3 cannulated hip screws fixating the patient's high
femoral neck fracture. Good position and alignment without
complicating features.
IMPRESSION: Internal fixation of high femoral neck fracture.

## 2019-09-09 SURGERY — FIXATION, FEMUR, NECK, PERCUTANEOUS, USING SCREW
Anesthesia: Spinal | Site: Hip | Laterality: Right

## 2019-09-09 MED ORDER — METHOCARBAMOL 1000 MG/10ML IJ SOLN
500.0000 mg | Freq: Four times a day (QID) | INTRAVENOUS | Status: DC | PRN
  Filled 2019-09-09: qty 5

## 2019-09-09 MED ORDER — SENNA 8.6 MG PO TABS
1.0000 | ORAL_TABLET | Freq: Two times a day (BID) | ORAL | Status: DC
  Administered 2019-09-09 – 2019-09-13 (×8): 8.6 mg via ORAL
  Filled 2019-09-09 (×8): qty 1

## 2019-09-09 MED ORDER — FENTANYL CITRATE (PF) 100 MCG/2ML IJ SOLN: 25.0000 ug | INTRAMUSCULAR | Status: DC | PRN

## 2019-09-09 MED ORDER — HYDROCODONE-ACETAMINOPHEN 5-325 MG PO TABS
1.0000 | ORAL_TABLET | ORAL | Status: DC | PRN
  Administered 2019-09-10: 1 via ORAL
  Filled 2019-09-09: qty 1

## 2019-09-09 MED ORDER — PROPOFOL 500 MG/50ML IV EMUL
INTRAVENOUS | Status: DC | PRN
  Administered 2019-09-09: 50 ug/kg/min via INTRAVENOUS
  Administered 2019-09-09: 100 ug/kg/min via INTRAVENOUS

## 2019-09-09 MED ORDER — CEFAZOLIN SODIUM-DEXTROSE 2-4 GM/100ML-% IV SOLN
2.0000 g | INTRAVENOUS | Status: AC
  Administered 2019-09-09: 2 g via INTRAVENOUS
  Filled 2019-09-09: qty 100

## 2019-09-09 MED ORDER — METHOCARBAMOL 500 MG PO TABS
500.0000 mg | ORAL_TABLET | Freq: Four times a day (QID) | ORAL | Status: DC | PRN
  Administered 2019-09-09 – 2019-09-11 (×3): 500 mg via ORAL
  Filled 2019-09-09 (×3): qty 1

## 2019-09-09 MED ORDER — ACETAMINOPHEN 500 MG PO TABS
500.0000 mg | ORAL_TABLET | Freq: Four times a day (QID) | ORAL | Status: AC
  Administered 2019-09-09 – 2019-09-10 (×3): 500 mg via ORAL
  Filled 2019-09-09 (×3): qty 1

## 2019-09-09 MED ORDER — ONDANSETRON HCL 4 MG/2ML IJ SOLN: 4.0000 mg | Freq: Once | INTRAMUSCULAR | Status: DC | PRN

## 2019-09-09 MED ORDER — CHLORHEXIDINE GLUCONATE CLOTH 2 % EX PADS
6.0000 | MEDICATED_PAD | Freq: Every day | CUTANEOUS | Status: DC
  Administered 2019-09-09 – 2019-09-13 (×5): 6 via TOPICAL

## 2019-09-09 MED ORDER — BISACODYL 10 MG RE SUPP: 10.0000 mg | Freq: Every day | RECTAL | Status: DC | PRN

## 2019-09-09 MED ORDER — SODIUM CHLORIDE 0.9 % IV SOLN
INTRAVENOUS | Status: DC | PRN
  Administered 2019-09-09: 25 ug/min via INTRAVENOUS

## 2019-09-09 MED ORDER — FENTANYL CITRATE (PF) 100 MCG/2ML IJ SOLN
INTRAMUSCULAR | Status: DC | PRN
Start: 2019-09-09 — End: 2019-09-09
  Administered 2019-09-09: 50 ug via INTRAVENOUS

## 2019-09-09 MED ORDER — PROPOFOL 10 MG/ML IV BOLUS
INTRAVENOUS | Status: AC
  Filled 2019-09-09: qty 20

## 2019-09-09 MED ORDER — ASCORBIC ACID 500 MG PO TABS
500.0000 mg | ORAL_TABLET | Freq: Two times a day (BID) | ORAL | Status: DC
  Administered 2019-09-09 – 2019-09-13 (×8): 500 mg via ORAL
  Filled 2019-09-09 (×8): qty 1

## 2019-09-09 MED ORDER — DOCUSATE SODIUM 100 MG PO CAPS
100.0000 mg | ORAL_CAPSULE | Freq: Two times a day (BID) | ORAL | Status: DC
  Administered 2019-09-09 – 2019-09-13 (×8): 100 mg via ORAL
  Filled 2019-09-09 (×8): qty 1

## 2019-09-09 MED ORDER — NEOMYCIN-POLYMYXIN B GU 40-200000 IR SOLN
Status: DC | PRN
  Administered 2019-09-09: 4 mL

## 2019-09-09 MED ORDER — ONDANSETRON HCL 4 MG/2ML IJ SOLN
INTRAMUSCULAR | Status: DC | PRN
  Administered 2019-09-09: 4 mg via INTRAVENOUS

## 2019-09-09 MED ORDER — MAGNESIUM CITRATE PO SOLN
1.0000 | Freq: Once | ORAL | Status: DC | PRN
  Filled 2019-09-09: qty 296

## 2019-09-09 MED ORDER — ONDANSETRON HCL 4 MG PO TABS: 4.0000 mg | ORAL_TABLET | Freq: Four times a day (QID) | ORAL | Status: DC | PRN

## 2019-09-09 MED ORDER — MIDAZOLAM HCL 2 MG/2ML IJ SOLN
INTRAMUSCULAR | Status: AC
  Filled 2019-09-09: qty 2

## 2019-09-09 MED ORDER — VITAMIN D3 25 MCG (1000 UNIT) PO TABS
2000.0000 [IU] | ORAL_TABLET | Freq: Every day | ORAL | Status: DC
  Administered 2019-09-10 – 2019-09-13 (×4): 2000 [IU] via ORAL
  Filled 2019-09-09 (×5): qty 2

## 2019-09-09 MED ORDER — ENOXAPARIN SODIUM 40 MG/0.4ML ~~LOC~~ SOLN
40.0000 mg | SUBCUTANEOUS | Status: DC
  Administered 2019-09-10 – 2019-09-13 (×4): 40 mg via SUBCUTANEOUS
  Filled 2019-09-09 (×4): qty 0.4

## 2019-09-09 MED ORDER — ONDANSETRON HCL 4 MG/2ML IJ SOLN: 4.0000 mg | Freq: Four times a day (QID) | INTRAMUSCULAR | Status: DC | PRN

## 2019-09-09 MED ORDER — CEFAZOLIN SODIUM-DEXTROSE 1-4 GM/50ML-% IV SOLN
1.0000 g | Freq: Four times a day (QID) | INTRAVENOUS | Status: AC
  Administered 2019-09-09 – 2019-09-10 (×2): 1 g via INTRAVENOUS
  Filled 2019-09-09 (×2): qty 50

## 2019-09-09 MED ORDER — HYDROCODONE-ACETAMINOPHEN 7.5-325 MG PO TABS
1.0000 | ORAL_TABLET | ORAL | Status: DC | PRN
  Administered 2019-09-10 – 2019-09-11 (×3): 1 via ORAL
  Filled 2019-09-09 (×3): qty 1

## 2019-09-09 MED ORDER — KETOROLAC TROMETHAMINE 15 MG/ML IJ SOLN
7.5000 mg | Freq: Four times a day (QID) | INTRAMUSCULAR | Status: AC
  Administered 2019-09-09 – 2019-09-10 (×3): 7.5 mg via INTRAVENOUS
  Filled 2019-09-09 (×3): qty 1

## 2019-09-09 MED ORDER — MORPHINE SULFATE (PF) 2 MG/ML IV SOLN
0.5000 mg | INTRAVENOUS | Status: DC | PRN
  Administered 2019-09-09 – 2019-09-11 (×2): 0.5 mg via INTRAVENOUS
  Filled 2019-09-09 (×2): qty 1

## 2019-09-09 MED ORDER — ACETAMINOPHEN 325 MG PO TABS
325.0000 mg | ORAL_TABLET | Freq: Four times a day (QID) | ORAL | Status: DC | PRN
  Administered 2019-09-12 – 2019-09-13 (×2): 325 mg via ORAL
  Filled 2019-09-09 (×2): qty 1

## 2019-09-09 MED ORDER — ALUM & MAG HYDROXIDE-SIMETH 200-200-20 MG/5ML PO SUSP: 30.0000 mL | ORAL | Status: DC | PRN

## 2019-09-09 MED ORDER — SODIUM CHLORIDE 0.9 % IV SOLN
75.0000 mL/h | INTRAVENOUS | Status: DC
  Administered 2019-09-09 (×2): 75 mL/h via INTRAVENOUS

## 2019-09-09 MED ORDER — POLYETHYLENE GLYCOL 3350 17 G PO PACK
17.0000 g | PACK | Freq: Every day | ORAL | Status: DC | PRN
  Administered 2019-09-12: 17 g via ORAL
  Filled 2019-09-09: qty 1

## 2019-09-09 MED ORDER — FENTANYL CITRATE (PF) 100 MCG/2ML IJ SOLN
INTRAMUSCULAR | Status: AC
  Filled 2019-09-09: qty 2

## 2019-09-09 SURGICAL SUPPLY — 35 items
BIT DRILL CANN LRG QC 5X300 (BIT) ×3 IMPLANT
BLADE SURG SZ10 CARB STEEL (BLADE) ×3 IMPLANT
BNDG COHESIVE 6X5 TAN STRL LF (GAUZE/BANDAGES/DRESSINGS) ×6 IMPLANT
CANISTER SUCT 1200ML W/VALVE (MISCELLANEOUS) ×3 IMPLANT
COVER WAND RF STERILE (DRAPES) ×3 IMPLANT
DRAPE SURG 17X11 SM STRL (DRAPES) ×6 IMPLANT
DRAPE U-SHAPE 47X51 STRL (DRAPES) ×3 IMPLANT
DRSG OPSITE POSTOP 3X4 (GAUZE/BANDAGES/DRESSINGS) ×3 IMPLANT
DRSG OPSITE POSTOP 4X6 (GAUZE/BANDAGES/DRESSINGS) IMPLANT
DURAPREP 26ML APPLICATOR (WOUND CARE) ×3 IMPLANT
ELECT REM PT RETURN 9FT ADLT (ELECTROSURGICAL) ×3
ELECTRODE REM PT RTRN 9FT ADLT (ELECTROSURGICAL) ×1 IMPLANT
GAUZE SPONGE 4X4 12PLY STRL (GAUZE/BANDAGES/DRESSINGS) ×3 IMPLANT
GLOVE BIOGEL PI IND STRL 9 (GLOVE) ×1 IMPLANT
GLOVE BIOGEL PI INDICATOR 9 (GLOVE) ×2
GLOVE SURG 9.0 ORTHO LTXF (GLOVE) ×6 IMPLANT
GOWN STRL REUS TWL 2XL XL LVL4 (GOWN DISPOSABLE) ×3 IMPLANT
GOWN STRL REUS W/ TWL LRG LVL3 (GOWN DISPOSABLE) ×1 IMPLANT
GOWN STRL REUS W/TWL LRG LVL3 (GOWN DISPOSABLE) ×2
GUIDEWIRE THREADED 2.8 (WIRE) ×9 IMPLANT
HOLDER FOLEY CATH W/STRAP (MISCELLANEOUS) IMPLANT
MAT ABSORB  FLUID 56X50 GRAY (MISCELLANEOUS) ×2
MAT ABSORB FLUID 56X50 GRAY (MISCELLANEOUS) ×1 IMPLANT
NS IRRIG 1000ML POUR BTL (IV SOLUTION) ×3 IMPLANT
PACK HIP COMPR (MISCELLANEOUS) ×3 IMPLANT
SCREW CANN 16 THRD/75 7.3 (Screw) ×6 IMPLANT
SCREW CANN 16 THRD/80 7.3 (Screw) ×3 IMPLANT
SCREW CANN 16 THRD/90 7.3 (Screw) ×3 IMPLANT
STAPLER SKIN PROX 35W (STAPLE) ×3 IMPLANT
STRAP SAFETY 5IN WIDE (MISCELLANEOUS) ×3 IMPLANT
SUT VIC AB 0 CT1 36 (SUTURE) ×3 IMPLANT
SUT VIC AB 2-0 CT2 27 (SUTURE) ×3 IMPLANT
SUT VICRYL 0 AB UR-6 (SUTURE) ×6 IMPLANT
TRAY FOLEY MTR SLVR 16FR STAT (SET/KITS/TRAYS/PACK) ×3 IMPLANT
WASHER 13.0MM (Orthopedic Implant) ×3 IMPLANT

## 2019-09-09 NOTE — Op Note (Signed)
09/09/2019  3:04 PM  PATIENT:  Dana Love    PRE-OPERATIVE DIAGNOSIS:  Right Femoral Neck hip Fracture  POST-OPERATIVE DIAGNOSIS:  Same  PROCEDURE:  CANNULATED SCREW FIXATION FOR RIGHT FEMORAL NECK HIP FRACTURE  SURGEON:  Juanell Fairly, MD  ANESTHESIA:   Spinal  IMPLANTS:  Synthes 7.3 mm cannulated screws x3  PREOPERATIVE INDICATIONS:  Dana Love is a  86 y.o. female who fell and was found to have a diagnosis of Right femoral neck hip Fracture .  Percutaneous fixation has been recommended for fixation of this impacted fracture.      The risks benefits and alternatives were discussed with the patient preoperatively including but not limited to infection, bleeding, nerve or blood vessel injury, persistent hip pain,  malunion, nonunion, avascular necrosis, change in lower extremity rotation, leg length discrepancy, failure of the hardware and the need for revision surgery, including the potential for conversion to hemi or total hip arthroplasty. Medical risks include but are not limited to: DVT and pulmonary embolism, myocardial infarction, stroke, pneumonia, respiratory failure and death.  The patient understood and agreed with the plan for surgery.  Patient had the right hip marked with the word yes and my initials according the hospitals correct site of surgery protocol.  OPERATIVE IMPLANTS: 7.3 mm cannulated screws x 3  OPERATIVE FINDINGS: Impacted right femoral neck hip fracture  OPERATIVE PROCEDURE: The patient was brought to the operating room and a spinal anesthetic was administered by the anesthesia service.  The patient was then placed supine on the fracture table. IV Kefzol 2 g IV was administered. The right lower extremity was positioned in a leg holder, without any significant reduction maneuver other than mild internal rotation.  The well leg was placed in hemi-lithotomy position. The hip was prepped and draped in usual sterile fashion.  A time out was performed to  verify the patient's name, date of birth, medical record number, correct site of surgery and correct surger to be performed. The  timeout was also used to verify the patient had received antibiotics that all appropriate instruments, implants and radiographic studies were available in the room. Once all in attendance were in agreement case began..  Once the reduction was deemed near-anatomic, a small lateral incision was made in line with the femur, distal to the greater trochanter, and 3 threaded guidewires were introduced Into the lateral cortex of the femur, across the fracture site and into the femoral head in an inverted triangle configuration. The lengths of these guidepins were measured to be 90 mm, 80 mm and 75 mm by depth gauge. The lateral cortex was then opened with a cannulated drill, and then the cannulated screws were advanced into position and tightened by hand. Solid fixation was achieved.  The guide pins were then removed and final C-arm images were taken of the fracture fixation. The fracture was well reduced and the hardware in good position.  The wound was irrigated copiously, and the deep and subcutaneous tissues were repaired with 0 and 2-0 Vicryl suture respectively and the skin was approximated with staples.  I was scrubbed and present the entire case and all sharp and instrument counts were correct at the conclusion of the case.  I spoke with the patient's son in the postop consultation room to let him know the case was completed without complication and the patient was stable in the recovery room.    Kathreen Devoid, MD

## 2019-09-09 NOTE — Anesthesia Procedure Notes (Signed)
Date/Time: 09/09/2019 1:09 PM Performed by: Henrietta Hoover, CRNA Pre-anesthesia Checklist: Patient identified, Emergency Drugs available, Suction available, Patient being monitored and Timeout performed Patient Re-evaluated:Patient Re-evaluated prior to induction Oxygen Delivery Method: Simple face mask Placement Confirmation: positive ETCO2

## 2019-09-09 NOTE — Anesthesia Postprocedure Evaluation (Signed)
Anesthesia Post Note  Patient: Dana Love  Procedure(s) Performed: CANNULATED HIP PINNING (Right Hip)  Patient location during evaluation: PACU Anesthesia Type: Spinal Level of consciousness: awake and alert Pain management: pain level controlled Vital Signs Assessment: post-procedure vital signs reviewed and stable Respiratory status: spontaneous breathing and respiratory function stable Cardiovascular status: stable Anesthetic complications: no   No complications documented.   Last Vitals:  Vitals:   09/09/19 1205 09/09/19 1453  BP: (!) 157/97 131/66  Pulse: 85 (!) 54  Resp: 12 10  Temp: 36.9 C 36.4 C  SpO2: 96% 93%    Last Pain:  Vitals:   09/09/19 1205  TempSrc: Temporal  PainSc:     LLE Motor Response: No movement due to regional block (09/09/19 1500)   RLE Motor Response: No movement due to regional block (09/09/19 1500)        Paw Karstens K

## 2019-09-09 NOTE — Progress Notes (Addendum)
Progress Note    Dana Love  GMW:[id number] DOB: [birth year]  DOA: 09/08/2019 PCP: Barbette Reichmann, MD      Brief Narrative:    Medical records reviewed and are as summarized below:  Dana Love is a 86 y.o. female  with history of Alzheimer dementia, dyslipidemia who present to the hospital after a fall.  X-ray showed a right hip fracture      Assessment/Plan:   Active Problems:   Closed right hip fracture (HCC)   Right hip fracture Elevated blood pressure is likely due to pain. Alzheimer's dementia Dyslipidemia  Body mass index is 20.79 kg/m.  Diet Order            Diet NPO time specified  Diet effective now                  PLAN  She is n.p.o. for surgery.  Continue IV fluids. Analgesics as needed for pain. Plan for surgery on right hip today.  Follow-up with orthopedic surgeon. Monitor BP closely.  No antihypertensives for now. Continue Lipitor, sertraline and Aricept.    Medications:   . [MAR Hold] atorvastatin  10 mg Oral Daily  . [MAR Hold] Chlorhexidine Gluconate Cloth  6 each Topical Q0600  . [MAR Hold] donepezil  10 mg Oral QHS  . [MAR Hold] mupirocin ointment  1 application Nasal BID  . [MAR Hold] raloxifene  60 mg Oral Daily  . [MAR Hold] sertraline  25 mg Oral Daily  . [MAR Hold] vitamin B-12  500 mcg Oral Daily   Continuous Infusions: . [MAR Hold]  ceFAZolin (ANCEF) IV    . lactated ringers 75 mL/hr at 09/09/19 1129     Anti-infectives (From admission, onward)   Start     Dose/Rate Route Frequency Ordered Stop   09/09/19 0903  [MAR Hold]  ceFAZolin (ANCEF) IVPB 2g/100 mL premix     Discontinue     (MAR Hold since Wed 09/09/2019 at 1201.Hold Reason: Transfer to a Procedural area.)   2 g 200 mL/hr over 30 Minutes Intravenous 30 min pre-op 09/09/19 0903               Family Communication/Anticipated D/C date and plan/Code Status   DVT prophylaxis: SCDs Start: 09/08/19 1708     Code Status: Full  Code  Family Communication:  Disposition Plan:    Status is: Inpatient  Remains inpatient appropriate because:Inpatient level of care appropriate due to severity of illness   Dispo: The patient is from: Home              Anticipated d/c is to: SNF              Anticipated d/c date is: 3 days              Patient currently is not medically stable to d/c.           Subjective:   C/o severe right hip pain.  No other complaints.  No shortness of breath or chest pain.  Objective:    Vitals:   09/09/19 0332 09/09/19 0636 09/09/19 0817 09/09/19 1205  BP: (!) 147/64 (!) 151/65 (!) 146/82 (!) 157/97  Pulse: 68 66 71 85  Resp: 18 18 17 12   Temp: 98.1 F (36.7 C) 98 F (36.7 C) 98.2 F (36.8 C) 98.5 F (36.9 C)  TempSrc: Oral Oral  Temporal  SpO2: 92% 94% 94% 96%  Weight:      Height:  No data found.  No intake or output data in the 24 hours ending 09/09/19 1317 Filed Weights   09/08/19 1540  Weight: 49.9 kg    Exam:  GEN: NAD SKIN: No rash EYES: EOMI ENT: MMM CV: RRR PULM: CTA B ABD: soft, ND, NT, +BS CNS: AAO x 3, non focal EXT: Right hip tenderness.  No swelling or erythema noted.   Data Reviewed:   I have personally reviewed following labs and imaging studies:  Labs: Labs show the following:   Basic Metabolic Panel: Recent Labs  Lab 09/08/19 1734 09/09/19 0522  NA 137 136  K 3.6 3.6  CL 106 102  CO2 25 24  GLUCOSE 100* 96  BUN 16 16  CREATININE 0.70 0.77  CALCIUM 7.5* 8.5*   GFR Estimated Creatinine Clearance: 38.8 mL/min (by C-G formula based on SCr of 0.77 mg/dL). Liver Function Tests: Recent Labs  Lab 09/08/19 1734  AST 21  ALT 15  ALKPHOS 37*  BILITOT 0.6  PROT 5.8*  ALBUMIN 3.3*   No results for input(s): LIPASE, AMYLASE in the last 168 hours. No results for input(s): AMMONIA in the last 168 hours. Coagulation profile Recent Labs  Lab 09/09/19 0925  INR 1.0    CBC: Recent Labs  Lab 09/08/19 1734  09/09/19 0522  WBC 8.6 9.2  NEUTROABS 6.4  --   HGB 13.0 12.3  HCT 38.3 37.5  MCV 91.0 93.3  PLT 143* 129*   Cardiac Enzymes: No results for input(s): CKTOTAL, CKMB, CKMBINDEX, TROPONINI in the last 168 hours. BNP (last 3 results) No results for input(s): PROBNP in the last 8760 hours. CBG: No results for input(s): GLUCAP in the last 168 hours. D-Dimer: No results for input(s): DDIMER in the last 72 hours. Hgb A1c: No results for input(s): HGBA1C in the last 72 hours. Lipid Profile: No results for input(s): CHOL, HDL, LDLCALC, TRIG, CHOLHDL, LDLDIRECT in the last 72 hours. Thyroid function studies: No results for input(s): TSH, T4TOTAL, T3FREE, THYROIDAB in the last 72 hours.  Invalid input(s): FREET3 Anemia work up: No results for input(s): VITAMINB12, FOLATE, FERRITIN, TIBC, IRON, RETICCTPCT in the last 72 hours. Sepsis Labs: Recent Labs  Lab 09/08/19 1734 09/09/19 0522  WBC 8.6 9.2    Microbiology Recent Results (from the past 240 hour(s))  SARS Coronavirus 2 by RT PCR (hospital order, performed in [facility] hospital lab) Nasopharyngeal Nasopharyngeal Swab     Status: None   Collection Time: 09/08/19  5:25 PM   Specimen: Nasopharyngeal Swab  Result Value Ref Range Status   SARS Coronavirus 2 NEGATIVE NEGATIVE Final    Comment: (NOTE) SARS-CoV-2 target nucleic acids are NOT DETECTED.  The SARS-CoV-2 RNA is generally detectable in upper and lower respiratory specimens during the acute phase of infection. The lowest concentration of SARS-CoV-2 viral copies this assay can detect is 250 copies / mL. A negative result does not preclude SARS-CoV-2 infection and should not be used as the sole basis for treatment or other patient management decisions.  A negative result may occur with improper specimen collection / handling, submission of specimen other than nasopharyngeal swab, presence of viral mutation(s) within the areas targeted by this assay, and inadequate  number of viral copies (<250 copies / mL). A negative result must be combined with clinical observations, patient history, and epidemiological information.  Fact Sheet for Patients:   BoilerBrush.com.cy  Fact Sheet for Healthcare Providers: https://pope.com/  This test is not yet approved or  cleared by the [place]  FDA and has been authorized for detection and/or diagnosis of SARS-CoV-2 by FDA under an Emergency Use Authorization (EUA).  This EUA will remain in effect (meaning this test can be used) for the duration of the COVID-19 declaration under Section 564(b)(1) of the Act, 21 U.S.C. section 360bbb-3(b)(1), unless the authorization is terminated or revoked sooner.  Performed at [facility], [address] [place], [place] [address]   Surgical pcr screen     Status: Abnormal   Collection Time: 09/08/19  6:01 PM   Specimen: Nasal Mucosa; Nasal Swab  Result Value Ref Range Status   MRSA, PCR POSITIVE (A) NEGATIVE Final   Staphylococcus aureus POSITIVE (A) NEGATIVE Final    Comment: RESULT CALLED TO, READ BACK BY AND VERIFIED WITH: Cammy Copa 09/08/19 AT 1958 BY ACR (NOTE) The Xpert SA Assay (FDA approved for NASAL specimens in patients 16 years of age and older), is one component of a comprehensive surveillance program. It is not intended to diagnose infection nor to guide or monitor treatment. Performed at [facility], [address] [place], [place] [address]     Procedures and diagnostic studies:  CT Head Wo Contrast  Result Date: 09/08/2019 CLINICAL DATA:  Fall EXAM: CT HEAD WITHOUT CONTRAST TECHNIQUE: Contiguous axial images were obtained from the base of the skull through the vertex without intravenous contrast. COMPARISON:  Correlation made with MRI from 2020 FINDINGS: Brain: There is no acute intracranial hemorrhage, mass effect, or edema. Gray-white differentiation is preserved. There  is no extra-axial fluid collection. Prominence of the ventricles and sulci similar to the prior MRI. There is somewhat disproportionate ventricular prominence, which may reflect central volume loss or communicating hydrocephalus in the appropriate setting. Patchy hypoattenuation in the supratentorial white matter is nonspecific but may reflect mild microvascular ischemic changes. Vascular: There is atherosclerotic calcification at the skull base. Skull: Calvarium is unremarkable. Sinuses/Orbits: No acute finding. Other: None. IMPRESSION: No evidence of acute intracranial injury. Electronically Signed   By: Guadlupe Spanish M.D.   On: 09/08/2019 16:46   DG Chest Portable 1 View  Result Date: 09/08/2019 CLINICAL DATA:  The patient suffered a trip and fall over a shoe string today. EXAM: PORTABLE CHEST 1 VIEW COMPARISON:  None. FINDINGS: Single, small calcified granulomas are seen in both the right and left chest. Lungs otherwise clear. Heart size normal. No pneumothorax or pleural effusion. No acute or focal bony abnormality. IMPRESSION: No acute disease. Electronically Signed   By: Drusilla Kanner M.D.   On: 09/08/2019 16:24   DG Hip Unilat W or Wo Pelvis 2-3 Views Right  Result Date: 09/08/2019 CLINICAL DATA:  86 year old female with fall and right hip pain. EXAM: DG HIP (WITH OR WITHOUT PELVIS) 2-3V RIGHT COMPARISON:  None. FINDINGS: Mildly impacted fracture of the neck of the right femur. There is no dislocation. The bones are osteopenic. There is mild bilateral hip arthritic changes. Degenerative changes of the lower lumbar spine. The soft tissues are unremarkable. IMPRESSION: Mildly impacted fracture of the right femoral neck. Electronically Signed   By: Elgie Collard M.D.   On: 09/08/2019 16:20               LOS: 1 day   Kerstie Agent  [facility]   Pager on [email or web address]. If 7PM-7AM, please contact night-coverage at [email or web address]     09/09/2019, 1:17 PM

## 2019-09-09 NOTE — Anesthesia Procedure Notes (Signed)
Spinal  Patient location during procedure: OR Staffing Performed: resident/CRNA  Anesthesiologist: Gunnar Fusi, MD Resident/CRNA: Allean Found, CRNA Preanesthetic Checklist Completed: patient identified, IV checked, site marked, risks and benefits discussed, surgical consent, monitors and equipment checked, pre-op evaluation and timeout performed Spinal Block Patient position: left lateral decubitus Prep: Betadine Patient monitoring: heart rate, continuous pulse ox, blood pressure and cardiac monitor Approach: midline Location: L4-5 Injection technique: single-shot Needle Needle type: Quincke  Needle gauge: 22 G Needle length: 9 cm Assessment Sensory level: T6 Additional Notes Negative paresthesia. Negative blood return. Positive free-flowing CSF. Expiration date of kit checked and confirmed. Patient tolerated procedure well, without complications.

## 2019-09-09 NOTE — Progress Notes (Signed)
Pharmacy Consult  Enoxaparin for VTE prophylaxis  Wt 49.9 kg CrCl 38.8 ml/min  Will continue with MD orders for enoxaparin 40 mg  q24h to start 8/19 at 0800  Crist Fat, PharmD, BCPS Clinical Pharmacist 09/09/2019 5:21 PM

## 2019-09-09 NOTE — Anesthesia Preprocedure Evaluation (Signed)
Anesthesia Evaluation  Patient identified by MRN, date of birth, ID band Patient awake and Patient confused    Reviewed: Allergy & Precautions, NPO status , Patient's Chart, lab work & pertinent test results  History of Anesthesia Complications (+) history of anesthetic complications  Airway Mallampati: II       Dental   Pulmonary neg sleep apnea, neg COPD, Not current smoker,           Cardiovascular (-) hypertension(-) Past MI and (-) CHF (-) dysrhythmias (-) Valvular Problems/Murmurs     Neuro/Psych neg Seizures Dementia    GI/Hepatic Neg liver ROS, neg GERD  ,  Endo/Other  neg diabetes  Renal/GU negative Renal ROS     Musculoskeletal   Abdominal   Peds  Hematology   Anesthesia Other Findings   Reproductive/Obstetrics                             Anesthesia Physical Anesthesia Plan  ASA: III  Anesthesia Plan: Spinal   Post-op Pain Management:    Induction:   PONV Risk Score and Plan:   Airway Management Planned:   Additional Equipment:   Intra-op Plan:   Post-operative Plan:   Informed Consent: I have reviewed the patients History and Physical, chart, labs and discussed the procedure including the risks, benefits and alternatives for the proposed anesthesia with the patient or authorized representative who has indicated his/her understanding and acceptance.       Plan Discussed with:   Anesthesia Plan Comments:         Anesthesia Quick Evaluation

## 2019-09-09 NOTE — Transfer of Care (Signed)
Immediate Anesthesia Transfer of Care Note  Patient: Dana Love  Procedure(s) Performed: CANNULATED HIP PINNING (Right Hip)  Patient Location: PACU  Anesthesia Type:Spinal  Level of Consciousness: awake and alert   Airway & Oxygen Therapy: Patient Spontanous Breathing and Patient connected to face mask oxygen  Post-op Assessment: Report given to RN and Post -op Vital signs reviewed and stable  Post vital signs: Reviewed and stable  Last Vitals:  Vitals Value Taken Time  BP    Temp    Pulse 63 09/09/19 1456  Resp 16 09/09/19 1456  SpO2 96 % 09/09/19 1456  Vitals shown include unvalidated device data.  Last Pain:  Vitals:   09/09/19 1205  TempSrc: Temporal  PainSc:          Complications: No complications documented.

## 2019-09-09 NOTE — Consult Note (Signed)
ORTHOPAEDIC CONSULTATION  REQUESTING PHYSICIAN: Lurene Shadow, MD  Chief Complaint: Right impacted femoral neck hip fracture status post fall  HPI: Dana Love is a 86 y.o. female who complains of right hip pain after a fall while walking at [facility].  X-rays in the [facility] emergency department demonstrated an impacted right femoral neck hip fracture.  Patient is admitted to the hospital service and orthopedics is consulted for management of her hip fracture.  Patient son is at the bedside this morning.  Past Medical History:  Diagnosis Date  . Alzheimer disease (HCC)    History reviewed. No pertinent surgical history. Social History   Socioeconomic History  . Marital status: Widowed    Spouse name: Not on file  . Number of children: Not on file  . Years of education: Not on file  . Highest education level: Not on file  Occupational History  . Not on file  Tobacco Use  . Smoking status: Never Smoker  . Smokeless tobacco: Never Used  Substance and Sexual Activity  . Alcohol use: Never  . Drug use: Never  . Sexual activity: Not on file  Other Topics Concern  . Not on file  Social History Narrative  . Not on file   Social Determinants of Health   Financial Resource Strain:   . Difficulty of Paying Living Expenses:   Food Insecurity:   . Worried About Programme researcher, broadcasting/film/video in the Last Year:   . Barista in the Last Year:   Transportation Needs:   . Freight forwarder (Medical):   Marland Kitchen Lack of Transportation (Non-Medical):   Physical Activity:   . Days of Exercise per Week:   . Minutes of Exercise per Session:   Stress:   . Feeling of Stress :   Social Connections:   . Frequency of Communication with Friends and Family:   . Frequency of Social Gatherings with Friends and Family:   . Attends Religious Services:   . Active Member of Clubs or Organizations:   . Attends Banker Meetings:   Marland Kitchen Marital Status:    History reviewed. No  pertinent family history. No Known Allergies Prior to Admission medications   Medication Sig Start Date End Date Taking? Authorizing Provider  ascorbic acid (VITAMIN C) 500 MG tablet Take 500 mg by mouth 2 (two) times daily.   Yes [provider]  atorvastatin (LIPITOR) 10 MG tablet Take 10 mg by mouth daily.   Yes [provider]  Cholecalciferol (VITAMIN D) 50 MCG (2000 UT) tablet Take 2,000 Units by mouth daily.   Yes [provider]  cyanocobalamin 1000 MCG tablet Take 1,000 mcg by mouth daily.    Yes [provider]  donepezil (ARICEPT) 10 MG tablet Take 10 mg by mouth at bedtime.   Yes [provider]  raloxifene (EVISTA) 60 MG tablet Take 60 mg by mouth daily.   Yes [provider]  sertraline (ZOLOFT) 100 MG tablet Take 100 mg by mouth at bedtime.    Yes [provider]   CT Head Wo Contrast  Result Date: 09/08/2019 CLINICAL DATA:  Fall EXAM: CT HEAD WITHOUT CONTRAST TECHNIQUE: Contiguous axial images were obtained from the base of the skull through the vertex without intravenous contrast. COMPARISON:  Correlation made with MRI from 2020 FINDINGS: Brain: There is no acute intracranial hemorrhage, mass effect, or edema. Gray-white differentiation is preserved. There is no extra-axial fluid collection. Prominence of the ventricles and sulci  similar to the prior MRI. There is somewhat disproportionate ventricular prominence, which may reflect central volume loss or communicating hydrocephalus in the appropriate setting. Patchy hypoattenuation in the supratentorial white matter is nonspecific but may reflect mild microvascular ischemic changes. Vascular: There is atherosclerotic calcification at the skull base. Skull: Calvarium is unremarkable. Sinuses/Orbits: No acute finding. Other: None. IMPRESSION: No evidence of acute intracranial injury. Electronically Signed   By: Guadlupe Spanish M.D.   On: 09/08/2019 16:46   DG Chest Portable 1  View  Result Date: 09/08/2019 CLINICAL DATA:  The patient suffered a trip and fall over a shoe string today. EXAM: PORTABLE CHEST 1 VIEW COMPARISON:  None. FINDINGS: Single, small calcified granulomas are seen in both the right and left chest. Lungs otherwise clear. Heart size normal. No pneumothorax or pleural effusion. No acute or focal bony abnormality. IMPRESSION: No acute disease. Electronically Signed   By: Drusilla Kanner M.D.   On: 09/08/2019 16:24   DG Hip Unilat W or Wo Pelvis 2-3 Views Right  Result Date: 09/08/2019 CLINICAL DATA:  86 year old female with fall and right hip pain. EXAM: DG HIP (WITH OR WITHOUT PELVIS) 2-3V RIGHT COMPARISON:  None. FINDINGS: Mildly impacted fracture of the neck of the right femur. There is no dislocation. The bones are osteopenic. There is mild bilateral hip arthritic changes. Degenerative changes of the lower lumbar spine. The soft tissues are unremarkable. IMPRESSION: Mildly impacted fracture of the right femoral neck. Electronically Signed   By: Elgie Collard M.D.   On: 09/08/2019 16:20    Positive ROS: All other systems have been reviewed and were otherwise negative with the exception of those mentioned in the HPI and as above.  Physical Exam: General: Alert, no acute distress  MUSCULOSKELETAL: Right lower extremity: Patient skin is intact.  There is no erythema ecchymosis or significant swelling.  Compartments are soft and compressible.  Patient has palpable pedal pulses, intact sensation light touch intact motor function distally.  Compression stockings are in place.  Assessment: Right impacted femoral neck hip fracture  Plan: I explained to the patient and her son the nature of her fracture.  I have recommended percutaneous fixation with cannulated screws for this fracture.  I reviewed the details of the operation as well as the postoperative course with them.  I also discussed the risks and benefits of surgery. The risks include but are not  limited to infection, bleeding, nerve or blood vessel injury, joint stiffness or loss of motion, persistent pain, weakness or instability, malunion, nonunion and hardware failure and the need for further surgery including conversion to a hemi versus total hip arthroplasty. Medical risks include but are not limited to DVT and pulmonary embolism, myocardial infarction, stroke, pneumonia, respiratory failure and death. Patient understood these risks and wished to proceed.   I have reviewed the patient's labs and radiographic studies in preparation for this case.  She has been given medical clearance for surgery.  Patient has Alzheimer's dementia and her son is the power of attorney who will sign consent for surgery today.  He was in agreement with the plan for surgical fixation of the fracture.   Juanell Fairly, MD    09/09/2019 9:05 AM

## 2019-09-10 ENCOUNTER — Encounter: Payer: Self-pay | Admitting: Orthopedic Surgery

## 2019-09-10 LAB — CBC WITH DIFFERENTIAL/PLATELET
Abs Immature Granulocytes: 0.04 10*3/uL (ref 0.00–0.07)
Basophils Absolute: 0 10*3/uL (ref 0.0–0.1)
Basophils Relative: 0 %
Eosinophils Absolute: 0.2 10*3/uL (ref 0.0–0.5)
Eosinophils Relative: 2 %
HCT: 35.7 % — ABNORMAL LOW (ref 36.0–46.0)
Hemoglobin: 12.1 g/dL (ref 12.0–15.0)
Immature Granulocytes: 0 %
Lymphocytes Relative: 19 %
Lymphs Abs: 1.9 10*3/uL (ref 0.7–4.0)
MCH: 30.9 pg (ref 26.0–34.0)
MCHC: 33.9 g/dL (ref 30.0–36.0)
MCV: 91.3 fL (ref 80.0–100.0)
Monocytes Absolute: 0.8 10*3/uL (ref 0.1–1.0)
Monocytes Relative: 8 %
Neutro Abs: 7.2 10*3/uL (ref 1.7–7.7)
Neutrophils Relative %: 71 %
Platelets: 124 10*3/uL — ABNORMAL LOW (ref 150–400)
RBC: 3.91 MIL/uL (ref 3.87–5.11)
RDW: 13.7 % (ref 11.5–15.5)
WBC: 10.3 10*3/uL (ref 4.0–10.5)
nRBC: 0 % (ref 0.0–0.2)

## 2019-09-10 LAB — BASIC METABOLIC PANEL
Anion gap: 8 (ref 5–15)
BUN: 15 mg/dL (ref 8–23)
CO2: 26 mmol/L (ref 22–32)
Calcium: 7.9 mg/dL — ABNORMAL LOW (ref 8.9–10.3)
Chloride: 103 mmol/L (ref 98–111)
Creatinine, Ser: 0.8 mg/dL (ref 0.44–1.00)
GFR calc Af Amer: >60 mL/min (ref >60)
GFR calc non Af Amer: >60 mL/min (ref >60)
Glucose, Bld: 116 mg/dL — ABNORMAL HIGH (ref 70–99)
Potassium: 3.6 mmol/L (ref 3.5–5.1)
Sodium: 137 mmol/L (ref 135–145)

## 2019-09-10 NOTE — TOC Initial Note (Signed)
Transition of Care [facility]) - Initial/Assessment Note    Patient Details  Name: Dana Love MRN: [id number] Date of Birth: April 06, 1932  Transition of Care [facility]) CM/SW Contact:    Trenton Founds, RN Phone Number: 09/10/2019, 2:40 PM  Clinical Narrative:     RNCM attempted to complete assessment with son by phone however he was unable to talk due to traveling at that time. Son is agreeable to bed search being initiated and requests it be sent to [facility]. RNCM will follow up with full assessment tomorrow am.                   Patient Goals and CMS Choice        Expected Discharge Plan and Services                                                Prior Living Arrangements/Services                       Activities of Daily Living Home Assistive Devices/Equipment: None ADL Screening (condition at time of admission) Patient's cognitive ability adequate to safely complete daily activities?: No Is the patient deaf or have difficulty hearing?: Yes Does the patient have difficulty seeing, even when wearing glasses/contacts?: Yes Does the patient have difficulty concentrating, remembering, or making decisions?: Yes Patient able to express need for assistance with ADLs?: Yes Does the patient have difficulty dressing or bathing?: Yes Independently performs ADLs?: No Communication: Independent Dressing (OT): Needs assistance Is this a change from baseline?: Pre-admission baseline Grooming: Needs assistance Is this a change from baseline?: Pre-admission baseline Feeding: Independent Bathing: Needs assistance Is this a change from baseline?: Pre-admission baseline Toileting: Needs assistance Is this a change from baseline?: Pre-admission baseline In/Out Bed: Needs assistance Is this a change from baseline?: Pre-admission baseline Walks in Home: Needs assistance Is this a change from baseline?: Pre-admission baseline Does the patient have difficulty walking or  climbing stairs?: Yes Weakness of Legs: Both Weakness of Arms/Hands: Both  Permission Sought/Granted                  Emotional Assessment              Admission diagnosis:  Closed right hip fracture (HCC) [S72.001A] Fall, initial encounter [W19.XXXA] Closed right hip fracture, initial encounter [facility]) [S72.001A] Patient Active Problem List   Diagnosis Date Noted   Closed right hip fracture (HCC) 09/08/2019   PCP:  Barbette Reichmann, MD Pharmacy:   [facility] - [place], [place] - [address] [place] [address] Phone: [phone] Fax: [phone]     Social Determinants of Health (SDOH) Interventions    Readmission Risk Interventions No flowsheet data found.

## 2019-09-10 NOTE — Evaluation (Signed)
Physical Therapy Evaluation Patient Details Name: Dana Love MRN: [id number] DOB: 1932/11/12 Today's Date: 09/10/2019   History of Present Illness  Pt is an 86 y.o. female presenting to hospital 8/17 s/p fall d/t tripping and reporting R sided hip pain.  Pt s/p 8/18 cannulated screw fixation for R femoral neck hip fx.  PMH includes Alzheimer's disease.  Clinical Impression  Prior to hospital admission, pt was independent with ambulation; lives at ALF.  Currently pt is min to mod assist semi-supine to sitting edge of bed; max assist with transfer standing from bed up to SW (d/t pt pushing backwards when standing even with max cueing); mod assist stand pivot to chair; and min to mod assist to stand from recliner up to SW.  Pt unable to maintain 25% PWB'ing status to take any steps so unable to ambulate.  Pt pleasant during session but also appearing forgetful and confused at times; impulsiveness also noted requiring assist for safety during session.  Pt would benefit from skilled PT to address noted impairments and functional limitations (see below for any additional details).  Upon hospital discharge, pt would benefit from STR.    Follow Up Recommendations SNF    Equipment Recommendations  Rolling walker with 5" wheels;3in1 (PT);Wheelchair (measurements PT);Wheelchair cushion (measurements PT)    Recommendations for Other Services OT consult     Precautions / Restrictions Precautions Precautions: Fall Restrictions Weight Bearing Restrictions: Yes RLE Weight Bearing: Partial weight bearing RLE Partial Weight Bearing Percentage or Pounds: 25%      Mobility  Bed Mobility Overal bed mobility: Needs Assistance Bed Mobility: Supine to Sit     Supine to sit: Min assist;Mod assist;HOB elevated     General bed mobility comments: assist for R LE and trunk; vc's for technique  Transfers Overall transfer level: Needs assistance Equipment used: Standard walker Transfers: Sit to/from  Stand Sit to Stand: Max assist;From elevated surface;Min assist;Mod assist         General transfer comment: x2 trials standing from bed up to SW but pt leaning backwards both times standing requiring max assist to maintain safety and position self safely back sitting onto bed; stand pivot to L bed to recliner with mod assist; x1 trial sit to stand from recliner up to SW with min to mod assist; vc's and tactile cues required for 25% PWB'ing status, UE/LE placement, and overall technique  Ambulation/Gait Ambulation/Gait assistance: Total assist Gait Distance (Feet): 0 Feet Assistive device: Standard walker       General Gait Details: with max vc's and physical assist pt unable to take any steps while maintaining 25% PWB'ing status so deferred further attempts  Stairs            Wheelchair Mobility    Modified Rankin (Stroke Patients Only)       Balance Overall balance assessment: Needs assistance Sitting-balance support: No upper extremity supported;Feet supported Sitting balance-Leahy Scale: Fair Sitting balance - Comments: pt initially with intermittent posterior lean requiring assist for sitting balance but with repositioning improved to close SBA   Standing balance support: Bilateral upper extremity supported Standing balance-Leahy Scale: Poor Standing balance comment: pt requiring B UE support for safe static standing balance                             Pertinent Vitals/Pain Pain Assessment: 0-10 Pain Score: 2  Pain Location: R hip/thigh Pain Descriptors / Indicators: Sore Pain Intervention(s): Limited activity within patient's  tolerance;Monitored during session;Premedicated before session;Repositioned  Vitals (HR and O2 on room air) stable and WFL throughout treatment session.    Home Living Family/patient expects to be discharged to:: Assisted living               Home Equipment: Dan Humphreys - standard Additional Comments: [facility]     Prior Function Level of Independence: Independent         Comments: Independent with ambulation     Hand Dominance        Extremity/Trunk Assessment   Upper Extremity Assessment Upper Extremity Assessment: Overall WFL for tasks assessed    Lower Extremity Assessment Lower Extremity Assessment: RLE deficits/detail RLE Deficits / Details: at least 3-/5 R hip flexion (limited d/t R hip pain); at least 3/5 knee flexion/extension and DF/PF AROM RLE: Unable to fully assess due to pain    Cervical / Trunk Assessment Cervical / Trunk Assessment: Normal  Communication   Communication: HOH  Cognition Arousal/Alertness: Awake/alert Behavior During Therapy: Impulsive Overall Cognitive Status: History of cognitive impairments - at baseline                                 General Comments: Oriented to person, place, and general situation      General Comments General comments (skin integrity, edema, etc.): mild drainage noted from R hip dressing.  Nursing cleared pt for participation in physical therapy.  Pt agreeable to PT session.  Pt's son present part of therapy session    Exercises Total Joint Exercises Ankle Circles/Pumps: AROM;Strengthening;Both;10 reps;Supine Quad Sets: AROM;Strengthening;Both;10 reps;Supine Short Arc Quad: AAROM;Strengthening;Right;10 reps;Supine Heel Slides: AAROM;Strengthening;Right;10 reps;Supine Hip ABduction/ADduction: AAROM;Strengthening;Right;10 reps;Supine   Assessment/Plan    PT Assessment Patient needs continued PT services  PT Problem List Decreased strength;Decreased activity tolerance;Decreased balance;Decreased mobility;Decreased knowledge of use of DME;Decreased safety awareness;Decreased knowledge of precautions;Pain;Decreased skin integrity       PT Treatment Interventions DME instruction;Gait training;Stair training;Functional mobility training;Therapeutic activities;Therapeutic exercise;Balance  training;Patient/family education    PT Goals (Current goals can be found in the Care Plan section)  Acute Rehab PT Goals Patient Stated Goal: to improve walking PT Goal Formulation: With patient Time For Goal Achievement: 09/24/19 Potential to Achieve Goals: Fair    Frequency BID   Barriers to discharge Decreased caregiver support      Co-evaluation               AM-PAC PT "6 Clicks" Mobility  Outcome Measure Help needed turning from your back to your side while in a flat bed without using bedrails?: A Little Help needed moving from lying on your back to sitting on the side of a flat bed without using bedrails?: A Lot Help needed moving to and from a bed to a chair (including a wheelchair)?: A Lot Help needed standing up from a chair using your arms (e.g., wheelchair or bedside chair)?: A Lot Help needed to walk in hospital room?: Total Help needed climbing 3-5 steps with a railing? : Total 6 Click Score: 11    End of Session Equipment Utilized During Treatment: Gait belt Activity Tolerance: Patient tolerated treatment well Patient left: in chair;with call bell/phone within reach;with chair alarm set;with nursing/sitter in room;with SCD's reapplied;Other (comment) (B heels floating via pillow support) Nurse Communication: Mobility status;Precautions;Weight bearing status PT Visit Diagnosis: Other abnormalities of gait and mobility (R26.89);Muscle weakness (generalized) (M62.81);History of falling (Z91.81);Difficulty in walking, not elsewhere classified (R26.2);Pain Pain -  Right/Left: Right Pain - part of body: Hip    Time: 3875-6433 PT Time Calculation (min) (ACUTE ONLY): 50 min   Charges:   PT Evaluation $PT Eval Low Complexity: 1 Low PT Treatments $Therapeutic Exercise: 8-22 mins $Therapeutic Activity: 8-22 mins       Hendricks Limes, PT 09/10/19, 12:33 PM

## 2019-09-10 NOTE — Progress Notes (Signed)
Physical Therapy Treatment Patient Details Name: Dana Love MRN: [id number] DOB: April 29, 1932 Today's Date: 09/10/2019    History of Present Illness Pt is an 86 y.o. female presenting to hospital 8/17 s/p fall d/t tripping and reporting R sided hip pain.  Pt s/p 8/18 cannulated screw fixation for R femoral neck hip fx.  PMH includes Alzheimer's disease.    PT Comments    Pt sitting in recliner upon PT arrival.  Therapist asked pt to recall WB'ing precautions and pt reporting she can put all of her weight through surgical LE.  Therapist re-educated pt on 25% PWB'ing precautions throughout PT session (impaired recall of precautions noted).  Pt kept talking about having 2 visitors coming to see her today (although pt already had her 1 visitor--her son--come see her today): therapist attempted to educated pt on current visitor policy but even though pt could verbalize policy after therapist educated her (multiple times during session), pt would shortly after start talking about it again without recollection of previous conversations.  Able to stand from recliner (up to SW) with min to mod assist x1 and then perform stand pivot min to mod assist x1 with SW recliner to bed with R LE NWB'ing (pt unable to maintain 25% PWB'ing otherwise).  Unable to ambulate d/t pt difficulties attempting to maintain PWB'ing with max cueing and assist.  Min to mod assist x1 to assist pt back to bed.  Will continue to focus on strengthening and progressive functional mobility as able.   Follow Up Recommendations  SNF     Equipment Recommendations  Rolling walker with 5" wheels;3in1 (PT);Wheelchair (measurements PT);Wheelchair cushion (measurements PT)    Recommendations for Other Services OT consult     Precautions / Restrictions Precautions Precautions: Fall Restrictions Weight Bearing Restrictions: Yes RLE Weight Bearing: Partial weight bearing RLE Partial Weight Bearing Percentage or Pounds: 25%     Mobility  Bed Mobility Overal bed mobility: Needs Assistance Bed Mobility: Sit to Supine     Sit to supine: Min assist;Mod assist;HOB elevated   General bed mobility comments: assist for R LE and trunk; vc's for technique; 2 assist to boost up in bed end of session  Transfers Overall transfer level: Needs assistance Equipment used: Standard walker Transfers: Sit to/from Stand;Stand Pivot Transfers Sit to Stand: Min assist;Mod assist Stand pivot transfers: Min assist;Mod assist       General transfer comment: min to mod assist to stand from recliner up to SW; stand pivot recliner to bed with SW min to mod assist (pt keeping R LE NWB'ing); min to mod assist to control descent sitting onto bed  Ambulation/Gait Ambulation/Gait assistance: Total assist Gait Distance (Feet): 0 Feet Assistive device: Standard walker       General Gait Details: with max vc's and physical assist pt unable to take any steps while maintaining 25% PWB'ing status so deferred further attempts   Stairs             Wheelchair Mobility    Modified Rankin (Stroke Patients Only)       Balance Overall balance assessment: Needs assistance Sitting-balance support: No upper extremity supported;Feet supported Sitting balance-Leahy Scale: Fair Sitting balance - Comments: steady static sitting   Standing balance support: Bilateral upper extremity supported Standing balance-Leahy Scale: Poor Standing balance comment: pt requiring B UE support for safe static standing balance  Cognition Arousal/Alertness: Awake/alert Behavior During Therapy: Impulsive Overall Cognitive Status: History of cognitive impairments - at baseline                                 General Comments: Pt appearing very forgetful during session.      Exercises Total Joint Exercises Ankle Circles/Pumps: AROM;Strengthening;Both;10 reps;Supine Quad Sets:  AROM;Strengthening;Both;10 reps;Supine Short Arc Quad: AAROM;Strengthening;Right;10 reps;Supine Heel Slides: AAROM;Strengthening;Right;10 reps;Supine Hip ABduction/ADduction: AAROM;Strengthening;Right;10 reps;Supine    General Comments General comments (skin integrity, edema, etc.): mild drainage noted from R hip dressing.  Pt agreeable to PT session.      Pertinent Vitals/Pain Pain Assessment: Faces Faces Pain Scale: Hurts a little bit Pain Location: R hip/thigh Pain Descriptors / Indicators: Sore Pain Intervention(s): Limited activity within patient's tolerance;Monitored during session;Repositioned  Vitals (HR and O2 on room air) stable and WFL throughout treatment session.    Home Living Family/patient expects to be discharged to:: Assisted living             Home Equipment: Dan Humphreys - standard Additional Comments: [facility]    Prior Function Level of Independence: Independent      Comments: Independent with ambulation   PT Goals (current goals can now be found in the care plan section) Acute Rehab PT Goals Patient Stated Goal: to improve walking PT Goal Formulation: With patient Time For Goal Achievement: 09/24/19 Potential to Achieve Goals: Fair Progress towards PT goals: Progressing toward goals    Frequency    BID      PT Plan Current plan remains appropriate    Co-evaluation              AM-PAC PT "6 Clicks" Mobility   Outcome Measure  Help needed turning from your back to your side while in a flat bed without using bedrails?: A Little Help needed moving from lying on your back to sitting on the side of a flat bed without using bedrails?: A Lot Help needed moving to and from a bed to a chair (including a wheelchair)?: A Lot Help needed standing up from a chair using your arms (e.g., wheelchair or bedside chair)?: A Lot Help needed to walk in hospital room?: Total Help needed climbing 3-5 steps with a railing? : Total 6 Click Score: 11     End of Session Equipment Utilized During Treatment: Gait belt Activity Tolerance: Patient tolerated treatment well Patient left: in bed;with call bell/phone within reach;with bed alarm set;with SCD's reapplied;Other (comment) (B heels floating via pillow support; bed in lowest position; 2 fall mats in place) Nurse Communication: Mobility status;Precautions;Weight bearing status PT Visit Diagnosis: Other abnormalities of gait and mobility (R26.89);Muscle weakness (generalized) (M62.81);History of falling (Z91.81);Difficulty in walking, not elsewhere classified (R26.2);Pain Pain - Right/Left: Right Pain - part of body: Hip     Time: 8295-6213 PT Time Calculation (min) (ACUTE ONLY): 24 min  Charges: $Therapeutic Activity: 23-37 mins                    Hendricks Limes, PT 09/10/19, 6:55 PM

## 2019-09-10 NOTE — Evaluation (Signed)
Occupational Therapy Evaluation Patient Details Name: Dana Love MRN: [id number] DOB: 1932-07-17 Today's Date: 09/10/2019    History of Present Illness Pt is an 86 y.o. female presenting to hospital 8/17 s/p fall d/t tripping and reporting R sided hip pain.  Pt s/p 8/18 cannulated screw fixation for R femoral neck hip fx.  PMH includes Alzheimer's disease.   Clinical Impression   Patient presenting with decreased I in self care, balance, functional transfers/mobility, endurance, strength, and ability to maintain precautions. Patient reports being independent PTA at ALF.Son confirmed this information. Pt very confused throughout needing multimodal cuing for redirection to attend to task. Patient currently functioning at min - mod A but unable to maintain precautions with standing and transfers. Patient will benefit from acute OT to increase overall independence in the areas of ADLs, functional mobility, and safety awareness in order to safely discharge to next venue of care.    Follow Up Recommendations  SNF    Equipment Recommendations  Other (comment) (defer to next venue of care)    Recommendations for Other Services Other (comment) (none at this time)     Precautions / Restrictions Precautions Precautions: Fall Restrictions Weight Bearing Restrictions: Yes RLE Weight Bearing: Partial weight bearing RLE Partial Weight Bearing Percentage or Pounds: 25%      Mobility Bed Mobility Overal bed mobility: Needs Assistance Bed Mobility: Sit to Supine     Supine to sit: Min assist;Mod assist;HOB elevated Sit to supine: Min assist;Mod assist;HOB elevated   General bed mobility comments: assist for R LE and trunk; vc's for technique;  Transfers Overall transfer level: Needs assistance Equipment used: Standard walker Transfers: Sit to/from [employer] Sit to Stand: Min assist;Mod assist Stand pivot transfers: Min assist;Mod assist       General transfer  comment: mod A to stand from bed with min A stand pivot transfer from bed > BSC with max multimodal cuing to manage wt bearing precautions. Pt asked to attempt toe touch to decrease weight but unable to remember with transfer.    Balance Overall balance assessment: Needs assistance Sitting-balance support: No upper extremity supported;Feet supported Sitting balance-Leahy Scale: Fair Sitting balance - Comments: steady static sitting   Standing balance support: Bilateral upper extremity supported Standing balance-Leahy Scale: Poor Standing balance comment: pt requiring B UE support for safe static standing balance         ADL either performed or assessed with clinical judgement   ADL Overall ADL's : Needs assistance/impaired     Grooming: Wash/dry hands;Wash/dry face;Sitting;Set up Grooming Details (indicate cue type and reason): while seated on [facility]      Toilet Transfer: Minimal assistance;Ambulation;RW   Toileting- Clothing Manipulation and Hygiene: Moderate assistance;Sit to/from stand Toileting - Clothing Manipulation Details (indicate cue type and reason): assist with LB clothing management and balance with hygiene        Vision Baseline Vision/History: No visual deficits              Pertinent Vitals/Pain Pain Assessment: Faces Faces Pain Scale: Hurts a little bit Pain Location: R hip/thigh Pain Descriptors / Indicators: Sore Pain Intervention(s): Limited activity within patient's tolerance;Monitored during session;Repositioned     Hand Dominance Right   Extremity/Trunk Assessment Upper Extremity Assessment Upper Extremity Assessment: Overall WFL for tasks assessed   Lower Extremity Assessment Lower Extremity Assessment: Defer to PT evaluation   Cervical / Trunk Assessment Cervical / Trunk Assessment: Normal   Communication Communication Communication: HOH   Cognition Arousal/Alertness: Awake/alert Behavior During Therapy:  Impulsive Overall Cognitive  Status: History of cognitive impairments - at baseline        General Comments: mod multimodal cuing for attention to task   General Comments  mild drainage noted from R hip dressing    Exercises Total Joint Exercises Ankle Circles/Pumps: AROM;Strengthening;Both;10 reps;Supine Quad Sets: AROM;Strengthening;Both;10 reps;Supine Short Arc Quad: AAROM;Strengthening;Right;10 reps;Supine Heel Slides: AAROM;Strengthening;Right;10 reps;Supine Hip ABduction/ADduction: AAROM;Strengthening;Right;10 reps;Supine   Shoulder Instructions      Home Living Family/patient expects to be discharged to:: Assisted living    Home Equipment: Dan Humphreys - standard   Additional Comments: [facility]      Prior Functioning/Environment Level of Independence: Independent        Comments: Independent with ambulation        OT Problem List: Decreased strength;Decreased coordination;Pain;Decreased range of motion;Decreased activity tolerance;Decreased safety awareness;Decreased cognition;Impaired balance (sitting and/or standing);Decreased knowledge of use of DME or AE;Decreased knowledge of precautions      OT Treatment/Interventions: Self-care/ADL training;Therapeutic exercise;Therapeutic activities;Neuromuscular education;Energy conservation;DME and/or AE instruction;Patient/family education;Manual therapy;Balance training    OT Goals(Current goals can be found in the care plan section) Acute Rehab OT Goals Patient Stated Goal: to get stronger OT Goal Formulation: With patient/family Time For Goal Achievement: 09/24/19 Potential to Achieve Goals: Good ADL Goals Pt Will Perform Lower Body Dressing: with min assist;sit to/from stand Pt Will Transfer to Toilet: with supervision;stand pivot transfer;ambulating Pt Will Perform Toileting - Clothing Manipulation and hygiene: with min guard assist;sit to/from stand  OT Frequency: Min 1X/week   Barriers to D/C: Other (comment)  none known at this  time          AM-PAC OT "6 Clicks" Daily Activity     Outcome Measure Help from another person eating meals?: None Help from another person taking care of personal grooming?: None Help from another person toileting, which includes using toliet, bedpan, or urinal?: A Little Help from another person bathing (including washing, rinsing, drying)?: A Little Help from another person to put on and taking off regular upper body clothing?: A Little Help from another person to put on and taking off regular lower body clothing?: A Lot 6 Click Score: 19   End of Session Equipment Utilized During Treatment: Rolling walker;Other (comment) (BSC)  Activity Tolerance: Patient tolerated treatment well Patient left: in bed;with call bell/phone within reach;with bed alarm set;with family/visitor present;Other (comment) (mats placed on floor - both sides of bed and bed lowered)  OT Visit Diagnosis: Unsteadiness on feet (R26.81);Muscle weakness (generalized) (M62.81);History of falling (Z91.81);Pain Pain - Right/Left: Right Pain - part of body: Hip                Time: 1441-1519 OT Time Calculation (min): 38 min Charges:  OT General Charges $OT Visit: 1 Visit OT Evaluation $OT Eval Moderate Complexity: 1 Mod OT Treatments $Self Care/Home Management : 23-37 mins  Jackquline Denmark, MS, OTR/L , CBIS ascom [phone]  09/10/19, 4:10 PM

## 2019-09-10 NOTE — Progress Notes (Signed)
Progress Note    ALYLAH BLAKNEY  EXH:[id number] DOB: 1932-09-07  DOA: 09/08/2019 PCP: Barbette Reichmann, MD      Brief Narrative:    Medical records reviewed and are as summarized below:  CAOIMHE Love is a 86 y.o. female  with history of Alzheimer dementia, dyslipidemia who present to the hospital after a fall.  X-ray showed a right hip fracture      Assessment/Plan:   Active Problems:   Closed right hip fracture (HCC)   Right hip fracture Elevated blood pressure has improved Alzheimer's dementia Dyslipidemia  Body mass index is 20.79 kg/m.  Diet Order            Diet regular Room service appropriate? Yes; Fluid consistency: Thin  Diet effective now                  PLAN  S/p cannulated screw fixation for right femoral neck hip fracture on 09/09/2019 Analgesics as needed for pain. Monitor BP closely.  No antihypertensives for now. Continue Lipitor, sertraline and Aricept. PT recommends discharge to SNF Plan discussed with her husband at the bedside.    Medications:   . acetaminophen  500 mg Oral Q6H  . ascorbic acid  500 mg Oral BID  . atorvastatin  10 mg Oral Daily  . Chlorhexidine Gluconate Cloth  6 each Topical Daily  . cholecalciferol  2,000 Units Oral Daily  . docusate sodium  100 mg Oral BID  . donepezil  10 mg Oral QHS  . enoxaparin (LOVENOX) injection  40 mg Subcutaneous Q24H  . ketorolac  7.5 mg Intravenous Q6H  . mupirocin ointment  1 application Nasal BID  . raloxifene  60 mg Oral Daily  . senna  1 tablet Oral BID  . sertraline  25 mg Oral Daily  . vitamin B-12  500 mcg Oral Daily   Continuous Infusions: . methocarbamol (ROBAXIN) IV       Anti-infectives (From admission, onward)   Start     Dose/Rate Route Frequency Ordered Stop   09/09/19 2000  ceFAZolin (ANCEF) IVPB 1 g/50 mL premix        1 g 100 mL/hr over 30 Minutes Intravenous Every 6 hours 09/09/19 1656 09/10/19 0429   09/09/19 0903  ceFAZolin (ANCEF) IVPB  2g/100 mL premix        2 g 200 mL/hr over 30 Minutes Intravenous 30 min pre-op 09/09/19 3810 09/09/19 1346             Family Communication/Anticipated D/C date and plan/Code Status   DVT prophylaxis: enoxaparin (LOVENOX) injection 40 mg Start: 09/10/19 0800 SCDs Start: 09/09/19 1656 Place TED hose Start: 09/09/19 1656     Code Status: Full Code  Family Communication:  Disposition Plan:    Status is: Inpatient  Remains inpatient appropriate because:Inpatient level of care appropriate due to severity of illness   Dispo: The patient is from: Home              Anticipated d/c is to: SNF              Anticipated d/c date is: 3 days              Patient currently is not medically stable to d/c.           Subjective:    Right hip pain.  She feels comfortable.  Patient is forgetful and does not remember she had surgery yesterday..  Objective:    Vitals:  09/10/19 0119 09/10/19 0533 09/10/19 0727 09/10/19 1103  BP: 129/66 (!) 137/92 (!) 171/68 118/60  Pulse: 72 74 74 66  Resp: 18 18 17 16   Temp: 98.6 F (37 C) 98 F (36.7 C) 97.6 F (36.4 C) 97.8 F (36.6 C)  TempSrc: Oral Oral    SpO2: 93% 100% 94% 95%  Weight:      Height:       No data found.   Intake/Output Summary (Last 24 hours) at 09/10/2019 1430 Last data filed at 09/10/2019 1300 Gross per 24 hour  Intake 2491.26 ml  Output 725 ml  Net 1766.26 ml   Filed Weights   09/08/19 1540  Weight: 49.9 kg    Exam:  GEN: NAD SKIN: No rash EYES: EOMI ENT: MMM CV: RRR PULM: No wheezing or rales heard ABD: soft, ND, NT, +BS CNS: AAO x 3, non focal EXT: Mild swelling and tenderness of the right hip.   Data Reviewed:   I have personally reviewed following labs and imaging studies:  Labs: Labs show the following:   Basic Metabolic Panel: Recent Labs  Lab 09/08/19 1734 09/08/19 1734 09/09/19 0522 09/10/19 0443  NA 137  --  136 137  K 3.6   < > 3.6 3.6  CL 106  --  102 103   CO2 25  --  24 26  GLUCOSE 100*  --  96 116*  BUN 16  --  16 15  CREATININE 0.70  --  0.77 0.80  CALCIUM 7.5*  --  8.5* 7.9*   < > = values in this interval not displayed.   GFR Estimated Creatinine Clearance: 38.8 mL/min (by C-G formula based on SCr of 0.8 mg/dL). Liver Function Tests: Recent Labs  Lab 09/08/19 1734  AST 21  ALT 15  ALKPHOS 37*  BILITOT 0.6  PROT 5.8*  ALBUMIN 3.3*   No results for input(s): LIPASE, AMYLASE in the last 168 hours. No results for input(s): AMMONIA in the last 168 hours. Coagulation profile Recent Labs  Lab 09/09/19 0925  INR 1.0    CBC: Recent Labs  Lab 09/08/19 1734 09/09/19 0522 09/10/19 0443  WBC 8.6 9.2 10.3  NEUTROABS 6.4  --  7.2  HGB 13.0 12.3 12.1  HCT 38.3 37.5 35.7*  MCV 91.0 93.3 91.3  PLT 143* 129* 124*   Cardiac Enzymes: No results for input(s): CKTOTAL, CKMB, CKMBINDEX, TROPONINI in the last 168 hours. BNP (last 3 results) No results for input(s): PROBNP in the last 8760 hours. CBG: No results for input(s): GLUCAP in the last 168 hours. D-Dimer: No results for input(s): DDIMER in the last 72 hours. Hgb A1c: No results for input(s): HGBA1C in the last 72 hours. Lipid Profile: No results for input(s): CHOL, HDL, LDLCALC, TRIG, CHOLHDL, LDLDIRECT in the last 72 hours. Thyroid function studies: No results for input(s): TSH, T4TOTAL, T3FREE, THYROIDAB in the last 72 hours.  Invalid input(s): FREET3 Anemia work up: No results for input(s): VITAMINB12, FOLATE, FERRITIN, TIBC, IRON, RETICCTPCT in the last 72 hours. Sepsis Labs: Recent Labs  Lab 09/08/19 1734 09/09/19 0522 09/10/19 0443  WBC 8.6 9.2 10.3    Microbiology Recent Results (from the past 240 hour(s))  SARS Coronavirus 2 by RT PCR (hospital order, performed in [facility] hospital lab) Nasopharyngeal Nasopharyngeal Swab     Status: None   Collection Time: 09/08/19  5:25 PM   Specimen: Nasopharyngeal Swab  Result Value Ref Range Status   SARS  Coronavirus 2 NEGATIVE NEGATIVE Final  Comment: (NOTE) SARS-CoV-2 target nucleic acids are NOT DETECTED.  The SARS-CoV-2 RNA is generally detectable in upper and lower respiratory specimens during the acute phase of infection. The lowest concentration of SARS-CoV-2 viral copies this assay can detect is 250 copies / mL. A negative result does not preclude SARS-CoV-2 infection and should not be used as the sole basis for treatment or other patient management decisions.  A negative result may occur with improper specimen collection / handling, submission of specimen other than nasopharyngeal swab, presence of viral mutation(s) within the areas targeted by this assay, and inadequate number of viral copies (<250 copies / mL). A negative result must be combined with clinical observations, patient history, and epidemiological information.  Fact Sheet for Patients:   BoilerBrush.com.cy  Fact Sheet for Healthcare Providers: https://pope.com/  This test is not yet approved or  cleared by the [place] FDA and has been authorized for detection and/or diagnosis of SARS-CoV-2 by FDA under an Emergency Use Authorization (EUA).  This EUA will remain in effect (meaning this test can be used) for the duration of the COVID-19 declaration under Section 564(b)(1) of the Act, 21 U.S.C. section 360bbb-3(b)(1), unless the authorization is terminated or revoked sooner.  Performed at [facility], [address], [place], [place] [address]   Surgical pcr screen     Status: Abnormal   Collection Time: 09/08/19  6:01 PM   Specimen: Nasal Mucosa; Nasal Swab  Result Value Ref Range Status   MRSA, PCR POSITIVE (A) NEGATIVE Final   Staphylococcus aureus POSITIVE (A) NEGATIVE Final    Comment: RESULT CALLED TO, READ BACK BY AND VERIFIED WITH: Cammy Copa 09/08/19 AT 1958 BY ACR (NOTE) The Xpert SA Assay (FDA approved for NASAL specimens in  patients 85 years of age and older), is one component of a comprehensive surveillance program. It is not intended to diagnose infection nor to guide or monitor treatment. Performed at [facility], [address] [place], [place] [address]     Procedures and diagnostic studies:  CT Head Wo Contrast  Result Date: 09/08/2019 CLINICAL DATA:  Fall EXAM: CT HEAD WITHOUT CONTRAST TECHNIQUE: Contiguous axial images were obtained from the base of the skull through the vertex without intravenous contrast. COMPARISON:  Correlation made with MRI from 2020 FINDINGS: Brain: There is no acute intracranial hemorrhage, mass effect, or edema. Gray-white differentiation is preserved. There is no extra-axial fluid collection. Prominence of the ventricles and sulci similar to the prior MRI. There is somewhat disproportionate ventricular prominence, which may reflect central volume loss or communicating hydrocephalus in the appropriate setting. Patchy hypoattenuation in the supratentorial white matter is nonspecific but may reflect mild microvascular ischemic changes. Vascular: There is atherosclerotic calcification at the skull base. Skull: Calvarium is unremarkable. Sinuses/Orbits: No acute finding. Other: None. IMPRESSION: No evidence of acute intracranial injury. Electronically Signed   By: Guadlupe Spanish M.D.   On: 09/08/2019 16:46   DG Chest Portable 1 View  Result Date: 09/08/2019 CLINICAL DATA:  The patient suffered a trip and fall over a shoe string today. EXAM: PORTABLE CHEST 1 VIEW COMPARISON:  None. FINDINGS: Single, small calcified granulomas are seen in both the right and left chest. Lungs otherwise clear. Heart size normal. No pneumothorax or pleural effusion. No acute or focal bony abnormality. IMPRESSION: No acute disease. Electronically Signed   By: Drusilla Kanner M.D.   On: 09/08/2019 16:24   DG Hip Port Unilat With Pelvis 1V Right  Result Date: 09/09/2019 CLINICAL DATA:  Fixation of  right hip fracture. EXAM: DG HIP (WITH OR WITHOUT PELVIS) 1V PORT RIGHT; OPERATIVE RIGHT HIP WITH PELVIS COMPARISON:  Radiographs 09/08/2019 FINDINGS: There are 3 cannulated hip screws fixating the patient's high femoral neck fracture. Good position and alignment without complicating features. IMPRESSION: Internal fixation of high femoral neck fracture. Electronically Signed   By: Rudie MeyerP.  Gallerani M.D.   On: 09/09/2019 16:12   DG HIP OPERATIVE UNILAT W OR W/O PELVIS RIGHT  Result Date: 09/09/2019 CLINICAL DATA:  Fixation of right hip fracture. EXAM: DG HIP (WITH OR WITHOUT PELVIS) 1V PORT RIGHT; OPERATIVE RIGHT HIP WITH PELVIS COMPARISON:  Radiographs 09/08/2019 FINDINGS: There are 3 cannulated hip screws fixating the patient's high femoral neck fracture. Good position and alignment without complicating features. IMPRESSION: Internal fixation of high femoral neck fracture. Electronically Signed   By: Rudie MeyerP.  Gallerani M.D.   On: 09/09/2019 16:12   DG Hip Unilat W or Wo Pelvis 2-3 Views Right  Result Date: 09/08/2019 CLINICAL DATA:  86 year old female with fall and right hip pain. EXAM: DG HIP (WITH OR WITHOUT PELVIS) 2-3V RIGHT COMPARISON:  None. FINDINGS: Mildly impacted fracture of the neck of the right femur. There is no dislocation. The bones are osteopenic. There is mild bilateral hip arthritic changes. Degenerative changes of the lower lumbar spine. The soft tissues are unremarkable. IMPRESSION: Mildly impacted fracture of the right femoral neck. Electronically Signed   By: Elgie CollardArash  Radparvar M.D.   On: 09/08/2019 16:20               LOS: 2 days   Lindley Stachnik  [facility]   Pager on [email or web address]. If 7PM-7AM, please contact night-coverage at [email or web address]     09/10/2019, 2:30 PM

## 2019-09-10 NOTE — Progress Notes (Signed)
Subjective:  POD #1 s/p percutaneous fixation for right impacted femoral neck hip fracture.   Patient is seen sitting at the bedside with her son in the room.  Patient just completed her dinner.  Patient son explains that she got out of bed independently overnight.  Today with physical therapy she was able to get out of bed to the chair.  Patient reports no right hip pain.    Objective:   VITALS:   Vitals:   09/10/19 0533 09/10/19 0727 09/10/19 1103 09/10/19 1529  BP: (!) 137/92 (!) 171/68 118/60 (!) 121/56  Pulse: 74 74 66 74  Resp: 18 17 16 16   Temp: 98 F (36.7 C) 97.6 F (36.4 C) 97.8 F (36.6 C) 98.1 F (36.7 C)  TempSrc: Oral     SpO2: 100% 94% 95% 94%  Weight:      Height:        PHYSICAL EXAM: Right lower extremity Neurovascular intact Sensation intact distally Intact pulses distally Dorsiflexion/Plantar flexion intact Incision: dressing C/D/I No cellulitis present Compartment soft  LABS  Results for orders placed or performed during the hospital encounter of 09/08/19 (from the past 24 hour(s))  CBC with Differential/Platelet     Status: Abnormal   Collection Time: 09/10/19  4:43 AM  Result Value Ref Range   WBC 10.3 4.0 - 10.5 K/uL   RBC 3.91 3.87 - 5.11 MIL/uL   Hemoglobin 12.1 12.0 - 15.0 g/dL   HCT 09/12/19 (L) 36 - 46 %   MCV 91.3 80.0 - 100.0 fL   MCH 30.9 26.0 - 34.0 pg   MCHC 33.9 30.0 - 36.0 g/dL   RDW 28.3 66.2 - 94.7 %   Platelets 124 (L) 150 - 400 K/uL   nRBC 0.0 0.0 - 0.2 %   Neutrophils Relative % 71 %   Neutro Abs 7.2 1.7 - 7.7 K/uL   Lymphocytes Relative 19 %   Lymphs Abs 1.9 0.7 - 4.0 K/uL   Monocytes Relative 8 %   Monocytes Absolute 0.8 0 - 1 K/uL   Eosinophils Relative 2 %   Eosinophils Absolute 0.2 0 - 0 K/uL   Basophils Relative 0 %   Basophils Absolute 0.0 0 - 0 K/uL   Immature Granulocytes 0 %   Abs Immature Granulocytes 0.04 0.00 - 0.07 K/uL  Basic metabolic panel     Status: Abnormal   Collection Time: 09/10/19  4:43 AM   Result Value Ref Range   Sodium 137 135 - 145 mmol/L   Potassium 3.6 3.5 - 5.1 mmol/L   Chloride 103 98 - 111 mmol/L   CO2 26 22 - 32 mmol/L   Glucose, Bld 116 (H) 70 - 99 mg/dL   BUN 15 8 - 23 mg/dL   Creatinine, Ser 09/12/19 0.44 - 1.00 mg/dL   Calcium 7.9 (L) 8.9 - 10.3 mg/dL   GFR calc non Af Amer >60 >60 mL/min   GFR calc Af Amer >60 >60 mL/min   Anion gap 8 5 - 15    DG Hip Port Unilat With Pelvis 1V Right  Result Date: 09/09/2019 CLINICAL DATA:  Fixation of right hip fracture. EXAM: DG HIP (WITH OR WITHOUT PELVIS) 1V PORT RIGHT; OPERATIVE RIGHT HIP WITH PELVIS COMPARISON:  Radiographs 09/08/2019 FINDINGS: There are 3 cannulated hip screws fixating the patient's high femoral neck fracture. Good position and alignment without complicating features. IMPRESSION: Internal fixation of high femoral neck fracture. Electronically Signed   By: 09/10/2019 M.D.   On: 09/09/2019  16:12   DG HIP OPERATIVE UNILAT W OR W/O PELVIS RIGHT  Result Date: 09/09/2019 CLINICAL DATA:  Fixation of right hip fracture. EXAM: DG HIP (WITH OR WITHOUT PELVIS) 1V PORT RIGHT; OPERATIVE RIGHT HIP WITH PELVIS COMPARISON:  Radiographs 09/08/2019 FINDINGS: There are 3 cannulated hip screws fixating the patient's high femoral neck fracture. Good position and alignment without complicating features. IMPRESSION: Internal fixation of high femoral neck fracture. Electronically Signed   By: Rudie Meyer M.D.   On: 09/09/2019 16:12    Assessment/Plan: 1 Day Post-Op   Active Problems:   Closed right hip fracture [facility])  Patient doing well postop.  Patient's dementia will likely play a factor in her ability to remain partial weightbearing, as she already got up and started walking on the right hip last night.  Continue physical therapy.  Patient will require a skilled nursing facility upon discharge.  Her son expresses a wish to have her go to [facility].  Patient will remain on Lovenox 40 mg daily for DVT prophylaxis until  follow-up in the office.  Patient will follow up with me in the office in 10 to 14 days.    Juanell Fairly , MD 09/10/2019, 6:05 PM

## 2019-09-10 NOTE — [place] FL2 (Signed)
[place] MEDICAID FL2 LEVEL OF CARE SCREENING TOOL     IDENTIFICATION  Patient Name: Dana Love Birthdate: 05-19-1932 Sex: female Admission Date (Current Location): 09/08/2019  [place] Number:  Chiropodist and Address:  [facility], [address], [place], [place] [address]      Provider Number: [id number]  Attending Physician Name and Address:  Lurene Shadow, MD  Relative Name and Phone Number:  Kathryn Cosby [phone]    Current Level of Care: Hospital Recommended Level of Care: Skilled Nursing Facility Prior Approval Number:    Date Approved/Denied:   PASRR Number:    Discharge Plan: SNF    Current Diagnoses: Patient Active Problem List   Diagnosis Date Noted  . Closed right hip fracture (HCC) 09/08/2019    Orientation RESPIRATION BLADDER Height & Weight     Self  Normal Continent Weight: 49.9 kg Height:  5\' 1"  (154.9 cm)  BEHAVIORAL SYMPTOMS/MOOD NEUROLOGICAL BOWEL NUTRITION STATUS      Continent Diet (Regular Diet)  AMBULATORY STATUS COMMUNICATION OF NEEDS Skin   Limited Assist Verbally Surgical wounds                       Personal Care Assistance Level of Assistance  Bathing, Feeding, Dressing Bathing Assistance: Limited assistance Feeding assistance: Independent Dressing Assistance: Limited assistance     Functional Limitations Info  Sight, Hearing, Speech Sight Info: Adequate   Speech Info: Adequate    SPECIAL CARE FACTORS FREQUENCY  PT (By licensed PT), OT (By licensed OT)                    Contractures Contractures Info: Not present    Additional Factors Info  Code Status, Allergies Code Status Info: Full Allergies Info: No known allergies           Current Medications (09/10/2019):  This is the current hospital active medication list Current Facility-Administered Medications  Medication Dose Route Frequency Provider Last Rate Last Admin  . acetaminophen  (TYLENOL) tablet 325 mg  325 mg Oral Q6H PRN 09/12/2019, MD      . acetaminophen (TYLENOL) tablet 500 mg  500 mg Oral Q6H Juanell Fairly, MD   500 mg at 09/10/19 1250  . alum & mag hydroxide-simeth (MAALOX/MYLANTA) 200-200-20 MG/5ML suspension 30 mL  30 mL Oral Q4H PRN 07-31-2000, MD      . ascorbic acid (VITAMIN C) tablet 500 mg  500 mg Oral BID Juanell Fairly, MD   500 mg at 09/10/19 1039  . atorvastatin (LIPITOR) tablet 10 mg  10 mg Oral Daily 09/12/19, MD   10 mg at 09/10/19 1042  . bisacodyl (DULCOLAX) suppository 10 mg  10 mg Rectal Daily PRN 09/12/19, MD      . Chlorhexidine Gluconate Cloth 2 % PADS 6 each  6 each Topical Daily Juanell Fairly, MD   6 each at 09/10/19 1042  . cholecalciferol (VITAMIN D) tablet 2,000 Units  2,000 Units Oral Daily 09/12/19, MD   2,000 Units at 09/10/19 1039  . docusate sodium (COLACE) capsule 100 mg  100 mg Oral BID 09/12/19, MD   100 mg at 09/10/19 1039  . donepezil (ARICEPT) tablet 10 mg  10 mg Oral QHS 09/12/19, MD   10 mg at 09/09/19 2220  . enoxaparin (LOVENOX) injection 40 mg  40 mg Subcutaneous Q24H 2221, MD   40 mg at 09/10/19 0900  . HYDROcodone-acetaminophen (  NORCO) 7.5-325 MG per tablet 1 tablet  1 tablet Oral Q4H PRN Juanell Fairly, MD   1 tablet at 09/10/19 0034  . HYDROcodone-acetaminophen (NORCO/VICODIN) 5-325 MG per tablet 1 tablet  1 tablet Oral Q4H PRN Juanell Fairly, MD      . ketorolac (TORADOL) 15 MG/ML injection 7.5 mg  7.5 mg Intravenous Q6H Juanell Fairly, MD   7.5 mg at 09/10/19 1251  . magnesium citrate solution 1 Bottle  1 Bottle Oral Once PRN Juanell Fairly, MD      . methocarbamol (ROBAXIN) tablet 500 mg  500 mg Oral Q6H PRN Juanell Fairly, MD   500 mg at 09/09/19 2220   Or  . methocarbamol (ROBAXIN) 500 mg in dextrose 5 % 50 mL IVPB  500 mg Intravenous Q6H PRN Juanell Fairly, MD      . morphine 2 MG/ML injection 0.5 mg  0.5 mg Intravenous Q2H PRN  Juanell Fairly, MD   0.5 mg at 09/09/19 2222  . mupirocin ointment (BACTROBAN) 2 % 1 application  1 application Nasal BID Juanell Fairly, MD   1 application at 09/10/19 1043  . ondansetron (ZOFRAN) tablet 4 mg  4 mg Oral Q6H PRN Juanell Fairly, MD       Or  . ondansetron [facility]) injection 4 mg  4 mg Intravenous Q6H PRN Juanell Fairly, MD      . polyethylene glycol (MIRALAX / GLYCOLAX) packet 17 g  17 g Oral Daily PRN Juanell Fairly, MD      . raloxifene (EVISTA) tablet 60 mg  60 mg Oral Daily Juanell Fairly, MD   60 mg at 09/10/19 1044  . senna (SENOKOT) tablet 8.6 mg  1 tablet Oral BID Juanell Fairly, MD   8.6 mg at 09/10/19 1040  . sertraline (ZOLOFT) tablet 25 mg  25 mg Oral Daily Juanell Fairly, MD   25 mg at 09/10/19 1041  . vitamin B-12 (CYANOCOBALAMIN) tablet 500 mcg  500 mcg Oral Daily Juanell Fairly, MD   500 mcg at 09/10/19 1040     Discharge Medications: Please see discharge summary for a list of discharge medications.  Relevant Imaging Results:  Relevant Lab Results:   Additional Information SS# [id number]  Trenton Founds, RN

## 2019-09-11 LAB — CBC
HCT: 33.6 % — ABNORMAL LOW (ref 36.0–46.0)
Hemoglobin: 11.4 g/dL — ABNORMAL LOW (ref 12.0–15.0)
MCH: 30.9 pg (ref 26.0–34.0)
MCHC: 33.9 g/dL (ref 30.0–36.0)
MCV: 91.1 fL (ref 80.0–100.0)
Platelets: 120 10*3/uL — ABNORMAL LOW (ref 150–400)
RBC: 3.69 MIL/uL — ABNORMAL LOW (ref 3.87–5.11)
RDW: 13.9 % (ref 11.5–15.5)
WBC: 8.4 10*3/uL (ref 4.0–10.5)
nRBC: 0 % (ref 0.0–0.2)

## 2019-09-11 MED ORDER — HALOPERIDOL LACTATE 5 MG/ML IJ SOLN
INTRAMUSCULAR | Status: AC
  Administered 2019-09-11: 2 mg via INTRAMUSCULAR
  Filled 2019-09-11: qty 1

## 2019-09-11 MED ORDER — HALOPERIDOL LACTATE 5 MG/ML IJ SOLN: 2.0000 mg | Freq: Four times a day (QID) | INTRAMUSCULAR | Status: DC | PRN

## 2019-09-11 MED ORDER — HALOPERIDOL 0.5 MG PO TABS
0.5000 mg | ORAL_TABLET | Freq: Two times a day (BID) | ORAL | Status: AC
  Administered 2019-09-11 – 2019-09-12 (×3): 0.5 mg via ORAL
  Filled 2019-09-11 (×4): qty 1

## 2019-09-11 NOTE — Progress Notes (Signed)
  Subjective:  POD #2 s/p percutaneous fixation of left femoral neck hip fracture.   Patient reports left hip pain as mild.  Patient is confused.   She states she needs to urinate.  Objective:   VITALS:   Vitals:   09/10/19 1103 09/10/19 1529 09/11/19 0015 09/11/19 0748  BP: 118/60 (!) 121/56 (!) 149/70 132/78  Pulse: 66 74 81 84  Resp: 16 16 18 17   Temp: 97.8 F (36.6 C) 98.1 F (36.7 C) 98 F (36.7 C) 98.7 F (37.1 C)  TempSrc:   Oral Oral  SpO2: 95% 94% 91% 91%  Weight:      Height:        PHYSICAL EXAM: Right lower extremity Neurovascular intact Sensation intact distally Intact pulses distally Dorsiflexion/Plantar flexion intact Incision: dressing C/D/I No cellulitis present Compartment soft  LABS  Results for orders placed or performed during the hospital encounter of 09/08/19 (from the past 24 hour(s))  CBC     Status: Abnormal   Collection Time: 09/11/19  6:40 AM  Result Value Ref Range   WBC 8.4 4.0 - 10.5 K/uL   RBC 3.69 (L) 3.87 - 5.11 MIL/uL   Hemoglobin 11.4 (L) 12.0 - 15.0 g/dL   HCT 09/13/19 (L) 36 - 46 %   MCV 91.1 80.0 - 100.0 fL   MCH 30.9 26.0 - 34.0 pg   MCHC 33.9 30.0 - 36.0 g/dL   RDW 54.5 62.5 - 63.8 %   Platelets 120 (L) 150 - 400 K/uL   nRBC 0.0 0.0 - 0.2 %    DG Hip Port Unilat With Pelvis 1V Right  Result Date: 09/09/2019 CLINICAL DATA:  Fixation of right hip fracture. EXAM: DG HIP (WITH OR WITHOUT PELVIS) 1V PORT RIGHT; OPERATIVE RIGHT HIP WITH PELVIS COMPARISON:  Radiographs 09/08/2019 FINDINGS: There are 3 cannulated hip screws fixating the patient's high femoral neck fracture. Good position and alignment without complicating features. IMPRESSION: Internal fixation of high femoral neck fracture. Electronically Signed   By: 09/10/2019 M.D.   On: 09/09/2019 16:12    Assessment/Plan: 2 Days Post-Op   Active Problems:   Closed right hip fracture (HCC)  Continue PT with 25% weight bearing on the right lower extremity.   Patient  awaiting insurance authorization to go to [facility].  Follow up in the office in 10-14 weeks.  Continue lovenox for DVT prophylaxis until follow up.   10-17-1976 , MD 09/11/2019, 2:47 PM

## 2019-09-11 NOTE — Progress Notes (Signed)
PT Cancellation Note  Patient Details Name: Dana Love MRN: [id number] DOB: [birth year]   Cancelled Treatment:    Reason Eval/Treat Not Completed: Other (comment).  Pt resting in bed with eyes closed upon PT arrival; woken with gentle tactile cues but pt kept her eyes closed.  Pt reporting she was going to wake up for the day soon and when therapist asked pt what time she normally wakes up in the morning pt reported "8".  Therapist attempted to get pt to participate in physical therapy (ex's in bed or OOB mobility) and pt declined d/t wanting to rest.  Pt with recent pain medication and Haldol.  Will re-attempt PT treatment session tomorrow.  Hendricks Limes, PT 09/11/19, 3:21 PM

## 2019-09-11 NOTE — Care Management Important Message (Signed)
Important Message  Patient Details  Name: Dana Love MRN: [id number] Date of Birth: [birth year]   Medicare Important Message Given:  Yes     Johnell Comings 09/11/2019, 11:28 AM

## 2019-09-11 NOTE — Progress Notes (Addendum)
Progress Note    Dana Love  ASN:[id number] DOB: November 15, 1932  DOA: 09/08/2019 PCP: Barbette Reichmann, MD      Brief Narrative:    Medical records reviewed and are as summarized below:  Dana Love is a 86 y.o. female  with history of Alzheimer dementia, dyslipidemia who present to the hospital after a fall.  X-ray showed a right hip fracture      Assessment/Plan:   Active Problems:   Closed right hip fracture (HCC)   Right hip fracture Intermittent episodes of elevated blood pressure Delirium/hallucinations Alzheimer's dementia Dyslipidemia   PLAN  S/p cannulated screw fixation for right femoral neck hip fracture on 09/09/2019 Analgesics as needed for pain. Intermittent elevated BP is likely from pain.  Continue to monitor BP closely. Start low-dose Haldol for delirium.  Haldol IM as needed for agitation. Continue Lipitor, sertraline and Aricept. PT recommends discharge to SNF Plan discussed with her husband at the bedside.    Body mass index is 20.79 kg/m.  Diet Order            Diet regular Room service appropriate? Yes; Fluid consistency: Thin  Diet effective now                   Medications:   . ascorbic acid  500 mg Oral BID  . atorvastatin  10 mg Oral Daily  . Chlorhexidine Gluconate Cloth  6 each Topical Daily  . cholecalciferol  2,000 Units Oral Daily  . docusate sodium  100 mg Oral BID  . donepezil  10 mg Oral QHS  . enoxaparin (LOVENOX) injection  40 mg Subcutaneous Q24H  . haloperidol  0.5 mg Oral BID  . haloperidol lactate      . mupirocin ointment  1 application Nasal BID  . raloxifene  60 mg Oral Daily  . senna  1 tablet Oral BID  . sertraline  25 mg Oral Daily  . vitamin B-12  500 mcg Oral Daily   Continuous Infusions: . methocarbamol (ROBAXIN) IV       Anti-infectives (From admission, onward)   Start     Dose/Rate Route Frequency Ordered Stop   09/09/19 2000  ceFAZolin (ANCEF) IVPB 1 g/50 mL premix         1 g 100 mL/hr over 30 Minutes Intravenous Every 6 hours 09/09/19 1656 09/10/19 2207   09/09/19 0903  ceFAZolin (ANCEF) IVPB 2g/100 mL premix        2 g 200 mL/hr over 30 Minutes Intravenous 30 min pre-op 09/09/19 1937 09/09/19 1346             Family Communication/Anticipated D/C date and plan/Code Status   DVT prophylaxis: enoxaparin (LOVENOX) injection 40 mg Start: 09/10/19 0800 SCDs Start: 09/09/19 1656 Place TED hose Start: 09/09/19 1656     Code Status: Full Code  Family Communication:  Disposition Plan:    Status is: Inpatient  Remains inpatient appropriate because:Inpatient level of care appropriate due to severity of illness   Dispo: The patient is from: Home              Anticipated d/c is to: SNF              Anticipated d/c date is: 3 days              Patient currently is not medically stable to d/c.           Subjective:    C/o severe right hip  pain after working with physical therapist this morning.  Her nurse reports that her she was more confused and hallucinating today.  Objective:    Vitals:   09/10/19 1103 09/10/19 1529 09/11/19 0015 09/11/19 0748  BP: 118/60 (!) 121/56 (!) 149/70 132/78  Pulse: 66 74 81 84  Resp: 16 16 18 17   Temp: 97.8 F (36.6 C) 98.1 F (36.7 C) 98 F (36.7 C) 98.7 F (37.1 C)  TempSrc:   Oral Oral  SpO2: 95% 94% 91% 91%  Weight:      Height:       No data found.   Intake/Output Summary (Last 24 hours) at 09/11/2019 1427 Last data filed at 09/11/2019 1352 Gross per 24 hour  Intake 566.35 ml  Output 600 ml  Net -33.65 ml   Filed Weights   09/08/19 1540  Weight: 49.9 kg    Exam:  GEN: No acute distress SKIN: Warm and dry EYES: No pallor or icterus ENT: MMM CV: RRR PULM: Clear to auscultation bilaterally ABD: soft, ND, NT, +BS CNS: AAO x 1 (person), confused, non focal EXT: Mild swelling and tenderness of the right hip.   Data Reviewed:   I have personally reviewed following labs and  imaging studies:  Labs: Labs show the following:   Basic Metabolic Panel: Recent Labs  Lab 09/08/19 1734 09/08/19 1734 09/09/19 0522 09/10/19 0443  NA 137  --  136 137  K 3.6   < > 3.6 3.6  CL 106  --  102 103  CO2 25  --  24 26  GLUCOSE 100*  --  96 116*  BUN 16  --  16 15  CREATININE 0.70  --  0.77 0.80  CALCIUM 7.5*  --  8.5* 7.9*   < > = values in this interval not displayed.   GFR Estimated Creatinine Clearance: 38.8 mL/min (by C-G formula based on SCr of 0.8 mg/dL). Liver Function Tests: Recent Labs  Lab 09/08/19 1734  AST 21  ALT 15  ALKPHOS 37*  BILITOT 0.6  PROT 5.8*  ALBUMIN 3.3*   No results for input(s): LIPASE, AMYLASE in the last 168 hours. No results for input(s): AMMONIA in the last 168 hours. Coagulation profile Recent Labs  Lab 09/09/19 0925  INR 1.0    CBC: Recent Labs  Lab 09/08/19 1734 09/09/19 0522 09/10/19 0443 09/11/19 0640  WBC 8.6 9.2 10.3 8.4  NEUTROABS 6.4  --  7.2  --   HGB 13.0 12.3 12.1 11.4*  HCT 38.3 37.5 35.7* 33.6*  MCV 91.0 93.3 91.3 91.1  PLT 143* 129* 124* 120*   Cardiac Enzymes: No results for input(s): CKTOTAL, CKMB, CKMBINDEX, TROPONINI in the last 168 hours. BNP (last 3 results) No results for input(s): PROBNP in the last 8760 hours. CBG: No results for input(s): GLUCAP in the last 168 hours. D-Dimer: No results for input(s): DDIMER in the last 72 hours. Hgb A1c: No results for input(s): HGBA1C in the last 72 hours. Lipid Profile: No results for input(s): CHOL, HDL, LDLCALC, TRIG, CHOLHDL, LDLDIRECT in the last 72 hours. Thyroid function studies: No results for input(s): TSH, T4TOTAL, T3FREE, THYROIDAB in the last 72 hours.  Invalid input(s): FREET3 Anemia work up: No results for input(s): VITAMINB12, FOLATE, FERRITIN, TIBC, IRON, RETICCTPCT in the last 72 hours. Sepsis Labs: Recent Labs  Lab 09/08/19 1734 09/09/19 0522 09/10/19 0443 09/11/19 0640  WBC 8.6 9.2 10.3 8.4     Microbiology Recent Results (from the past 240 hour(s))  SARS  Coronavirus 2 by RT PCR (hospital order, performed in [facility] hospital lab) Nasopharyngeal Nasopharyngeal Swab     Status: None   Collection Time: 09/08/19  5:25 PM   Specimen: Nasopharyngeal Swab  Result Value Ref Range Status   SARS Coronavirus 2 NEGATIVE NEGATIVE Final    Comment: (NOTE) SARS-CoV-2 target nucleic acids are NOT DETECTED.  The SARS-CoV-2 RNA is generally detectable in upper and lower respiratory specimens during the acute phase of infection. The lowest concentration of SARS-CoV-2 viral copies this assay can detect is 250 copies / mL. A negative result does not preclude SARS-CoV-2 infection and should not be used as the sole basis for treatment or other patient management decisions.  A negative result may occur with improper specimen collection / handling, submission of specimen other than nasopharyngeal swab, presence of viral mutation(s) within the areas targeted by this assay, and inadequate number of viral copies (<250 copies / mL). A negative result must be combined with clinical observations, patient history, and epidemiological information.  Fact Sheet for Patients:   BoilerBrush.com.cy  Fact Sheet for Healthcare Providers: https://pope.com/  This test is not yet approved or  cleared by the [place] FDA and has been authorized for detection and/or diagnosis of SARS-CoV-2 by FDA under an Emergency Use Authorization (EUA).  This EUA will remain in effect (meaning this test can be used) for the duration of the COVID-19 declaration under Section 564(b)(1) of the Act, 21 U.S.C. section 360bbb-3(b)(1), unless the authorization is terminated or revoked sooner.  Performed at [facility], [address], [place], [place] [address]   Surgical pcr screen     Status: Abnormal   Collection Time: 09/08/19  6:01 PM   Specimen: Nasal  Mucosa; Nasal Swab  Result Value Ref Range Status   MRSA, PCR POSITIVE (A) NEGATIVE Final   Staphylococcus aureus POSITIVE (A) NEGATIVE Final    Comment: RESULT CALLED TO, READ BACK BY AND VERIFIED WITH: Cammy Copa 09/08/19 AT 1958 BY ACR (NOTE) The Xpert SA Assay (FDA approved for NASAL specimens in patients 28 years of age and older), is one component of a comprehensive surveillance program. It is not intended to diagnose infection nor to guide or monitor treatment. Performed at [facility], [address] [place], [place] [address]     Procedures and diagnostic studies:  DG Hip Port Unilat With Pelvis 1V Right  Result Date: 09/09/2019 CLINICAL DATA:  Fixation of right hip fracture. EXAM: DG HIP (WITH OR WITHOUT PELVIS) 1V PORT RIGHT; OPERATIVE RIGHT HIP WITH PELVIS COMPARISON:  Radiographs 09/08/2019 FINDINGS: There are 3 cannulated hip screws fixating the patient's high femoral neck fracture. Good position and alignment without complicating features. IMPRESSION: Internal fixation of high femoral neck fracture. Electronically Signed   By: Rudie Meyer M.D.   On: 09/09/2019 16:12   DG HIP OPERATIVE UNILAT W OR W/O PELVIS RIGHT  Result Date: 09/09/2019 CLINICAL DATA:  Fixation of right hip fracture. EXAM: DG HIP (WITH OR WITHOUT PELVIS) 1V PORT RIGHT; OPERATIVE RIGHT HIP WITH PELVIS COMPARISON:  Radiographs 09/08/2019 FINDINGS: There are 3 cannulated hip screws fixating the patient's high femoral neck fracture. Good position and alignment without complicating features. IMPRESSION: Internal fixation of high femoral neck fracture. Electronically Signed   By: Rudie Meyer M.D.   On: 09/09/2019 16:12               LOS: 3 days   Jeramey Lanuza  [facility]   Pager on [email or web address]. If 7PM-7AM,  please contact night-coverage at [email or web address]     09/11/2019, 2:27 PM

## 2019-09-11 NOTE — TOC Progression Note (Signed)
Transition of Care [facility]) - Progression Note    Patient Details  Name: Dana Love MRN: [id number] Date of Birth: 1932-02-18  [facility] CM/SW Contact  Trenton Founds, RN Phone Number: 09/11/2019, 10:38 AM  Clinical Narrative:   RNCM spoke with patient's son in hallway. Patient is mildly agitated this morning and son reports he feels it is due to her not having a good night. Son reports that patient lives at [facility] assisted living facility and she is doing very well there. He reports that after rehab it will be his plan for her to return there. Presented bed offers and son chose [facility]. Bed accepted in the hub and insurance authorization started through [facility]. Additional requested clinical information faxed to [employer].          Expected Discharge Plan and Services                                                 Social Determinants of Health (SDOH) Interventions    Readmission Risk Interventions No flowsheet data found.

## 2019-09-11 NOTE — Progress Notes (Signed)
Physical Therapy Treatment Patient Details Name: Dana Love MRN: [id number] DOB: 1932-05-24 Today's Date: 09/11/2019    History of Present Illness Pt is an 86 y.o. female presenting to hospital 8/17 s/p fall d/t tripping and reporting R sided hip pain.  Pt s/p 8/18 cannulated screw fixation for R femoral neck hip fx.  PMH includes Alzheimer's disease.    PT Comments    Pt resting in bed upon PT arrival.  Pt initially reporting she was there to support a family member and was not the pt.  Therapist attempted to re-orient pt throughout PT session with minimal improvement although pt agreeable to working with therapist.  Required assist for R LE ex's d/t R hip/thigh pain with movement (mild R hip/thigh pain at rest).  Mod to max assist with bed mobility and able to stand (on 2nd trial) up to SW with mod to max assist; pt requiring mod assist for standing balance and also assist to maintain 25% PWB'ing with activities.  Pt assisted back to bed end of session.  Will continue to focus on strengthening and progressive functional mobility during hospitalization.    Follow Up Recommendations  SNF     Equipment Recommendations  Rolling walker with 5" wheels;3in1 (PT);Wheelchair (measurements PT);Wheelchair cushion (measurements PT)    Recommendations for Other Services OT consult     Precautions / Restrictions Precautions Precautions: Fall Restrictions Weight Bearing Restrictions: Yes RLE Weight Bearing: Partial weight bearing RLE Partial Weight Bearing Percentage or Pounds: 25%    Mobility  Bed Mobility Overal bed mobility: Needs Assistance Bed Mobility: Supine to Sit;Sit to Supine Rolling: Max assist   Supine to sit: Mod assist;Max assist;HOB elevated Sit to supine: Mod assist;Max assist;HOB elevated   General bed mobility comments: assist for R LE>L LE; assist for trunk; vc's for technique required  Transfers Overall transfer level: Needs assistance Equipment used: Standard  walker Transfers: Sit to/from Stand Sit to Stand: Mod assist;Max assist         General transfer comment: pt unable to stand 1st attempted trial d/t c/o R hip/thigh pain but able to stand 2nd trial up to SW with mod to max assist x1 (therapist assisted pt with maintaining 25% PWB'ing status); vc's for UE/LE placement and overall technique  Ambulation/Gait             General Gait Details: pt unable to maintain static standing balance (with SW) without mod assist of therapist for balance so deferred ambulation   Stairs             Wheelchair Mobility    Modified Rankin (Stroke Patients Only)       Balance Overall balance assessment: Needs assistance Sitting-balance support: Feet supported;Single extremity supported Sitting balance-Leahy Scale: Fair Sitting balance - Comments: pt requiring at least single UE support for static sitting balance   Standing balance support: Bilateral upper extremity supported Standing balance-Leahy Scale: Poor Standing balance comment: pt requiring mod assist for standing balance (B UE support on RW)                            Cognition Arousal/Alertness: Awake/alert Behavior During Therapy: Impulsive Overall Cognitive Status: Impaired/Different from baseline Area of Impairment: Orientation;Attention;Memory;Following commands;Safety/judgement;Awareness                 Orientation Level: Disoriented to;Place;Time;Situation   Memory: Decreased recall of precautions;Decreased short-term memory Following Commands: Follows one step commands inconsistently Safety/Judgement: Decreased awareness of safety;Decreased awareness  of deficits     General Comments: Pt perseverating on R hip hurting but therapist unable to orient pt regarding recent injury and fx (pt reporting it happened "some time ago" and that she did NOT have surgery on it; pt did not remember this therapist from yesterdays sessions (or getting OOB with  therapist))      Exercises Total Joint Exercises Ankle Circles/Pumps: AROM;Strengthening;Both;10 reps;Supine Quad Sets: AROM;Strengthening;Both;10 reps;Supine Short Arc Quad: AAROM;Right;AROM;Left;Strengthening;10 reps;Supine;Both Heel Slides: AAROM;Right;AROM;Left;Strengthening;10 reps;Supine;Both Hip ABduction/ADduction: AAROM;Right;AROM;Left;Strengthening;10 reps;Supine;Both Long Arc Quad: AAROM;Right;AROM;Left;Strengthening;Both;10 reps;Seated General Exercises - Lower Extremity Hip Flexion/Marching: AAROM;Right;AROM;Left;Strengthening;Both;10 reps;Seated    General Comments General comments (skin integrity, edema, etc.): mild drainage noted from R hip dressing.  Nursing cleared pt for participation in physical therapy.  Pt agreeable to PT session.      Pertinent Vitals/Pain Pain Assessment: Faces Faces Pain Scale: Hurts a little bit Pain Location: R hip/thigh Pain Descriptors / Indicators: Discomfort;Tender;Sore;Aching Pain Intervention(s): Limited activity within patient's tolerance;Monitored during session;Repositioned  Vitals (HR and O2 on room air) stable and WFL throughout treatment session.    Home Living                      Prior Function            PT Goals (current goals can now be found in the care plan section) Acute Rehab PT Goals Patient Stated Goal: to get stronger PT Goal Formulation: With patient Time For Goal Achievement: 09/24/19 Potential to Achieve Goals: Fair Progress towards PT goals: Progressing toward goals    Frequency    BID      PT Plan Current plan remains appropriate    Co-evaluation              AM-PAC PT "6 Clicks" Mobility   Outcome Measure  Help needed turning from your back to your side while in a flat bed without using bedrails?: A Little Help needed moving from lying on your back to sitting on the side of a flat bed without using bedrails?: A Lot Help needed moving to and from a bed to a chair (including  a wheelchair)?: A Lot Help needed standing up from a chair using your arms (e.g., wheelchair or bedside chair)?: A Lot Help needed to walk in hospital room?: Total Help needed climbing 3-5 steps with a railing? : Total 6 Click Score: 11    End of Session Equipment Utilized During Treatment: Gait belt Activity Tolerance: Patient limited by pain Patient left: in bed;with call bell/phone within reach;with bed alarm set;with SCD's reapplied;Other (comment) (B heels floating via pillow support; bed in lowest position; 2 fall mats in place) Nurse Communication: Mobility status;Precautions;Weight bearing status PT Visit Diagnosis: Other abnormalities of gait and mobility (R26.89);Muscle weakness (generalized) (M62.81);History of falling (Z91.81);Difficulty in walking, not elsewhere classified (R26.2);Pain Pain - Right/Left: Right Pain - part of body: Hip     Time: 8756-4332 PT Time Calculation (min) (ACUTE ONLY): 38 min  Charges:  $Therapeutic Exercise: 23-37 mins $Therapeutic Activity: 8-22 mins                    Hendricks Limes, PT 09/11/19, 12:20 PM

## 2019-09-11 NOTE — TOC Progression Note (Signed)
Transition of Care [facility]) - Progression Note    Patient Details  Name: Dana Love MRN: [id number] Date of Birth: [birth year]  Transition of Care [facility]) CM/SW Contact  Trenton Founds, RN Phone Number: 09/11/2019, 3:16 PM  Clinical Narrative:   [facility] received insurance authorization through [place] [facility] with reference number of [id number]. Authorization is good for 5 days with next review date of 8/24. Case manager following is Sherrlyn Hock.          Expected Discharge Plan and Services                                                 Social Determinants of Health (SDOH) Interventions    Readmission Risk Interventions No flowsheet data found.

## 2019-09-11 NOTE — Progress Notes (Signed)
Occupational Therapy Treatment Patient Details Name: Dana Love MRN: [id number] DOB: [birth year] Today's Date: 09/11/2019    History of present illness Pt is an 86 y.o. female presenting to hospital 8/17 s/p fall d/t tripping and reporting R sided hip pain.  Pt s/p 8/18 cannulated screw fixation for R femoral neck hip fx.  PMH includes Alzheimer's disease.   OT comments  Dana Love was seen for OT treatment on this date. Upon arrival to room pt reclined in bed reporting need to void. Attempted exiting bed however deferred OOB mobility 2/2 pain and poor command following (son in room reports pt mentation worse than baseline this AM). Oriented to self only. Attempted to re-orient pt time/location/situation however pt unable to retain information for greater than . MAX A for toileting at bed level, MOD A don mesh briefs - assist for R buttocks. Pt continues to benefit from skilled OT services to maximize return to PLOF and minimize risk of future falls, injury, caregiver burden, and readmission. Will continue to follow POC. Discharge recommendation remains appropriate.    Follow Up Recommendations  SNF    Equipment Recommendations  Other (comment) (defer to next venue of care)    Recommendations for Other Services      Precautions / Restrictions Precautions Precautions: Fall Restrictions Weight Bearing Restrictions: Yes RLE Weight Bearing: Partial weight bearing RLE Partial Weight Bearing Percentage or Pounds: 25%       Mobility Bed Mobility Overal bed mobility: Needs Assistance Bed Mobility: Rolling Rolling: Max assist   General bed mobility comments: assist for RLE, trunk and technique   Transfers      General transfer comment: Unsafe to attempt     Balance Overall balance assessment: Needs assistance      ADL either performed or assessed with clinical judgement   ADL Overall ADL's : Needs assistance/impaired    General ADL Comments: MAX A toileting at bed  level, TOTAL A for perihygiene. MOD A don mesh underwear at bed level - asssit for pulling over R rear. TOTAL A for LBD at bed level       Cognition Arousal/Alertness: Awake/alert Behavior During Therapy: Flat affect Overall Cognitive Status: Impaired/Different from baseline Area of Impairment: Orientation;Attention;Memory;Following commands;Safety/judgement;Awareness    Orientation Level: Disoriented to;Place;Time;Situation   Memory: Decreased recall of precautions;Decreased short-term memory Following Commands: Follows one step commands inconsistently Safety/Judgement: Decreased awareness of safety;Decreased awareness of deficits     General Comments: Son in room states pt mentation worse than baseilne. When handed a comb pt stated "oh new glasses." T/o session son and OT attempted to re-oriented pt however she was unable to retain information greater than 2 mins.        Exercises Exercises: Other exercises Other Exercises Other Exercises: Pt and family educated re: re-oriented to time/place/situation, precautions, falls prevention Other Exercises: Toileting, bed mobility, don/doff mesh briefs, rolling     Pertinent Vitals/ Pain       Pain Assessment: Faces Faces Pain Scale: Hurts even more Pain Location: R hip/thigh Pain Descriptors / Indicators: Dull;Discomfort;Aching Pain Intervention(s): Limited activity within patient's tolerance;Premedicated before session   Frequency  Min 1X/week        Progress Toward Goals  OT Goals(current goals can now be found in the care plan section)  Progress towards OT goals: Not progressing toward goals - comment (mentation limiting participation )  Acute Rehab OT Goals Patient Stated Goal: to get stronger OT Goal Formulation: With patient/family Time For Goal Achievement: 09/24/19 Potential to Achieve Goals:  Good ADL Goals Pt Will Perform Lower Body Dressing: with min assist;sit to/from stand Pt Will Transfer to Toilet: with  supervision;stand pivot transfer;ambulating Pt Will Perform Toileting - Clothing Manipulation and hygiene: with min guard assist;sit to/from stand  Plan Discharge plan remains appropriate;Frequency remains appropriate    Co-evaluation                 AM-PAC OT "6 Clicks" Daily Activity     Outcome Measure   Help from another person eating meals?: A Little Help from another person taking care of personal grooming?: A Little Help from another person toileting, which includes using toliet, bedpan, or urinal?: A Lot Help from another person bathing (including washing, rinsing, drying)?: A Lot Help from another person to put on and taking off regular upper body clothing?: A Lot Help from another person to put on and taking off regular lower body clothing?: A Lot 6 Click Score: 14    End of Session    OT Visit Diagnosis: Unsteadiness on feet (R26.81);Muscle weakness (generalized) (M62.81);History of falling (Z91.81);Pain Pain - Right/Left: Right Pain - part of body: Hip   Activity Tolerance Patient tolerated treatment well   Patient Left in bed;with call bell/phone within reach;with bed alarm set;with family/visitor present   Nurse Communication Other (comment) (medication in room )        Time: 3235-5732 OT Time Calculation (min): 30 min  Charges: OT General Charges $OT Visit: 1 Visit OT Treatments $Self Care/Home Management : 23-37 mins  Kathie Dike, M.S. OTR/L  09/11/19, 10:51 AM  ascom [phone]

## 2019-09-12 LAB — CBC
HCT: 35.9 % — ABNORMAL LOW (ref 36.0–46.0)
Hemoglobin: 11.7 g/dL — ABNORMAL LOW (ref 12.0–15.0)
MCH: 30.7 pg (ref 26.0–34.0)
MCHC: 32.6 g/dL (ref 30.0–36.0)
MCV: 94.2 fL (ref 80.0–100.0)
Platelets: 129 10*3/uL — ABNORMAL LOW (ref 150–400)
RBC: 3.81 MIL/uL — ABNORMAL LOW (ref 3.87–5.11)
RDW: 13.8 % (ref 11.5–15.5)
WBC: 7 10*3/uL (ref 4.0–10.5)
nRBC: 0 % (ref 0.0–0.2)

## 2019-09-12 NOTE — Progress Notes (Signed)
Physical Therapy Treatment Patient Details Name: Dana Love MRN: [id number] DOB: Aug 20, 1932 Today's Date: 09/12/2019    History of Present Illness Pt is an 86 y.o. female presenting to hospital 8/17 s/p fall d/t tripping and reporting R sided hip pain.  Pt s/p 8/18 cannulated screw fixation for R femoral neck hip fx.  PMH includes Alzheimer's disease.    PT Comments    Pt was seated in recliner upon arriving. She is alert and pleasantly confused. Oriented to self only. Was able to state she fell but unable to recall proper wt bearing restrictions or injuries sustained. She was able to STS from recliner 3 x with mod-max assist + max vcs ans assistance to adhere to proper wt bearing. Able to static stand ~ 1 minutes each trial. After transfer trials, pt performed there ex in chair and tolerated well. She will benefit form continued skilled PT at SNF to address deficits and improve safe functional mobility. She was sitting in recliner at conclusion of session with call bell in reach and RN aware of pt's abilities and precautions.     Follow Up Recommendations  SNF     Equipment Recommendations  Other (comment) (defer to next level of care)    Recommendations for Other Services       Precautions / Restrictions Precautions Precautions: Fall Restrictions Weight Bearing Restrictions: Yes RLE Weight Bearing: Partial weight bearing RLE Partial Weight Bearing Percentage or Pounds: 25%    Mobility  Bed Mobility               General bed mobility comments: pt was in recliner pre/post session  Transfers Overall transfer level: Needs assistance Equipment used: Standard walker Transfers: Sit to/from Stand Sit to Stand: Mod assist;Max assist         General transfer comment: pt did not want to return to bed. Did perform STS 3 x from recliner with mod-max assist + constant assist/cues for proper wt bearing. after transfers pt performed ther ex in recliner  Ambulation/Gait              General Gait Details: unable to safely progress 2/2 to wt bearing restrictions and pt's inability to properly follow   Stairs             Wheelchair Mobility    Modified Rankin (Stroke Patients Only)       Balance Overall balance assessment: Needs assistance Sitting-balance support: Feet supported Sitting balance-Leahy Scale: Fair     Standing balance support: Bilateral upper extremity supported Standing balance-Leahy Scale: Poor Standing balance comment: requires constant assist to balance and maintaining proper wt bearing. high fall risk.                            Cognition Arousal/Alertness: Awake/alert Behavior During Therapy: WFL for tasks assessed/performed Overall Cognitive Status: Impaired/Different from baseline Area of Impairment: Orientation;Attention;Memory;Following commands;Safety/judgement;Awareness                 Orientation Level: Disoriented to;Place;Time;Situation   Memory: Decreased recall of precautions;Decreased short-term memory Following Commands: Follows one step commands inconsistently Safety/Judgement: Decreased awareness of safety;Decreased awareness of deficits     General Comments: Pt is alert and pleasantly confused. Is able to follow commands but unable to recall proper wt bearing.      Exercises Total Joint Exercises Ankle Circles/Pumps: AROM;Strengthening;Both;10 reps;Supine Quad Sets: AROM;Strengthening;Both;10 reps;Supine Short Arc Quad: AAROM;Right;AROM;Left;Strengthening;10 reps;Supine;Both Heel Slides: AAROM;Right;AROM;Left;Strengthening;10 reps;Supine;Both Hip ABduction/ADduction: AAROM;Right;AROM;Left;Strengthening;10 reps;Supine;Both  General Comments        Pertinent Vitals/Pain Pain Assessment: No/denies pain Pain Score: 0-No pain (reports discomfort in standing only) Pain Location: R hip/thigh Pain Descriptors / Indicators: Discomfort;Tender;Sore;Aching Pain  Intervention(s): Limited activity within patient's tolerance;Premedicated before session;Monitored during session;Repositioned;Ice applied    Home Living                      Prior Function            PT Goals (current goals can now be found in the care plan section) Acute Rehab PT Goals Patient Stated Goal: to get stronger Progress towards PT goals: Progressing toward goals    Frequency    BID      PT Plan Current plan remains appropriate    Co-evaluation              AM-PAC PT "6 Clicks" Mobility   Outcome Measure  Help needed turning from your back to your side while in a flat bed without using bedrails?: A Little Help needed moving from lying on your back to sitting on the side of a flat bed without using bedrails?: A Lot Help needed moving to and from a bed to a chair (including a wheelchair)?: A Lot Help needed standing up from a chair using your arms (e.g., wheelchair or bedside chair)?: A Lot Help needed to walk in hospital room?: Total Help needed climbing 3-5 steps with a railing? : Total 6 Click Score: 11    End of Session Equipment Utilized During Treatment: Gait belt Activity Tolerance: Patient tolerated treatment well Patient left: in chair;with call bell/phone within reach;with chair alarm set;with SCD's reapplied Nurse Communication: Mobility status;Precautions;Weight bearing status PT Visit Diagnosis: Other abnormalities of gait and mobility (R26.89);Muscle weakness (generalized) (M62.81);History of falling (Z91.81);Difficulty in walking, not elsewhere classified (R26.2);Pain Pain - Right/Left: Right Pain - part of body: Hip     Time: 9563-8756 PT Time Calculation (min) (ACUTE ONLY): 19 min  Charges:  $Therapeutic Activity: 8-22 mins                     Jetta Lout PTA 09/12/19, 1:44 PM

## 2019-09-12 NOTE — Progress Notes (Addendum)
Progress Note    Dana Love  ZOX:[id number] DOB: 1932-05-23  DOA: 09/08/2019 PCP: Barbette Reichmann, MD      Brief Narrative:    Medical records reviewed and are as summarized below:  Dana Love is a 86 y.o. female  with history of Alzheimer dementia, dyslipidemia who present to the hospital after a fall.  X-ray showed a right hip fracture      Assessment/Plan:   Active Problems:   Closed right hip fracture (HCC)   Right hip fracture Intermittent episodes of elevated blood pressure Delirium/hallucinations Alzheimer's dementia Dyslipidemia   PLAN  S/p cannulated screw fixation for right femoral neck hip fracture on 09/09/2019 Analgesics as needed for pain. Monitor BP closely Continue low-dose Haldol for today.  I am Haldol as needed for agitation. Continue Lipitor, sertraline and Aricept. Awaiting placement to SNF. Plan discussed with her son at the bedside.    Body mass index is 20.79 kg/m.  Diet Order            Diet regular Room service appropriate? Yes; Fluid consistency: Thin  Diet effective now                   Medications:   . ascorbic acid  500 mg Oral BID  . atorvastatin  10 mg Oral Daily  . Chlorhexidine Gluconate Cloth  6 each Topical Daily  . cholecalciferol  2,000 Units Oral Daily  . docusate sodium  100 mg Oral BID  . donepezil  10 mg Oral QHS  . enoxaparin (LOVENOX) injection  40 mg Subcutaneous Q24H  . haloperidol  0.5 mg Oral BID  . mupirocin ointment  1 application Nasal BID  . raloxifene  60 mg Oral Daily  . senna  1 tablet Oral BID  . sertraline  25 mg Oral Daily  . vitamin B-12  500 mcg Oral Daily   Continuous Infusions: . methocarbamol (ROBAXIN) IV       Anti-infectives (From admission, onward)   Start     Dose/Rate Route Frequency Ordered Stop   09/09/19 2000  ceFAZolin (ANCEF) IVPB 1 g/50 mL premix        1 g 100 mL/hr over 30 Minutes Intravenous Every 6 hours 09/09/19 1656 09/10/19 2207    09/09/19 0903  ceFAZolin (ANCEF) IVPB 2g/100 mL premix        2 g 200 mL/hr over 30 Minutes Intravenous 30 min pre-op 09/09/19 8119 09/09/19 1346             Family Communication/Anticipated D/C date and plan/Code Status   DVT prophylaxis: enoxaparin (LOVENOX) injection 40 mg Start: 09/10/19 0800 SCDs Start: 09/09/19 1656 Place TED hose Start: 09/09/19 1656     Code Status: Full Code  Family Communication: Plan discussed with her son at bedside Disposition Plan:    Status is: Inpatient  Remains inpatient appropriate because:Inpatient level of care appropriate due to severity of illness   Dispo: The patient is from: Home              Anticipated d/c is to: SNF              Anticipated d/c date is: 1 day              Patient currently is not medically stable to d/c.           Subjective:    Patient has been intermittently confused according to the husband who is at the bedside.  Objective:  Vitals:   09/11/19 0015 09/11/19 0748 09/11/19 2340 09/12/19 0903  BP: (!) 149/70 132/78 (!) 158/78 (!) 143/68  Pulse: 81 84 74 75  Resp: 18 17 17 17   Temp: 98 F (36.7 C) 98.7 F (37.1 C) 98.6 F (37 C) 98.1 F (36.7 C)  TempSrc: Oral Oral Oral Oral  SpO2: 91% 91% 96% 93%  Weight:      Height:       No data found.   Intake/Output Summary (Last 24 hours) at 09/12/2019 1412 Last data filed at 09/12/2019 0500 Gross per 24 hour  Intake --  Output 1400 ml  Net -1400 ml   Filed Weights   09/08/19 1540  Weight: 49.9 kg    Exam:  GEN: No acute distress SKIN: Warm and dry EYES: Anicteric ENT: MMM CV: RRR PULM: No wheezing or rales heard ABD: Soft and nontender CNS: AAO x 1 (person), confused, no focal deficits EXT: There is mild swelling of the right hip.  Right hip surgical wound is intact   Data Reviewed:   I have personally reviewed following labs and imaging studies:  Labs: Labs show the following:   Basic Metabolic Panel: Recent  Labs  Lab 09/08/19 1734 09/08/19 1734 09/09/19 0522 09/10/19 0443  NA 137  --  136 137  K 3.6   < > 3.6 3.6  CL 106  --  102 103  CO2 25  --  24 26  GLUCOSE 100*  --  96 116*  BUN 16  --  16 15  CREATININE 0.70  --  0.77 0.80  CALCIUM 7.5*  --  8.5* 7.9*   < > = values in this interval not displayed.   GFR Estimated Creatinine Clearance: 38.8 mL/min (by C-G formula based on SCr of 0.8 mg/dL). Liver Function Tests: Recent Labs  Lab 09/08/19 1734  AST 21  ALT 15  ALKPHOS 37*  BILITOT 0.6  PROT 5.8*  ALBUMIN 3.3*   No results for input(s): LIPASE, AMYLASE in the last 168 hours. No results for input(s): AMMONIA in the last 168 hours. Coagulation profile Recent Labs  Lab 09/09/19 0925  INR 1.0    CBC: Recent Labs  Lab 09/08/19 1734 09/09/19 0522 09/10/19 0443 09/11/19 0640 09/12/19 0502  WBC 8.6 9.2 10.3 8.4 7.0  NEUTROABS 6.4  --  7.2  --   --   HGB 13.0 12.3 12.1 11.4* 11.7*  HCT 38.3 37.5 35.7* 33.6* 35.9*  MCV 91.0 93.3 91.3 91.1 94.2  PLT 143* 129* 124* 120* 129*   Cardiac Enzymes: No results for input(s): CKTOTAL, CKMB, CKMBINDEX, TROPONINI in the last 168 hours. BNP (last 3 results) No results for input(s): PROBNP in the last 8760 hours. CBG: No results for input(s): GLUCAP in the last 168 hours. D-Dimer: No results for input(s): DDIMER in the last 72 hours. Hgb A1c: No results for input(s): HGBA1C in the last 72 hours. Lipid Profile: No results for input(s): CHOL, HDL, LDLCALC, TRIG, CHOLHDL, LDLDIRECT in the last 72 hours. Thyroid function studies: No results for input(s): TSH, T4TOTAL, T3FREE, THYROIDAB in the last 72 hours.  Invalid input(s): FREET3 Anemia work up: No results for input(s): VITAMINB12, FOLATE, FERRITIN, TIBC, IRON, RETICCTPCT in the last 72 hours. Sepsis Labs: Recent Labs  Lab 09/09/19 0522 09/10/19 0443 09/11/19 0640 09/12/19 0502  WBC 9.2 10.3 8.4 7.0    Microbiology Recent Results (from the past 240 hour(s))   SARS Coronavirus 2 by RT PCR (hospital order, performed in [facility]  hospital lab) Nasopharyngeal Nasopharyngeal Swab     Status: None   Collection Time: 09/08/19  5:25 PM   Specimen: Nasopharyngeal Swab  Result Value Ref Range Status   SARS Coronavirus 2 NEGATIVE NEGATIVE Final    Comment: (NOTE) SARS-CoV-2 target nucleic acids are NOT DETECTED.  The SARS-CoV-2 RNA is generally detectable in upper and lower respiratory specimens during the acute phase of infection. The lowest concentration of SARS-CoV-2 viral copies this assay can detect is 250 copies / mL. A negative result does not preclude SARS-CoV-2 infection and should not be used as the sole basis for treatment or other patient management decisions.  A negative result may occur with improper specimen collection / handling, submission of specimen other than nasopharyngeal swab, presence of viral mutation(s) within the areas targeted by this assay, and inadequate number of viral copies (<250 copies / mL). A negative result must be combined with clinical observations, patient history, and epidemiological information.  Fact Sheet for Patients:   BoilerBrush.com.cy  Fact Sheet for Healthcare Providers: https://pope.com/  This test is not yet approved or  cleared by the [place] FDA and has been authorized for detection and/or diagnosis of SARS-CoV-2 by FDA under an Emergency Use Authorization (EUA).  This EUA will remain in effect (meaning this test can be used) for the duration of the COVID-19 declaration under Section 564(b)(1) of the Act, 21 U.S.C. section 360bbb-3(b)(1), unless the authorization is terminated or revoked sooner.  Performed at [facility], [address] [place], [place] [address]   Surgical pcr screen     Status: Abnormal   Collection Time: 09/08/19  6:01 PM   Specimen: Nasal Mucosa; Nasal Swab  Result Value Ref Range Status   MRSA, PCR  POSITIVE (A) NEGATIVE Final   Staphylococcus aureus POSITIVE (A) NEGATIVE Final    Comment: RESULT CALLED TO, READ BACK BY AND VERIFIED WITH: Cammy Copa 09/08/19 AT 1958 BY ACR (NOTE) The Xpert SA Assay (FDA approved for NASAL specimens in patients 51 years of age and older), is one component of a comprehensive surveillance program. It is not intended to diagnose infection nor to guide or monitor treatment. Performed at [facility], [address] [place], [place] [address]     Procedures and diagnostic studies:  No results found.             LOS: 4 days   Zahid Carneiro  [facility]   Pager on [email or web address]. If 7PM-7AM, please contact night-coverage at [email or web address]     09/12/2019, 2:12 PM

## 2019-09-12 NOTE — Progress Notes (Signed)
Physical Therapy Treatment Patient Details Name: Dana Love MRN: [id number] DOB: 26-Oct-1932 Today's Date: 09/12/2019    History of Present Illness Pt is an 86 y.o. female presenting to hospital 8/17 s/p fall d/t tripping and reporting R sided hip pain.  Pt s/p 8/18 cannulated screw fixation for R femoral neck hip fx.  PMH includes Alzheimer's disease.    PT Comments    Therapist stopped in hallway by pt's son,  pt requesting to have BM. Therapist assisted pt on/off BSC. She was able to STS much easier than previous session but continues to require max cues and mod assist for proper wt bearing. She was able to pivot towards her L to [facility] then bed. Had small BM prior to stand pivot back to bed. Pt struggles staying awake once seated EOB. Required a lot of assistance to progress BLEs back into bed and reposition to [facility]. Overall tolerated session well but requires increased time and max vcs for safety and adhering to PWB status. At conclusion of session, pt was in bed with bed alarm set, SCDs applied, and call bell in reach. Son present at bedside. [facility]. [facility] to address deficits and improve safe functional abilities. Acute PT will continue current POC.   Follow Up Recommendations  SNF     Equipment Recommendations  Other (comment) (defer to next level of care)    Recommendations for Other Services       Precautions / Restrictions Precautions Precautions: Fall Restrictions Weight Bearing Restrictions: Yes RLE Weight Bearing: Partial weight bearing RLE Partial Weight Bearing Percentage or Pounds: 25%    Mobility  Bed Mobility Overal bed mobility: Needs Assistance Bed Mobility: Sit to Supine Rolling: Max assist     Sit to supine: Mod assist;Max assist;HOB elevated   General bed mobility comments: mod-max to safely progress BLEs into bed.   Transfers Overall transfer level: Needs  assistance Equipment used: Standard walker Transfers: Stand Pivot Transfers Sit to Stand: Min assist Stand pivot transfers: Mod assist       General transfer comment: Pt was able to stand with much less assistance however requires max vcs to decrease wt bearing RLE.  did perform stand pivot to [facility] then back to bed with mod assist + max vcs. pt does endorse pain in standing this afternoon.   Ambulation/Gait             General Gait Details: unable to safely progress 2/2 to wt bearing restrictions and pt's inability to properly follow   Stairs             Wheelchair Mobility    Modified Rankin (Stroke Patients Only)       Balance Overall balance assessment: Needs assistance Sitting-balance support: Feet supported Sitting balance-Leahy Scale: Fair     Standing balance support: Bilateral upper extremity supported Standing balance-Leahy Scale: Poor Standing balance comment: requires constant assist to balance and maintaining proper wt bearing. high fall risk.                            Cognition Arousal/Alertness: Awake/alert Behavior During Therapy: WFL for tasks assessed/performed Overall Cognitive Status: History of cognitive impairments - at baseline (per son) Area of Impairment: Orientation;Attention;Memory;Following commands;Safety/judgement;Awareness                 Orientation Level: Disoriented to;Place;Time;Situation   Memory: Decreased recall of  precautions;Decreased short-term memory Following Commands: Follows one step commands inconsistently Safety/Judgement: Decreased awareness of safety;Decreased awareness of deficits     General Comments: Pt is A but continues to be disoriented and pleasnatly confused. Therapist stopped in hall by pt's son requesting to assist pt to [facility]      Exercises Total Joint Exercises Ankle Circles/Pumps: AROM;Strengthening;Both;10 reps;Supine Quad Sets: AROM;Strengthening;Both;10 reps;Supine Short  Arc Quad: AAROM;Right;AROM;Left;Strengthening;10 reps;Supine;Both Heel Slides: AAROM;Right;AROM;Left;Strengthening;10 reps;Supine;Both Hip ABduction/ADduction: AAROM;Right;AROM;Left;Strengthening;10 reps;Supine;Both    General Comments        Pertinent Vitals/Pain Pain Assessment: No/denies pain (endorses pain in standing) Pain Score: 0-No pain (reports discomfort in standing only) Faces Pain Scale: Hurts a little bit Pain Location: R hip/thigh Pain Descriptors / Indicators: Discomfort;Tender;Sore;Aching Pain Intervention(s): Limited activity within patient's tolerance;Monitored during session;Premedicated before session;Repositioned    Home Living                      Prior Function            PT Goals (current goals can now be found in the care plan section) Acute Rehab PT Goals Patient Stated Goal: to get stronger Progress towards PT goals: Progressing toward goals    Frequency    BID      PT Plan Current plan remains appropriate    Co-evaluation              AM-PAC PT "6 Clicks" Mobility   Outcome Measure  Help needed turning from your back to your side while in a flat bed without using bedrails?: A Little Help needed moving from lying on your back to sitting on the side of a flat bed without using bedrails?: A Lot Help needed moving to and from a bed to a chair (including a wheelchair)?: A Lot Help needed standing up from a chair using your arms (e.g., wheelchair or bedside chair)?: A Lot Help needed to walk in hospital room?: Total Help needed climbing 3-5 steps with a railing? : Total 6 Click Score: 11    End of Session Equipment Utilized During Treatment: Gait belt Activity Tolerance: Patient tolerated treatment well;Patient limited by fatigue Patient left: in bed;with call bell/phone within reach;with bed alarm set;with family/visitor present;with SCD's reapplied Nurse Communication: Mobility status;Precautions;Weight bearing status PT  Visit Diagnosis: Other abnormalities of gait and mobility (R26.89);Muscle weakness (generalized) (M62.81);History of falling (Z91.81);Difficulty in walking, not elsewhere classified (R26.2);Pain Pain - Right/Left: Right Pain - part of body: Hip     Time: 0240-9735 PT Time Calculation (min) (ACUTE ONLY): 21 min  Charges:  $Therapeutic Activity: 8-22 mins                     Jetta Lout PTA 09/12/19, 2:56 PM

## 2019-09-12 NOTE — Progress Notes (Signed)
Subjective: 3 Days Post-Op Procedure(s) (LRB): CANNULATED HIP PINNING (Right)   Patient is out of bed in a chair.  She is alert and comfortable.  Does not know if she is leaving for rehab or not yet.  Has participated in PT.  I reminded her not to put any weight on the right leg if possible.  Patient reports pain as mild.  Objective:   VITALS:   Vitals:   09/11/19 2340 09/12/19 0903  BP: (!) 158/78 (!) 143/68  Pulse: 74 75  Resp: 17 17  Temp: 98.6 F (37 C) 98.1 F (36.7 C)  SpO2: 96% 93%    Neurologically intact Neurovascular intact Sensation intact distally Intact pulses distally Incision: dressing C/D/I  LABS Recent Labs    09/10/19 0443 09/11/19 0640 09/12/19 0502  HGB 12.1 11.4* 11.7*  HCT 35.7* 33.6* 35.9*  WBC 10.3 8.4 7.0  PLT 124* 120* 129*    Recent Labs    09/10/19 0443  NA 137  K 3.6  BUN 15  CREATININE 0.80  GLUCOSE 116*    No results for input(s): LABPT, INR in the last 72 hours.   Assessment/Plan: 3 Days Post-Op Procedure(s) (LRB): CANNULATED HIP PINNING (Right)   Advance diet Up with therapy Discharge to SNF   Return to clinic 2 weeks to see Dr. Martha Clan

## 2019-09-13 MED ORDER — HYDROCODONE-ACETAMINOPHEN 5-325 MG PO TABS
1.0000 | ORAL_TABLET | Freq: Three times a day (TID) | ORAL | 0 refills | Status: DC | PRN
Start: 2019-09-13 — End: 2021-09-09

## 2019-09-13 MED ORDER — ENOXAPARIN SODIUM 40 MG/0.4ML ~~LOC~~ SOLN
40.0000 mg | SUBCUTANEOUS | Status: DC
Start: 2019-09-14 — End: 2021-09-09

## 2019-09-13 MED ORDER — POLYETHYLENE GLYCOL 3350 17 G PO PACK: 17.0000 g | PACK | Freq: Every day | ORAL | 0 refills | Status: AC | PRN

## 2019-09-13 NOTE — Progress Notes (Signed)
Subjective: 4 Days Post-Op Procedure(s) (LRB): CANNULATED HIP PINNING (Right)   Patient is doing well today and is out of bed in the chair.  Arrangements have been made for her to go [facility].  I again reminded her to remain touchdown weightbearing only on the right leg.  She has little pain.  Patient reports pain as mild.  Objective:   VITALS:   Vitals:   09/12/19 2314 09/13/19 0900  BP: (!) 147/81 134/76  Pulse: 74 76  Resp: 17 18  Temp: 98.8 F (37.1 C) 98.5 F (36.9 C)  SpO2: 93% 96%    Neurologically intact Neurovascular intact Sensation intact distally Intact pulses distally Dorsiflexion/Plantar flexion intact Incision: dressing C/D/I  LABS Recent Labs    09/11/19 0640 09/12/19 0502  HGB 11.4* 11.7*  HCT 33.6* 35.9*  WBC 8.4 7.0  PLT 120* 129*    No results for input(s): NA, K, BUN, CREATININE, GLUCOSE in the last 72 hours.  No results for input(s): LABPT, INR in the last 72 hours.   Assessment/Plan: 4 Days Post-Op Procedure(s) (LRB): CANNULATED HIP PINNING (Right)   Advance diet Up with therapy Discharge to SNF   Return to clinic 2 weeks to see Dr. Martha Clan.  Lovenox for anticoagulation for 2 weeks.

## 2019-09-13 NOTE — Progress Notes (Signed)
Physical Therapy Treatment Patient Details Name: Dana Love MRN: [id number] DOB: 08-25-1932 Today's Date: 09/13/2019    History of Present Illness Pt is an 86 y.o. female presenting to hospital 8/17 s/p fall d/t tripping and reporting R sided hip pain.  Pt s/p 8/18 cannulated screw fixation for R femoral neck hip fx.  PMH includes Alzheimer's disease.    PT Comments    Pt on commode with tech upon arrival.  Stood with pt for care and transition to chair with min a x 1.  After seated rest she was able to stand x 2 with standard walker for up to 2 minutes each while completing standing ex.  Overall progressing well.  Primary barrier will be 25% WB status and cognition as she is unable to maintain to progress gait at this time.   Follow Up Recommendations  SNF     Equipment Recommendations       Recommendations for Other Services       Precautions / Restrictions Precautions Precautions: Fall Restrictions Weight Bearing Restrictions: Yes RLE Weight Bearing: Partial weight bearing RLE Partial Weight Bearing Percentage or Pounds: 25%    Mobility  Bed Mobility               General bed mobility comments: on commode with tech upon arrival  Transfers Overall transfer level: Needs assistance Equipment used: Standard walker Transfers: Sit to/from Stand Sit to Stand: Min assist Stand pivot transfers: Min assist          Ambulation/Gait   Gait Distance (Feet): 0 Feet         General Gait Details: unable to safely progress 2/2 to wt bearing restrictions and pt's inability to properly follow   Stairs             Wheelchair Mobility    Modified Rankin (Stroke Patients Only)       Balance Overall balance assessment: Needs assistance Sitting-balance support: Feet supported Sitting balance-Leahy Scale: Fair     Standing balance support: Bilateral upper extremity supported Standing balance-Leahy Scale: Poor Standing balance comment: requires  constant assist to balance and maintaining proper wt bearing. high fall risk.                            Cognition Arousal/Alertness: Awake/alert Behavior During Therapy: WFL for tasks assessed/performed Overall Cognitive Status: History of cognitive impairments - at baseline                                        Exercises Other Exercises Other Exercises: standing 2 x 10 for RLE SLR, marches and ab/add    General Comments        Pertinent Vitals/Pain Pain Assessment: Faces Faces Pain Scale: Hurts a little bit Pain Location: R hip/thigh Pain Descriptors / Indicators: Discomfort;Tender;Sore;Aching Pain Intervention(s): Limited activity within patient's tolerance;Monitored during session;Repositioned    Home Living                      Prior Function            PT Goals (current goals can now be found in the care plan section) Progress towards PT goals: Progressing toward goals    Frequency    BID      PT Plan Current plan remains appropriate    Co-evaluation  AM-PAC PT "6 Clicks" Mobility   Outcome Measure  Help needed turning from your back to your side while in a flat bed without using bedrails?: A Little Help needed moving from lying on your back to sitting on the side of a flat bed without using bedrails?: A Lot Help needed moving to and from a bed to a chair (including a wheelchair)?: A Little Help needed standing up from a chair using your arms (e.g., wheelchair or bedside chair)?: A Little Help needed to walk in hospital room?: Total Help needed climbing 3-5 steps with a railing? : Total 6 Click Score: 13    End of Session Equipment Utilized During Treatment: Gait belt Activity Tolerance: Patient tolerated treatment well Patient left: in chair;with call bell/phone within reach;with chair alarm set;with family/visitor present Nurse Communication: Mobility status;Precautions;Weight bearing  status Pain - Right/Left: Right Pain - part of body: Hip     Time: 4982-6415 PT Time Calculation (min) (ACUTE ONLY): 17 min  Charges:  $Therapeutic Exercise: 8-22 mins                    Danielle Dess, PTA 09/13/19, 9:50 AM

## 2019-09-13 NOTE — TOC Transition Note (Signed)
Transition of Care [facility]) - CM/SW Discharge Note   Patient Details  Name: Dana Love MRN: [id number] Date of Birth: Jul 08, 1932  Transition of Care [facility]) CM/SW Contact:  Maud Deed, LCSW Phone Number: 09/13/2019, 10:06 AM   Clinical Narrative:    CSW spoke with Thayer Ohm at [facility] and he notified CSW that they do have a bed available today. Once MD clears pt for discharge, pt will be going to room 704 and will be transported via EMS, call to report number is [phone].    Final next level of care: Skilled Nursing Facility Barriers to Discharge: Other (comment) (Awaiting MD, to respond if pt medically clear)   Patient Goals and CMS Choice        Discharge Placement              Patient chooses bed at: [facility] Patient to be transferred to facility by: EMS Name of family member notified: [name] Patient and family notified of of transfer: 09/13/19  Discharge Plan and Services                                     Social Determinants of Health (SDOH) Interventions     Readmission Risk Interventions No flowsheet data found.

## 2019-09-13 NOTE — Progress Notes (Signed)
Report called to [place] at [facility].

## 2019-09-13 NOTE — Plan of Care (Signed)

## 2019-09-13 NOTE — Discharge Summary (Signed)
Physician Discharge Summary  Dana Love ZOX:[id number] DOB: 1932-06-13 DOA: 09/08/2019  PCP: Barbette ReichmannHande, Vishwanath, MD  Admit date: 09/08/2019 Discharge date: 09/13/2019  Discharge disposition: Skilled nursing facility   Recommendations for Outpatient Follow-Up:   Follow-up with Dr. Martha ClanKrasinski, orthopedic surgeon, in 2 weeks   Discharge Diagnosis:   Active Problems:   Closed right hip fracture [facility](HCC)    Discharge Condition: Stable.  Diet recommendation:  Diet Order            Diet - low sodium heart healthy           Diet regular Room service appropriate? Yes; Fluid consistency: Thin  Diet effective now                   Code Status: Full Code     Hospital Course:   Dana Love is a 86 y.o. female with history of Alzheimer dementia, dyslipidemia who present to the hospital after a fall. X-ray showed a right hip fracture She was treated with analgesics.  She was seen in consultation by orthopedic surgeon.  She had surgery (cannulated screw fixation) on the right hip on 09/09/2019.  She was evaluated by PT and OT recommended further rehabilitation at a skilled nursing facility.  Her condition has improved and she is deemed stable for discharge to SNF today.      Discharge Exam:    Vitals:   09/12/19 1533 09/12/19 1533 09/12/19 2314 09/13/19 0900  BP: (!) 147/86 (!) 147/86 (!) 147/81 134/76  Pulse: 77 84 74 76  Resp: 16 16 17 18   Temp: 98.7 F (37.1 C) 98.7 F (37.1 C) 98.8 F (37.1 C) 98.5 F (36.9 C)  TempSrc: Oral Oral Oral Oral  SpO2:  96% 93% 96%  Weight:      Height:         GEN: NAD SKIN: No rash EYES: EOMI, no pallor or icterus ENT: MMM CV: RRR PULM: CTA B ABD: soft, ND, NT, +BS CNS: AAO x 1 (person), non focal EXT: Mild right hip swelling and tenderness   The results of significant diagnostics from this hospitalization (including imaging, microbiology, ancillary and laboratory) are listed below for reference.      Procedures and Diagnostic Studies:   CT Head Wo Contrast  Result Date: 09/08/2019 CLINICAL DATA:  Fall EXAM: CT HEAD WITHOUT CONTRAST TECHNIQUE: Contiguous axial images were obtained from the base of the skull through the vertex without intravenous contrast. COMPARISON:  Correlation made with MRI from 2020 FINDINGS: Brain: There is no acute intracranial hemorrhage, mass effect, or edema. Gray-white differentiation is preserved. There is no extra-axial fluid collection. Prominence of the ventricles and sulci similar to the prior MRI. There is somewhat disproportionate ventricular prominence, which may reflect central volume loss or communicating hydrocephalus in the appropriate setting. Patchy hypoattenuation in the supratentorial white matter is nonspecific but may reflect mild microvascular ischemic changes. Vascular: There is atherosclerotic calcification at the skull base. Skull: Calvarium is unremarkable. Sinuses/Orbits: No acute finding. Other: None. IMPRESSION: No evidence of acute intracranial injury. Electronically Signed   By: Guadlupe SpanishPraneil  Patel M.D.   On: 09/08/2019 16:46   DG Chest Portable 1 View  Result Date: 09/08/2019 CLINICAL DATA:  The patient suffered a trip and fall over a shoe string today. EXAM: PORTABLE CHEST 1 VIEW COMPARISON:  None. FINDINGS: Single, small calcified granulomas are seen in both the right and left chest. Lungs otherwise clear. Heart size normal. No pneumothorax or pleural effusion. No  acute or focal bony abnormality. IMPRESSION: No acute disease. Electronically Signed   By: Drusilla Kanner M.D.   On: 09/08/2019 16:24   DG Hip Port Unilat With Pelvis 1V Right  Result Date: 09/09/2019 CLINICAL DATA:  Fixation of right hip fracture. EXAM: DG HIP (WITH OR WITHOUT PELVIS) 1V PORT RIGHT; OPERATIVE RIGHT HIP WITH PELVIS COMPARISON:  Radiographs 09/08/2019 FINDINGS: There are 3 cannulated hip screws fixating the patient's high femoral neck fracture. Good position and  alignment without complicating features. IMPRESSION: Internal fixation of high femoral neck fracture. Electronically Signed   By: Rudie Meyer M.D.   On: 09/09/2019 16:12   DG HIP OPERATIVE UNILAT W OR W/O PELVIS RIGHT  Result Date: 09/09/2019 CLINICAL DATA:  Fixation of right hip fracture. EXAM: DG HIP (WITH OR WITHOUT PELVIS) 1V PORT RIGHT; OPERATIVE RIGHT HIP WITH PELVIS COMPARISON:  Radiographs 09/08/2019 FINDINGS: There are 3 cannulated hip screws fixating the patient's high femoral neck fracture. Good position and alignment without complicating features. IMPRESSION: Internal fixation of high femoral neck fracture. Electronically Signed   By: Rudie Meyer M.D.   On: 09/09/2019 16:12   DG Hip Unilat W or Wo Pelvis 2-3 Views Right  Result Date: 09/08/2019 CLINICAL DATA:  86 year old female with fall and right hip pain. EXAM: DG HIP (WITH OR WITHOUT PELVIS) 2-3V RIGHT COMPARISON:  None. FINDINGS: Mildly impacted fracture of the neck of the right femur. There is no dislocation. The bones are osteopenic. There is mild bilateral hip arthritic changes. Degenerative changes of the lower lumbar spine. The soft tissues are unremarkable. IMPRESSION: Mildly impacted fracture of the right femoral neck. Electronically Signed   By: Elgie Collard M.D.   On: 09/08/2019 16:20     Labs:   Basic Metabolic Panel: Recent Labs  Lab 09/08/19 1734 09/08/19 1734 09/09/19 0522 09/10/19 0443  NA 137  --  136 137  K 3.6   < > 3.6 3.6  CL 106  --  102 103  CO2 25  --  24 26  GLUCOSE 100*  --  96 116*  BUN 16  --  16 15  CREATININE 0.70  --  0.77 0.80  CALCIUM 7.5*  --  8.5* 7.9*   < > = values in this interval not displayed.   GFR Estimated Creatinine Clearance: 38.8 mL/min (by C-G formula based on SCr of 0.8 mg/dL). Liver Function Tests: Recent Labs  Lab 09/08/19 1734  AST 21  ALT 15  ALKPHOS 37*  BILITOT 0.6  PROT 5.8*  ALBUMIN 3.3*   No results for input(s): LIPASE, AMYLASE in the last 168  hours. No results for input(s): AMMONIA in the last 168 hours. Coagulation profile Recent Labs  Lab 09/09/19 0925  INR 1.0    CBC: Recent Labs  Lab 09/08/19 1734 09/09/19 0522 09/10/19 0443 09/11/19 0640 09/12/19 0502  WBC 8.6 9.2 10.3 8.4 7.0  NEUTROABS 6.4  --  7.2  --   --   HGB 13.0 12.3 12.1 11.4* 11.7*  HCT 38.3 37.5 35.7* 33.6* 35.9*  MCV 91.0 93.3 91.3 91.1 94.2  PLT 143* 129* 124* 120* 129*   Cardiac Enzymes: No results for input(s): CKTOTAL, CKMB, CKMBINDEX, TROPONINI in the last 168 hours. BNP: Invalid input(s): POCBNP CBG: No results for input(s): GLUCAP in the last 168 hours. D-Dimer No results for input(s): DDIMER in the last 72 hours. Hgb A1c No results for input(s): HGBA1C in the last 72 hours. Lipid Profile No results for input(s): CHOL, HDL, LDLCALC,  TRIG, CHOLHDL, LDLDIRECT in the last 72 hours. Thyroid function studies No results for input(s): TSH, T4TOTAL, T3FREE, THYROIDAB in the last 72 hours.  Invalid input(s): FREET3 Anemia work up No results for input(s): VITAMINB12, FOLATE, FERRITIN, TIBC, IRON, RETICCTPCT in the last 72 hours. Microbiology Recent Results (from the past 240 hour(s))  SARS Coronavirus 2 by RT PCR (hospital order, performed in [facility] hospital lab) Nasopharyngeal Nasopharyngeal Swab     Status: None   Collection Time: 09/08/19  5:25 PM   Specimen: Nasopharyngeal Swab  Result Value Ref Range Status   SARS Coronavirus 2 NEGATIVE NEGATIVE Final    Comment: (NOTE) SARS-CoV-2 target nucleic acids are NOT DETECTED.  The SARS-CoV-2 RNA is generally detectable in upper and lower respiratory specimens during the acute phase of infection. The lowest concentration of SARS-CoV-2 viral copies this assay can detect is 250 copies / mL. A negative result does not preclude SARS-CoV-2 infection and should not be used as the sole basis for treatment or other patient management decisions.  A negative result may occur with improper  specimen collection / handling, submission of specimen other than nasopharyngeal swab, presence of viral mutation(s) within the areas targeted by this assay, and inadequate number of viral copies (<250 copies / mL). A negative result must be combined with clinical observations, patient history, and epidemiological information.  Fact Sheet for Patients:   BoilerBrush.com.cy  Fact Sheet for Healthcare Providers: https://pope.com/  This test is not yet approved or  cleared by the [place] FDA and has been authorized for detection and/or diagnosis of SARS-CoV-2 by FDA under an Emergency Use Authorization (EUA).  This EUA will remain in effect (meaning this test can be used) for the duration of the COVID-19 declaration under Section 564(b)(1) of the Act, 21 U.S.C. section 360bbb-3(b)(1), unless the authorization is terminated or revoked sooner.  Performed at [facility], [address] [place], [place] [address]   Surgical pcr screen     Status: Abnormal   Collection Time: 09/08/19  6:01 PM   Specimen: Nasal Mucosa; Nasal Swab  Result Value Ref Range Status   MRSA, PCR POSITIVE (A) NEGATIVE Final   Staphylococcus aureus POSITIVE (A) NEGATIVE Final    Comment: RESULT CALLED TO, READ BACK BY AND VERIFIED WITH: Cammy Copa 09/08/19 AT 1958 BY ACR (NOTE) The Xpert SA Assay (FDA approved for NASAL specimens in patients 16 years of age and older), is one component of a comprehensive surveillance program. It is not intended to diagnose infection nor to guide or monitor treatment. Performed at [facility], [address], [place], [place] [address]      Discharge Instructions:   Discharge Instructions    Diet - low sodium heart healthy   Complete by: As directed    Discharge wound care:   Complete by: As directed    Follow-up with orthopedic surgeon for staple removal     Allergies as of 09/13/2019   No  Known Allergies     Medication List    TAKE these medications   ascorbic acid 500 MG tablet Commonly known as: VITAMIN C Take 500 mg by mouth 2 (two) times daily.   atorvastatin 10 MG tablet Commonly known as: LIPITOR Take 10 mg by mouth daily.   cyanocobalamin 1000 MCG tablet Take 1,000 mcg by mouth daily.   donepezil 10 MG tablet Commonly known as: ARICEPT Take 10 mg by mouth at bedtime.   enoxaparin 40 MG/0.4ML injection Commonly known as: LOVENOX Inject  0.4 mLs (40 mg total) into the skin daily for 14 days. Start taking on: September 14, 2019   HYDROcodone-acetaminophen 5-325 MG tablet Commonly known as: NORCO/VICODIN Take 1 tablet by mouth every 8 (eight) hours as needed for moderate pain (pain score 4-6).   polyethylene glycol 17 g packet Commonly known as: MIRALAX / GLYCOLAX Take 17 g by mouth daily as needed for mild constipation.   raloxifene 60 MG tablet Commonly known as: EVISTA Take 60 mg by mouth daily.   sertraline 100 MG tablet Commonly known as: ZOLOFT Take 100 mg by mouth at bedtime.   Vitamin D 50 MCG (2000 UT) tablet Take 2,000 Units by mouth daily.            Discharge Care Instructions  (From admission, onward)         Start     Ordered   09/13/19 0000  Discharge wound care:       Comments: Follow-up with orthopedic surgeon for staple removal   09/13/19 1048          Follow-up Information    Juanell Fairly, MD. Schedule an appointment as soon as possible for a visit in 2 week(s).   Specialty: Orthopedic Surgery Contact information: [address] [place] [address] [phone]                Time coordinating discharge: 33 minutes  Signed:  Lurene Shadow  [facility] 09/13/2019, 10:48 AM   Pager on [email or web address]. If 7PM-7AM, please contact night-coverage at [email or web address]

## 2020-02-28 ENCOUNTER — Other Ambulatory Visit: Payer: Self-pay

## 2020-02-28 ENCOUNTER — Emergency Department: Payer: Medicare PPO

## 2020-02-28 ENCOUNTER — Encounter: Payer: Self-pay | Admitting: Intensive Care

## 2020-02-28 ENCOUNTER — Emergency Department
Admission: EM | Admit: 2020-02-28 | Discharge: 2020-02-28 | Disposition: A | Discharge status: Home / Self Care | Payer: Medicare PPO | Attending: Emergency Medicine | Admitting: Emergency Medicine

## 2020-02-28 DIAGNOSIS — S0990XA Unspecified injury of head, initial encounter: Secondary | ICD-10-CM | POA: Diagnosis not present

## 2020-02-28 DIAGNOSIS — G309 Alzheimer's disease, unspecified: Secondary | ICD-10-CM | POA: Insufficient documentation

## 2020-02-28 DIAGNOSIS — Y92129 Unspecified place in nursing home as the place of occurrence of the external cause: Secondary | ICD-10-CM | POA: Diagnosis not present

## 2020-02-28 DIAGNOSIS — W19XXXA Unspecified fall, initial encounter: Secondary | ICD-10-CM | POA: Insufficient documentation

## 2020-02-28 DIAGNOSIS — S7001XA Contusion of right hip, initial encounter: Secondary | ICD-10-CM | POA: Diagnosis not present

## 2020-02-28 DIAGNOSIS — S79911A Unspecified injury of right hip, initial encounter: Secondary | ICD-10-CM | POA: Diagnosis present

## 2020-02-28 HISTORY — DX: Pure hypercholesterolemia, unspecified: E78.00

## 2020-02-28 IMAGING — CT CT HEAD W/O CM
4 series · 16 of 47 positions shown, 18 images · non-contrast
Comparison: [DATE]

CLINICAL DATA: Un witnessed fall, dizziness, hit left side of head

EXAM:
CT HEAD WITHOUT CONTRAST
TECHNIQUE: Contiguous axial images were obtained from the base of the skull
through the vertex without intravenous contrast.

[Series 2: head bone · axial · 0.42mm/px · z∈[-74,-46]mm · 3 of 73 slices shown]
[im 8/73  bone]
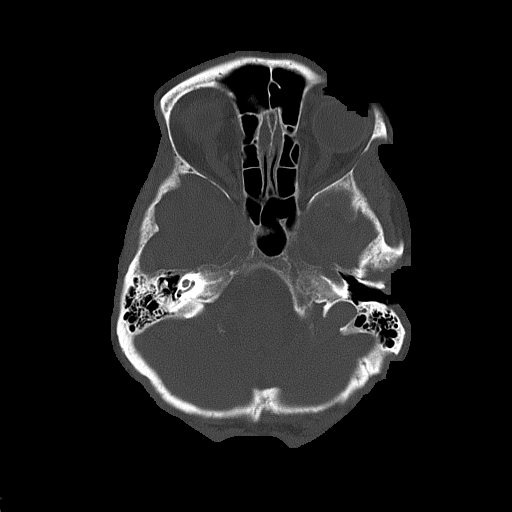
[im 15/73  bone]
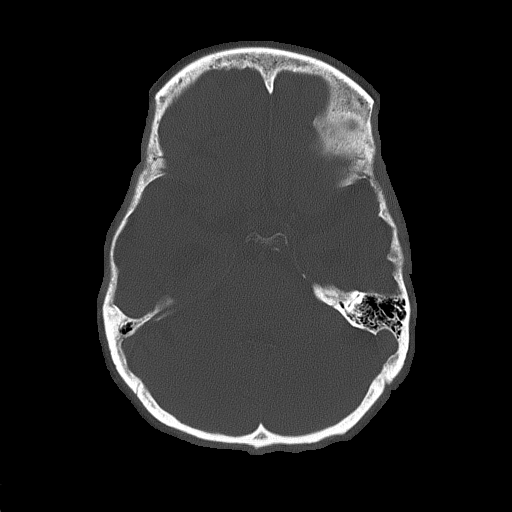
[im 22/73  bone]
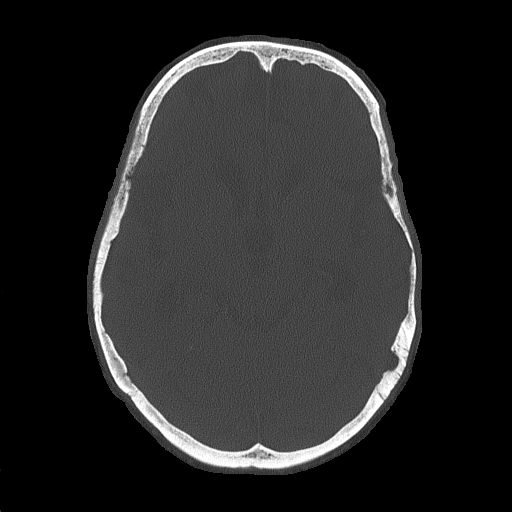

[Series 3: head wo · axial · 0.42mm/px · z∈[-73,+32]mm · 7 of 29 slices shown, 9 images]
[im 4/29  brain]
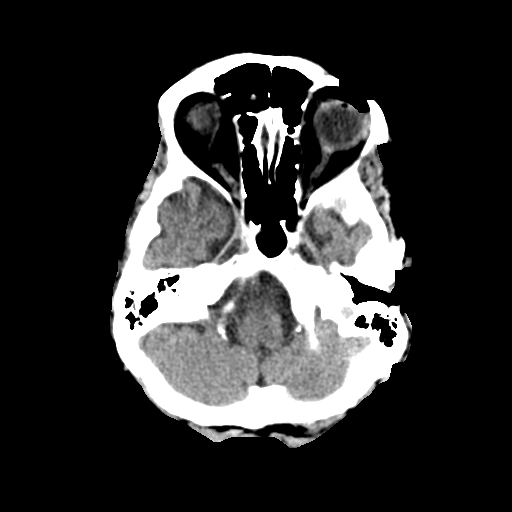
[im 4/29  bone]
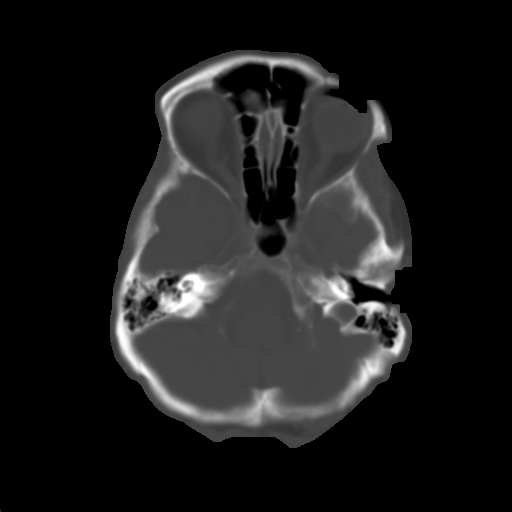
[im 8/29  brain]
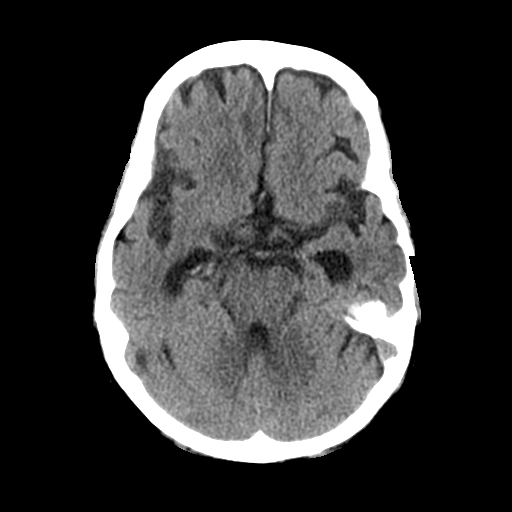
[im 11/29  brain]
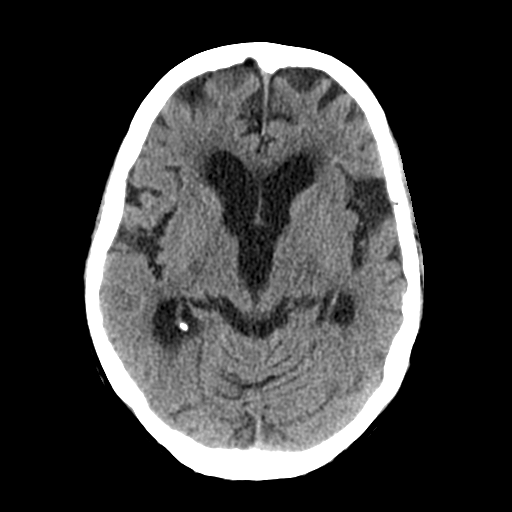
[im 15/29  brain]
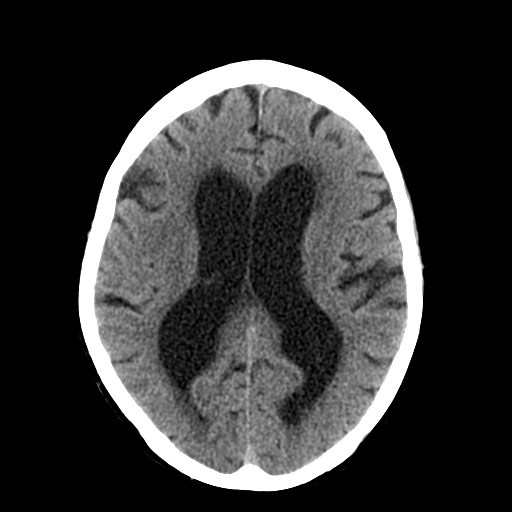
[im 18/29  brain]
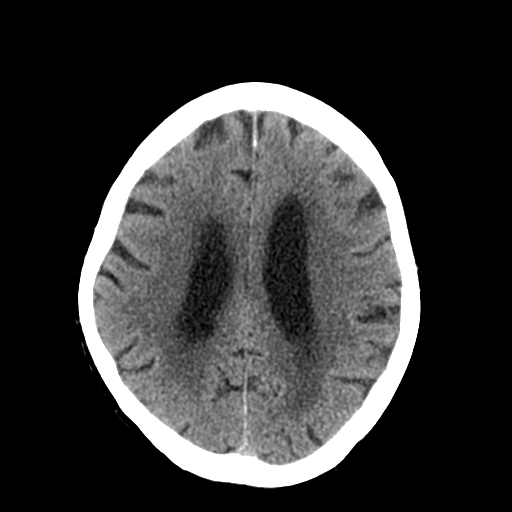
[im 18/29  bone]
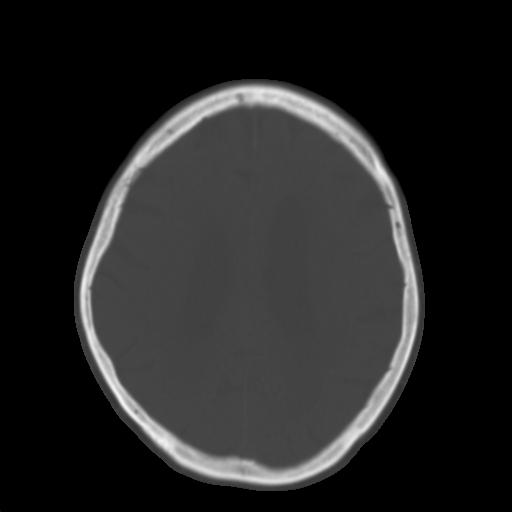
[im 22/29  brain]
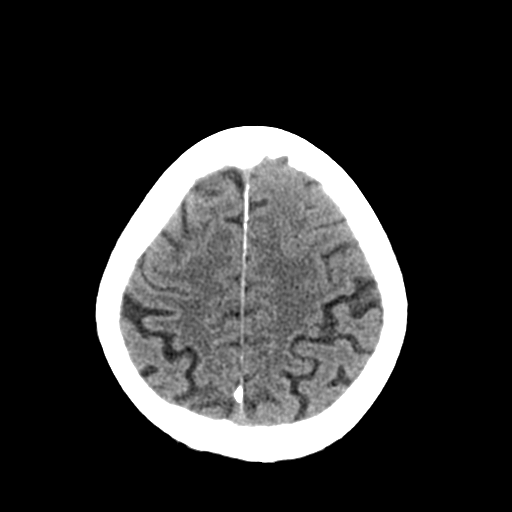
[im 25/29  brain]
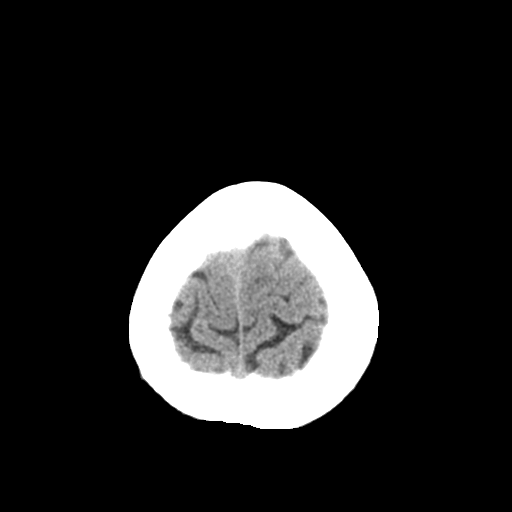

[Series 4: coronal soft tissue · coronal · 0.28mm/px · 3 of 64 slices shown]
[im 22/64  brain]
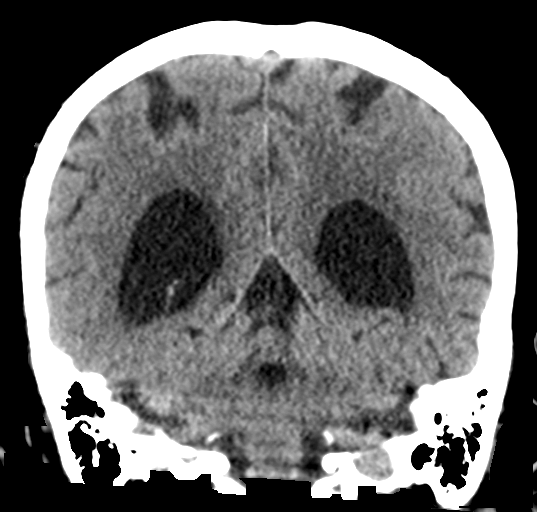
[im 29/64  brain]
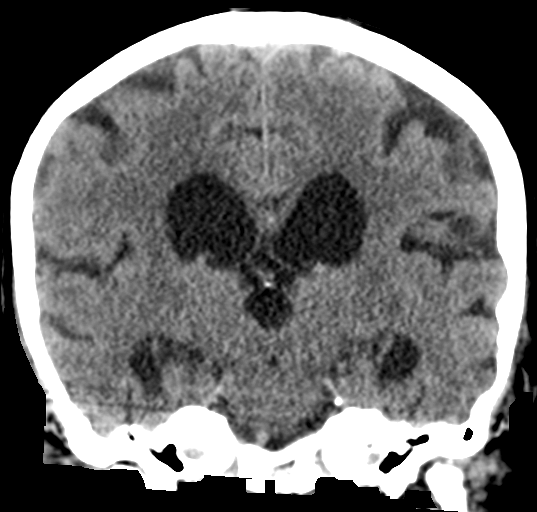
[im 36/64  brain]
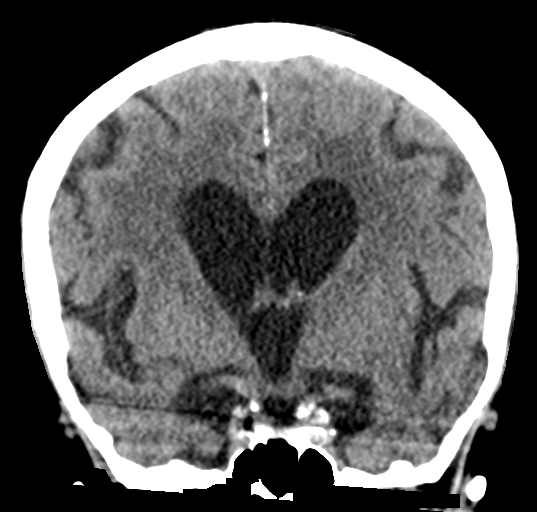

[Series 5: sagittal soft tissue · sagittal · 0.28mm/px · 3 of 51 slices shown]
[im 17/51  brain]
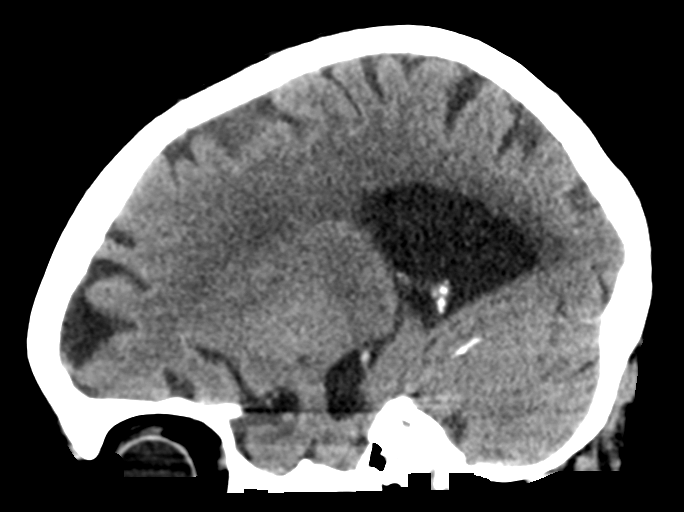
[im 26/51  brain]
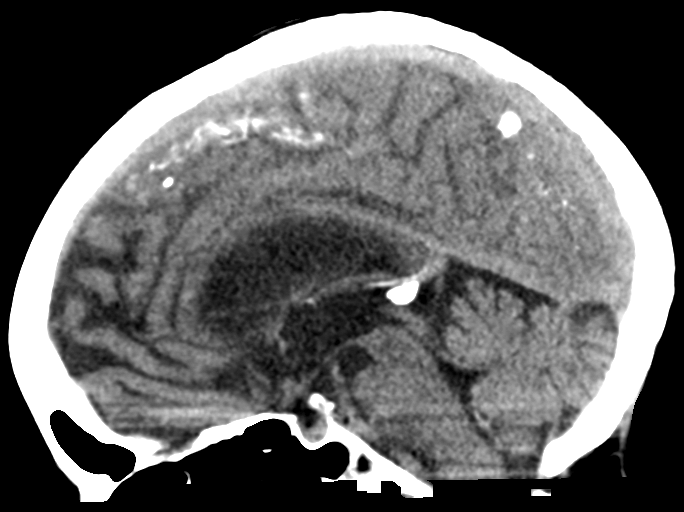
[im 34/51  brain]
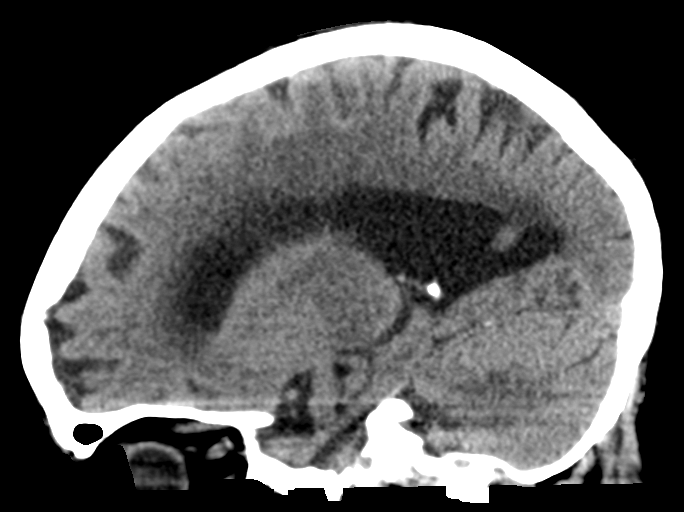

[16 of 47 positions shown; findings below may reference images not displayed]

FINDINGS: Brain: No acute infarct or hemorrhage. Lateral ventricles are
prominent, out of proportion to the degree of cerebral atrophy,
which could reflect normal pressure hydrocephalus. This is a stable
finding. Remaining midline structures are unremarkable. No acute
extra-axial fluid collections. No mass effect.

Vascular: No hyperdense vessel or unexpected calcification.

Skull: Normal. Negative for fracture or focal lesion.

Sinuses/Orbits: No acute finding.

Other: None.
IMPRESSION: 1. No acute intracranial process.  Stable exam.

## 2020-02-28 IMAGING — CR DG HIP (WITH OR WITHOUT PELVIS) 2-3V*R*
3 series · 3 of 3 positions shown · non-contrast
Comparison: [DATE]

CLINICAL DATA: Fall, injury un witnessed fall in the setting of
Alzheimer's

EXAM:
DG HIP (WITH OR WITHOUT PELVIS) 2-3V RIGHT

[pelvis ap]
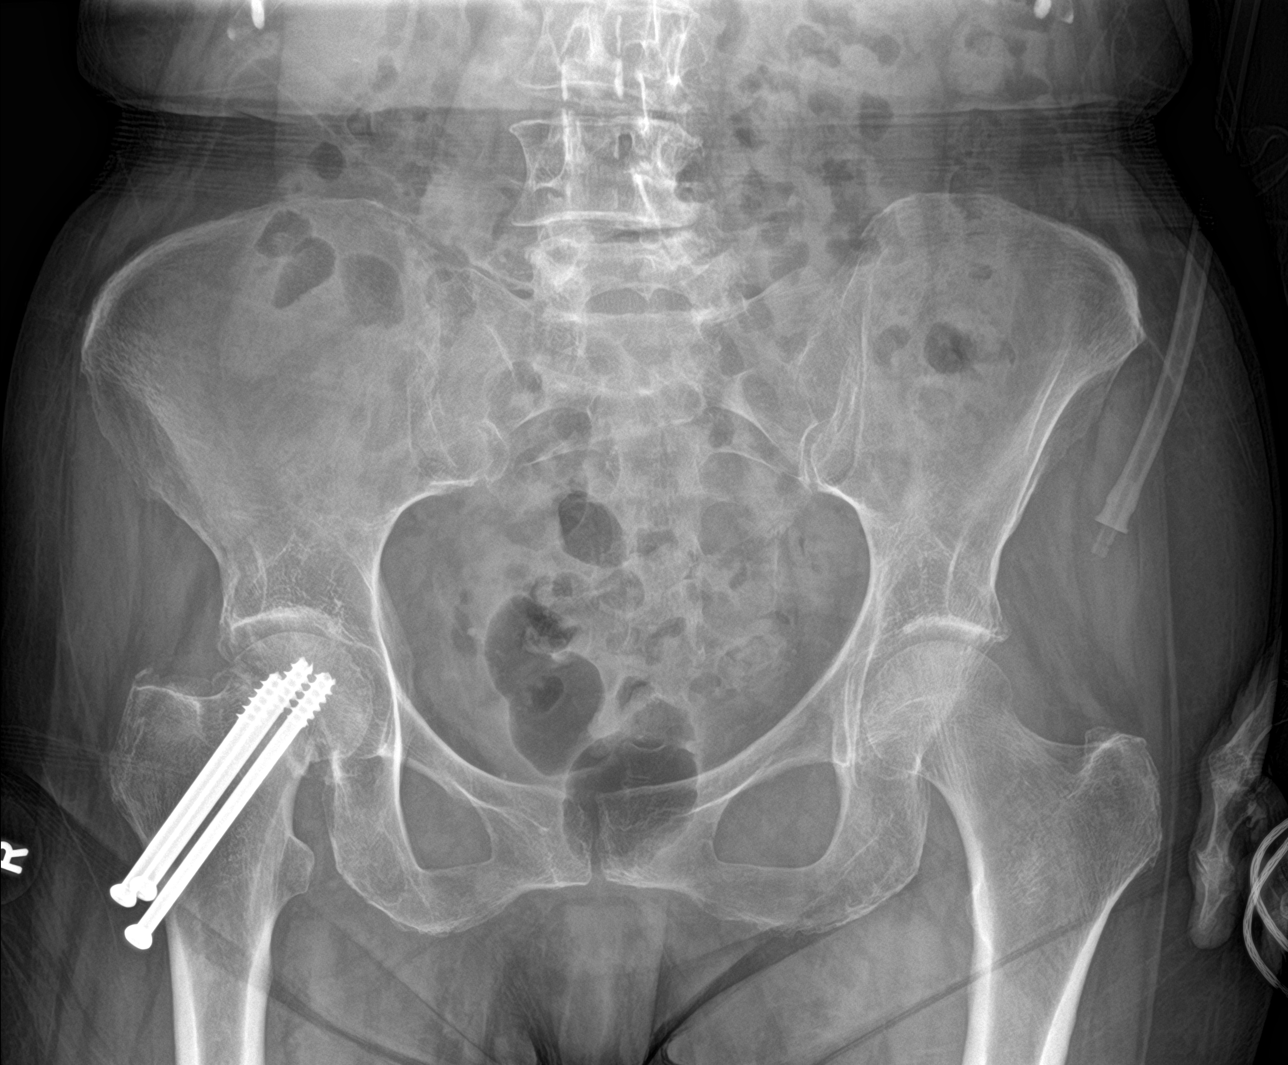

[hip ap]
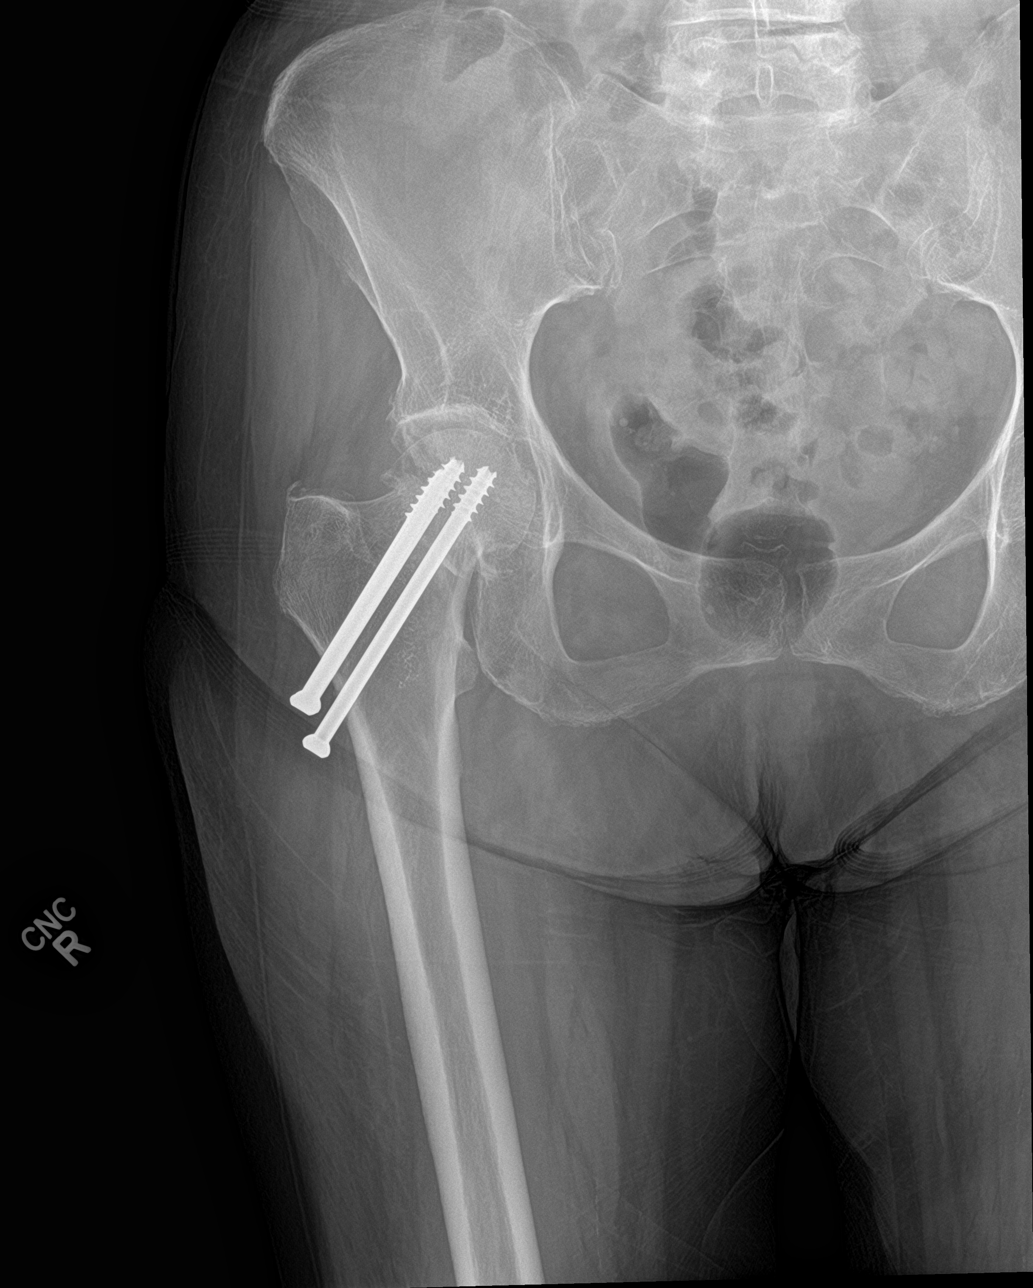

[hip lat]
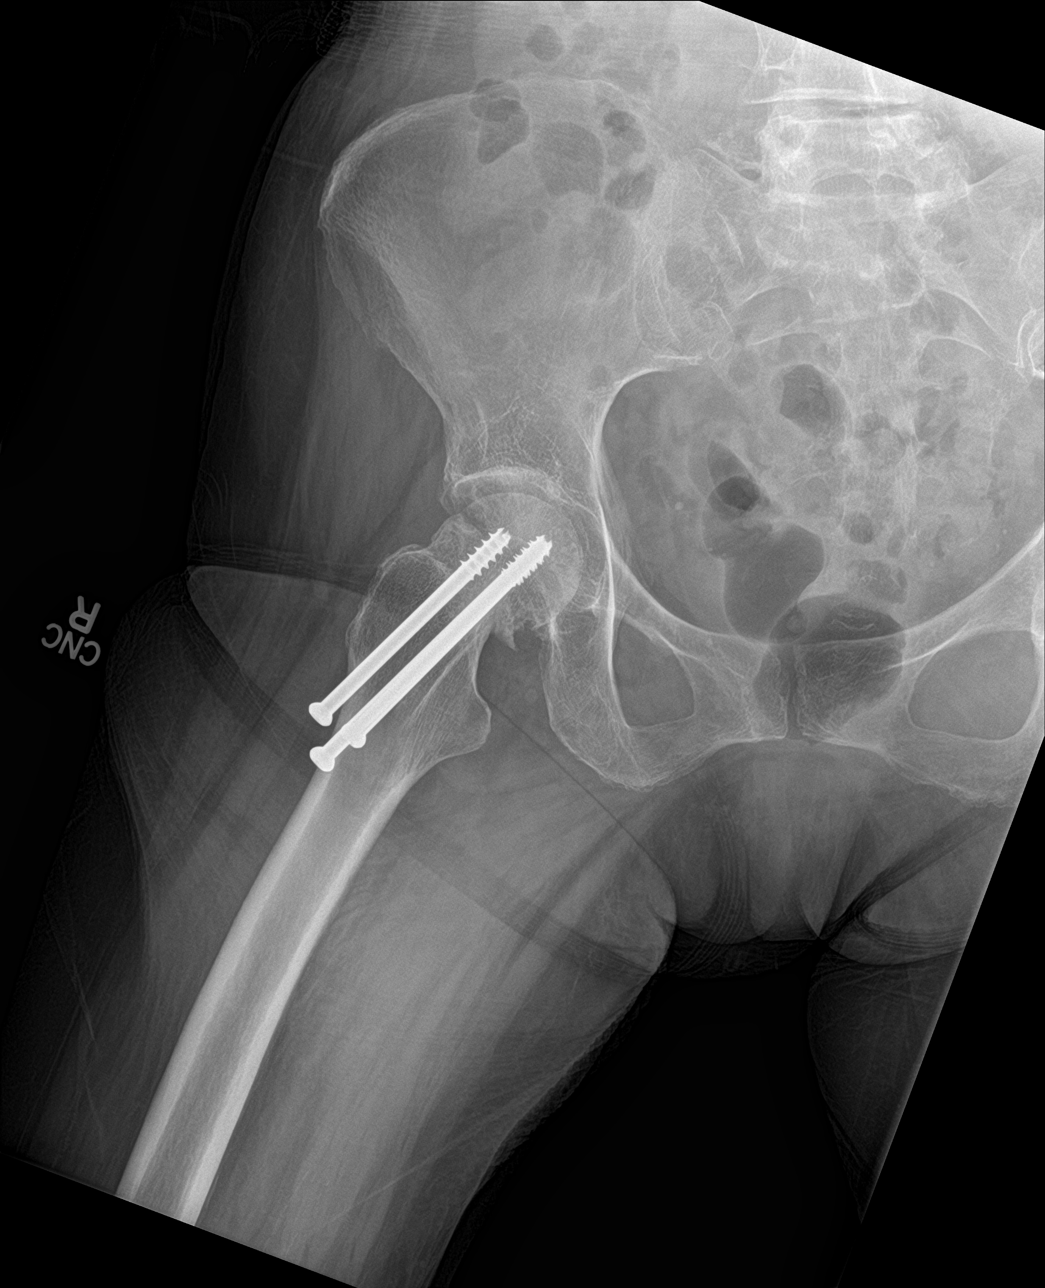

[3 of 3 positions shown; findings below may reference images not displayed]

FINDINGS: Osteopenia.

No visible pelvic fracture.

Cannulated lag screws are present in the RIGHT proximal femur
spanning femoral neck, 3 screws present as on the previous study,
perhaps more prominent along the lateral cortical margin but
appearing similarly placed in terms of depth in the femoral head,
this may be related to projectional changes.

No displaced fracture.  Signs of prior injury as before.
IMPRESSION: 1. No acute fracture or dislocation.
2. Postsurgical changes of the RIGHT proximal femur.
3. No signs of lucency about hardware. Prominence of the head of
lack type screws over the lateral femoral cortex raise the question
of slight interval retraction of hardware though this is favored to
be projectional.
4. If there is continued pain given degree of osteopenia and
confounding signs of prior injury cross-sectional imaging could be
considered for further assessment.

## 2020-02-28 IMAGING — CT CT CERVICAL SPINE W/O CM
3 of 4 series · 13 of 33 positions shown, 16 images · non-contrast
Comparison: None.

CLINICAL DATA: Un witnessed fall, hit left side of head, dizziness

EXAM:
CT CERVICAL SPINE WITHOUT CONTRAST
TECHNIQUE: Multidetector CT imaging of the cervical spine was performed without
intravenous contrast. Multiplanar CT image reconstructions were also
generated.

[Series 4: sagittal bone · sagittal · 0.23mm/px · 5 of 60 slices shown, 6 images]
[im 20/60  bone]
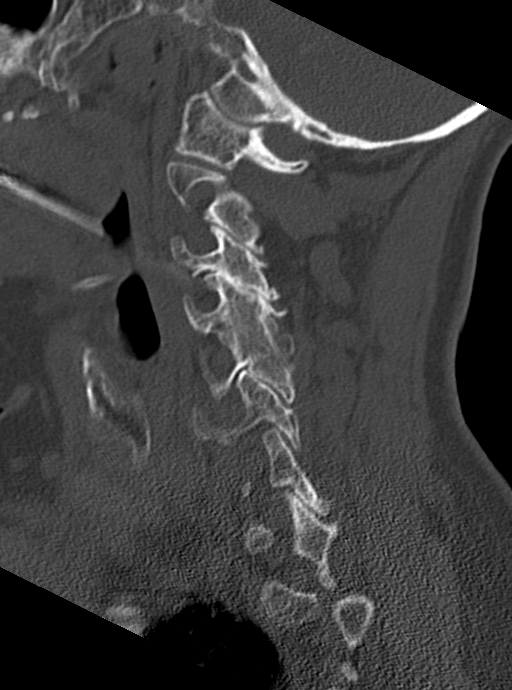
[im 25/60  bone]
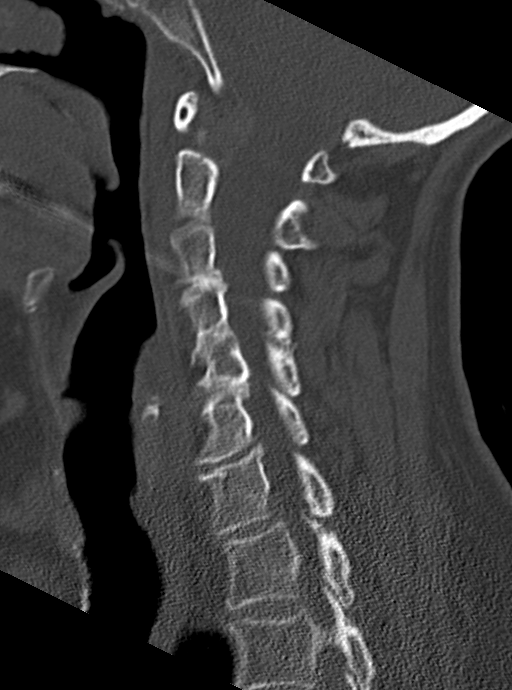
[im 30/60  soft-tissue]
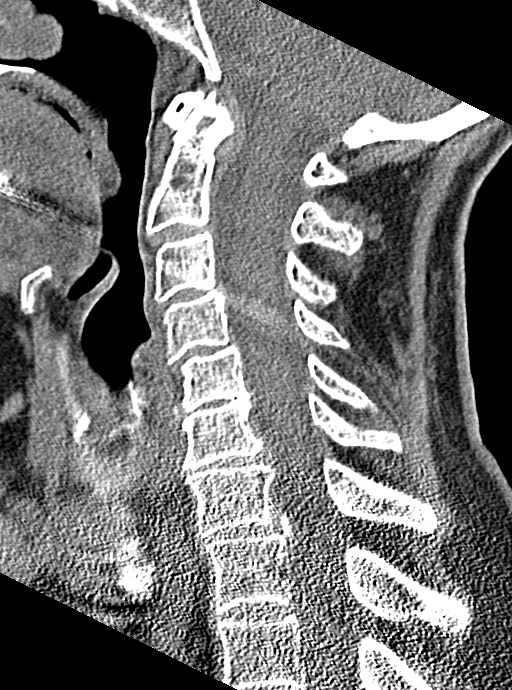
[im 30/60  bone]
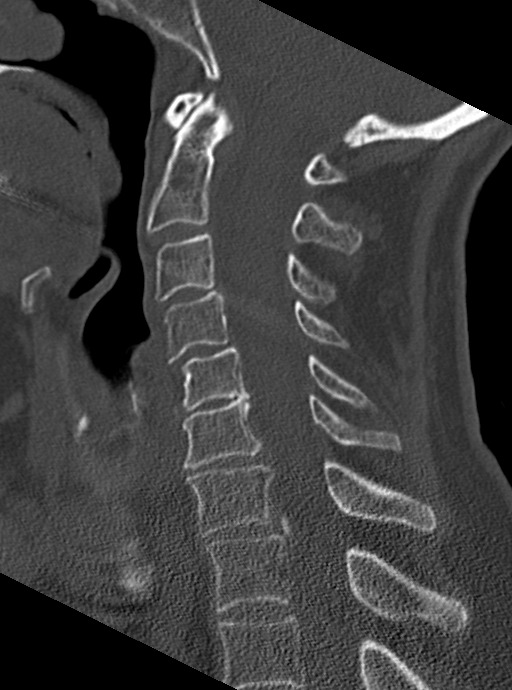
[im 35/60  bone]
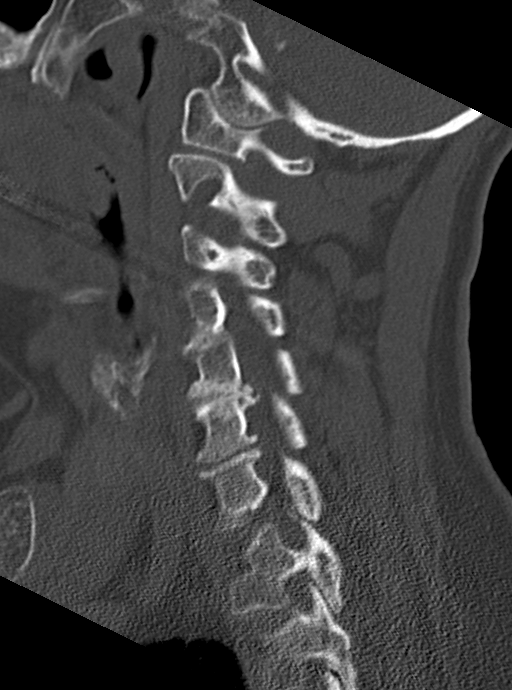
[im 40/60  bone]
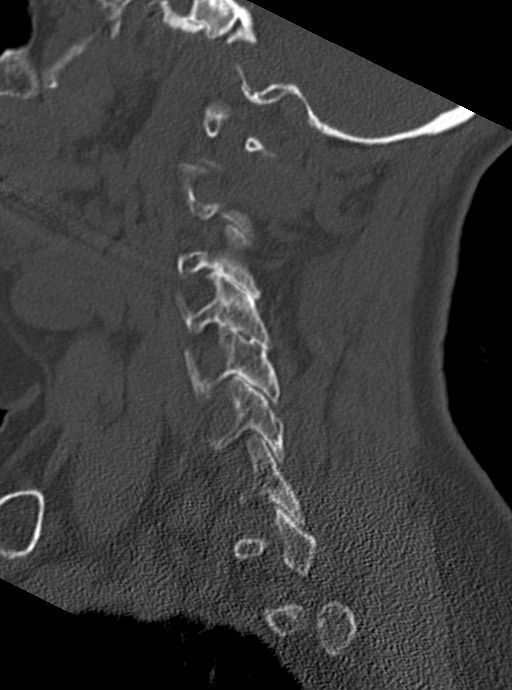

[Series 5: coronal bone · coronal · 0.23mm/px · 3 of 60 slices shown]
[im 14/60  bone]
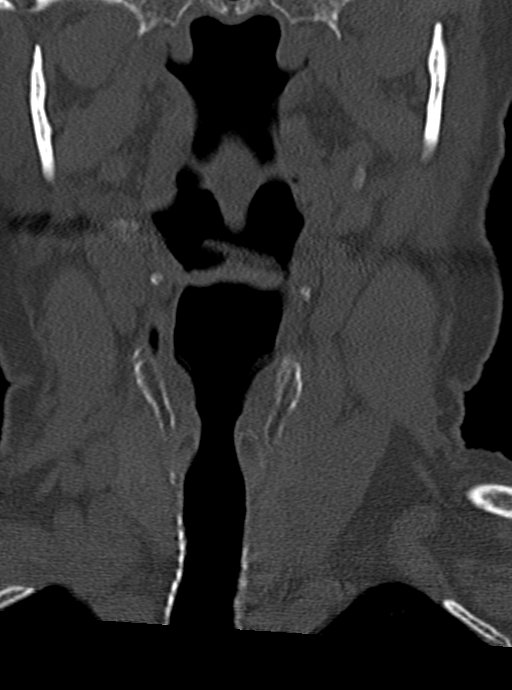
[im 25/60  bone]
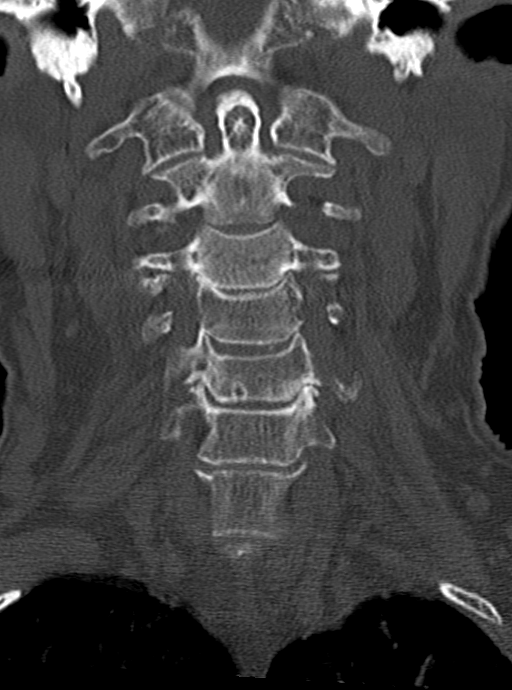
[im 36/60  bone]
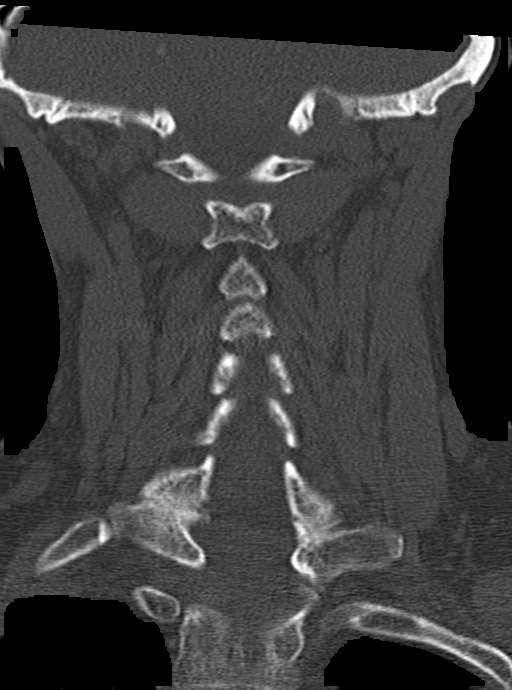

[Series 6: orthogonal bone · axial · 0.23mm/px · z∈[-232,-139]mm · 5 of 80 slices shown, 7 images]
[im 14/80  soft-tissue]
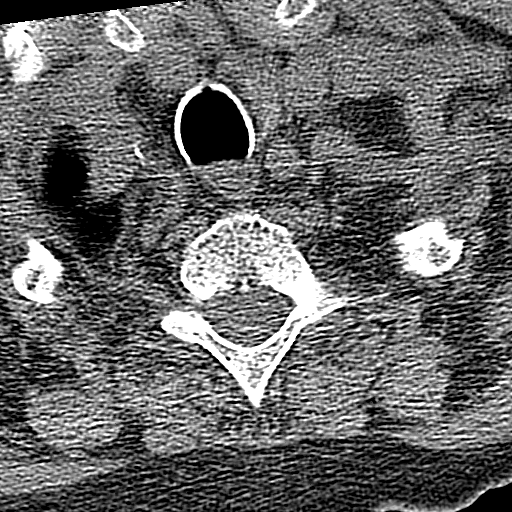
[im 14/80  bone]
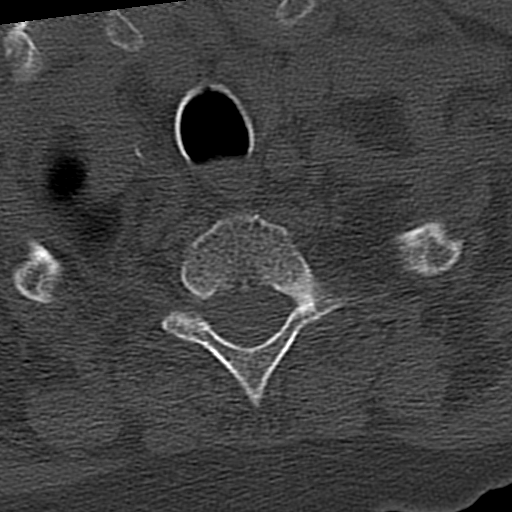
[im 27/80  bone]
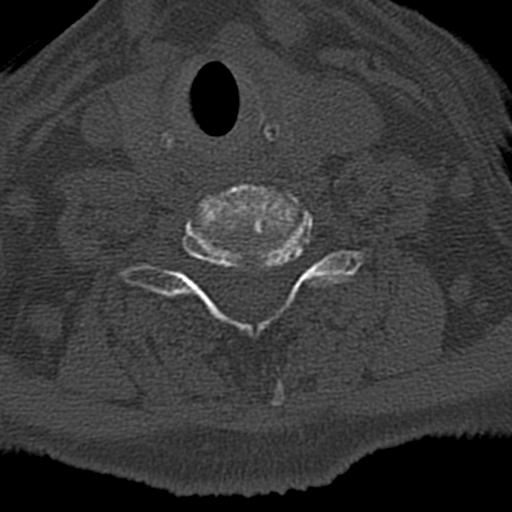
[im 40/80  bone]
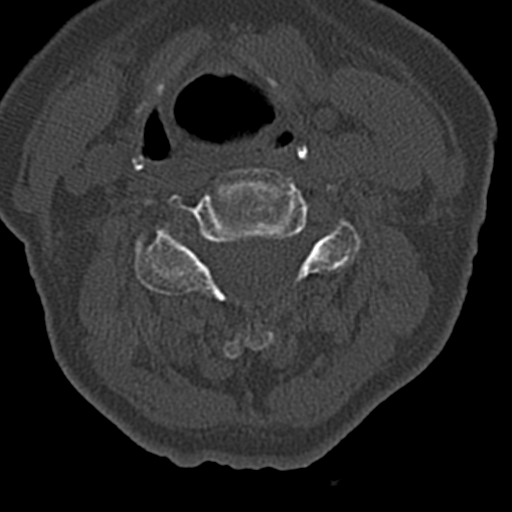
[im 53/80  bone]
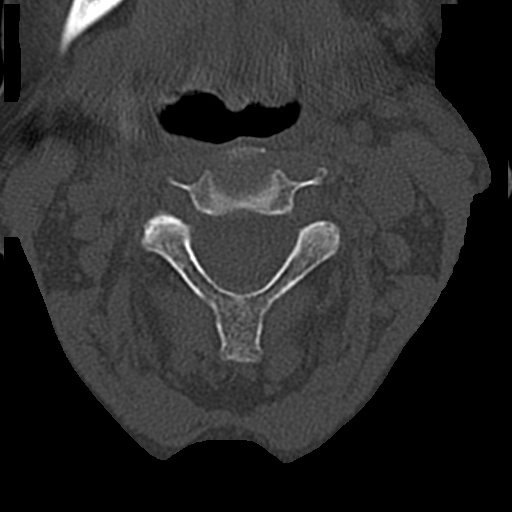
[im 66/80  soft-tissue]
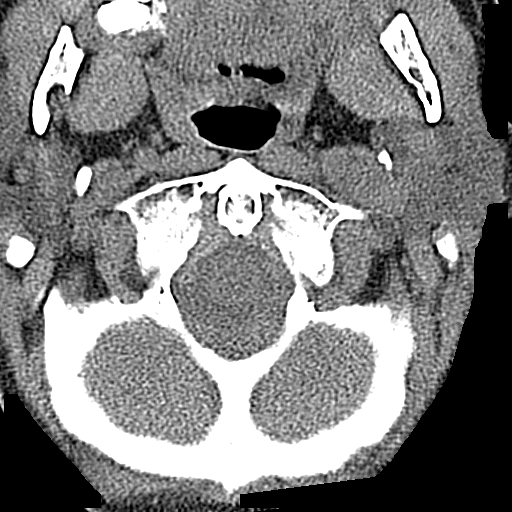
[im 66/80  bone]
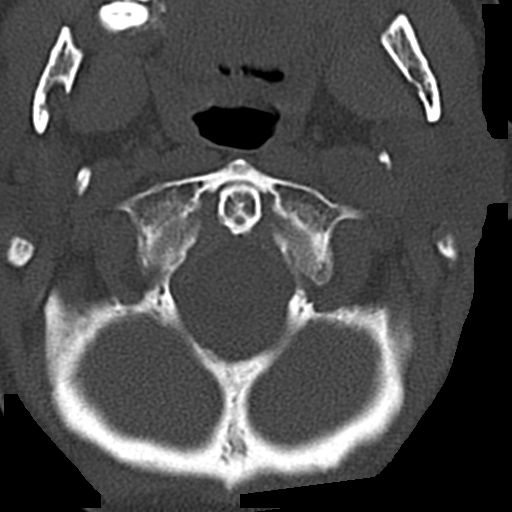

[13 of 33 positions shown; findings below may reference images not displayed]

FINDINGS: Alignment: Alignment is grossly anatomic.

Skull base and vertebrae: No acute fracture. No primary bone lesion
or focal pathologic process.

Soft tissues and spinal canal: No prevertebral fluid or swelling. No
visible canal hematoma.

Disc levels: Mild multilevel spondylosis most pronounced at C5-6 and
C6-7. Mild diffuse facet hypertrophy, greatest on the right from C3
through C5. Mild symmetrical neural foraminal narrowing from C3-4
through C5-6.

Upper chest: Airway is patent.  Lung apices are clear.

Other: Reconstructed images demonstrate no additional findings.
IMPRESSION: 1. No acute cervical spine fracture.
2. Multilevel spondylosis and facet hypertrophy, greatest from C3-4
through C5-6.

## 2020-02-28 NOTE — ED Notes (Signed)
Pt taken to CT.

## 2020-02-28 NOTE — ED Notes (Signed)
First Nurse Note: Pt to ED via [facility] from [facility] for unwitnessed fall. Pt is not on blood thinner. Pt c/o left buttock pain. Pt is in NAD.

## 2020-02-28 NOTE — ED Provider Notes (Signed)
[facility] Emergency Department Provider Note   ____________________________________________    I have reviewed the triage vital signs and the nursing notes.   HISTORY  Chief Complaint Fall     HPI Dana Love is a 87 y.o. female with a history of Alzheimer's disease limiting history.  She is here with her son.  He reports that she had an unwitnessed fall at nursing home.  Reportedly was complaining of some mild head pain although no longer.  Also complains of right hip pain but is able to stand and ambulate with walker.  No abdominal pain, no chest pain, no extremity injuries.  No nausea or vomiting  Past Medical History:  Diagnosis Date  . Alzheimer disease (HCC)   . High cholesterol     Patient Active Problem List   Diagnosis Date Noted  . Closed right hip fracture (HCC) 09/08/2019    Past Surgical History:  Procedure Laterality Date  . HIP PINNING,CANNULATED Right 09/09/2019   Procedure: CANNULATED HIP PINNING;  Surgeon: Juanell Fairly, MD;  Location: [facility] ORS;  Service: Orthopedics;  Laterality: Right;    Prior to Admission medications   Medication Sig Start Date End Date Taking? Authorizing Provider  ascorbic acid (VITAMIN C) 500 MG tablet Take 500 mg by mouth 2 (two) times daily.    [provider]  atorvastatin (LIPITOR) 10 MG tablet Take 10 mg by mouth daily.    [provider]  Cholecalciferol (VITAMIN D) 50 MCG (2000 UT) tablet Take 2,000 Units by mouth daily.    [provider]  cyanocobalamin 1000 MCG tablet Take 1,000 mcg by mouth daily.     [provider]  donepezil (ARICEPT) 10 MG tablet Take 10 mg by mouth at bedtime.    [provider]  enoxaparin (LOVENOX) 40 MG/0.4ML injection Inject 0.4 mLs (40 mg total) into the skin daily for 14 days. 09/14/19 09/28/19  Lurene Shadow, MD  HYDROcodone-acetaminophen (NORCO/VICODIN) 5-325 MG tablet Take 1 tablet by mouth every 8 (eight)  hours as needed for moderate pain (pain score 4-6). 09/13/19   Lurene Shadow, MD  polyethylene glycol (MIRALAX / GLYCOLAX) 17 g packet Take 17 g by mouth daily as needed for mild constipation. 09/13/19   Lurene Shadow, MD  raloxifene (EVISTA) 60 MG tablet Take 60 mg by mouth daily.    [provider]  sertraline (ZOLOFT) 100 MG tablet Take 100 mg by mouth at bedtime.     [provider]     Allergies Patient has no known allergies.  History reviewed. No pertinent family history.  Social History Social History   Tobacco Use  . Smoking status: Never Smoker  . Smokeless tobacco: Never Used  Substance Use Topics  . Alcohol use: Never  . Drug use: Never    Review of Systems  Constitutional: No fever/chills Eyes: No visual changes.  ENT: No sore throat. Cardiovascular: Denies chest pain. Respiratory: Denies shortness of breath. Gastrointestinal: No abdominal pain.  No nausea, no vomiting.   Genitourinary: Negative for dysuria. Musculoskeletal: Right hip Skin: Negative for rash. Neurological: Negative for focal weakness   ____________________________________________   PHYSICAL EXAM:  VITAL SIGNS: ED Triage Vitals  Enc Vitals Group     BP 02/28/20 1528 (!) 141/78     Pulse Rate 02/28/20 1528 69     Resp 02/28/20 1528 14     Temp 02/28/20 1528 98.2 F (36.8 C)     Temp Source 02/28/20 1528 Oral  SpO2 02/28/20 1528 98 %     Weight 02/28/20 1529 51.7 kg (114 lb)     Height 02/28/20 1529 1.549 m (5\' 1" )     Head Circumference --      Peak Flow --      Pain Score --      Pain Loc --      Pain Edu? --      Excl. in GC? --     Constitutional: Alert and oriented Eyes: Conjunctivae are normal.  Head: Atraumatic.  No hematoma noted Nose: No congestion/rhinnorhea. Mouth/Throat: Mucous membranes are moist.   Neck: No pain with axial load, no vertebral tenderness to palpation Cardiovascular: Normal rate, regular rhythm.   Good peripheral circulation.   No chest wall tenderness palpation Respiratory: Normal respiratory effort.  No retractions. Lungs CTAB. Gastrointestinal: Soft and nontender. No distention.  No CVA tenderness.  Musculoskeletal: Full range of motion of all extremities.  No vertebral tenderness palpation.  Mild right tenderness along the hip, patient is able to stand and ambulate, witnessed by me Neurologic:  Normal speech and language. No gross focal neurologic deficits are appreciated.  Skin:  Skin is warm, dry and intact. No rash noted. Psychiatric: Mood and affect are normal. Speech and behavior are normal.  ____________________________________________   LABS (all labs ordered are listed, but only abnormal results are displayed)  Labs Reviewed - No data to display ____________________________________________  EKG None ____________________________________________  RADIOLOGY  CT head and cervical spine no acute abnormality per radiology Hip x-ray reviewed by me, no fracture noted, confirmed by radiology  ____________________________________________   PROCEDURES  Procedure(s) performed: No  Procedures   Critical Care performed: No ____________________________________________   INITIAL IMPRESSION / ASSESSMENT AND PLAN / ED COURSE  Pertinent labs & imaging results that were available during my care of the patient were reviewed by me and considered in my medical decision making (see chart for details).  Patient presents after unwitnessed fall.  Overall she is well-appearing and in no acute distress.  Does have some discomfort to the right hip, suspect contusion, will image the pelvis, hip, head and cervical spine.  She is not on blood thinners.  She is at her baseline mental state per son  Imaging is reassuring, recommend supportive care for likely contusion, mild head injury.  Proper for discharge at this time    ____________________________________________   FINAL CLINICAL IMPRESSION(S) / ED  DIAGNOSES  Final diagnoses:  Fall, initial encounter  Injury of head, initial encounter  Contusion of right hip, initial encounter        Note:  This document was prepared using Dragon voice recognition software and may include unintentional dictation errors.   02-17-1968, MD 02/28/20 [phone]

## 2020-02-28 NOTE — ED Triage Notes (Signed)
Patient brought in by EMS from [facility] for unwitnessed fall. Patient c/o pain where she hit left side of head and also dizziness. Baseline used walker and no trouble walking after fall. HX alzheimers. Son with patient at this time

## 2020-04-05 ENCOUNTER — Other Ambulatory Visit: Payer: Self-pay | Admitting: Neurology

## 2020-04-05 DIAGNOSIS — F0280 Dementia in other diseases classified elsewhere without behavioral disturbance: Secondary | ICD-10-CM

## 2020-04-19 ENCOUNTER — Ambulatory Visit
Admission: RE | Admit: 2020-04-19 | Discharge: 2020-04-19 | Disposition: A | Discharge status: Home / Self Care | Payer: Medicare PPO | Source: Ambulatory Visit | Attending: Neurology | Admitting: Neurology

## 2020-04-19 ENCOUNTER — Other Ambulatory Visit: Payer: Self-pay

## 2020-04-19 DIAGNOSIS — G301 Alzheimer's disease with late onset: Secondary | ICD-10-CM | POA: Diagnosis not present

## 2020-04-19 DIAGNOSIS — F0280 Dementia in other diseases classified elsewhere without behavioral disturbance: Secondary | ICD-10-CM | POA: Insufficient documentation

## 2020-04-19 IMAGING — MR MR HEAD W/O CM
11 series · 46 of 48 positions shown · non-contrast
Comparison: Head CT [DATE].  Brain MRI [DATE].

CLINICAL DATA: Late onset Alzheimer's disease.

EXAM:
MRI HEAD WITHOUT CONTRAST
TECHNIQUE: Multiplanar, multiecho pulse sequences of the brain and surrounding
structures were obtained without intravenous contrast.

[Series 5: ax dwi_tracew · axial · 3.0mm · 0.65mm/px · z∈[-112,+42]mm · 4 of 48 slices shown]
[im 1/48]
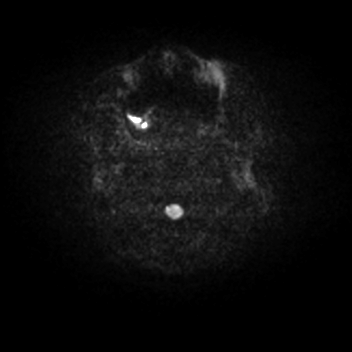
[im 16/48]
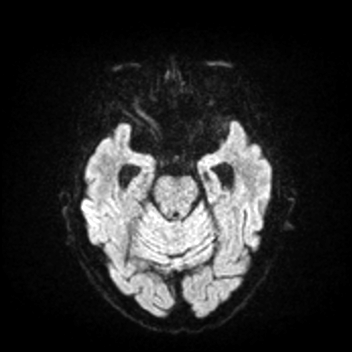
[im 32/48]
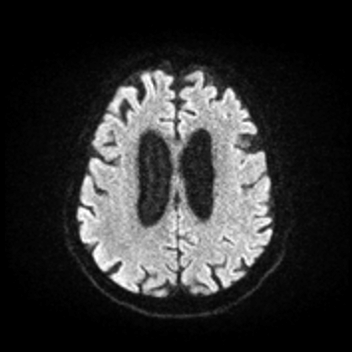
[im 48/48]
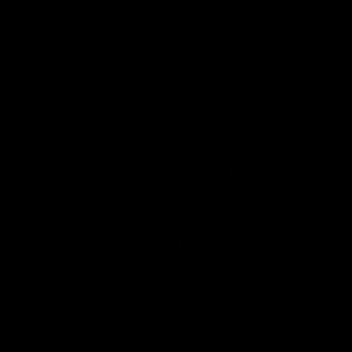

[Series 6: ax dwi_adc · axial · 3.0mm · 0.65mm/px · z∈[-112,+39]mm · 4 of 47 slices shown]
[im 1/47]
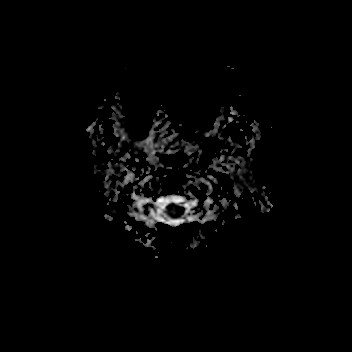
[im 16/47]
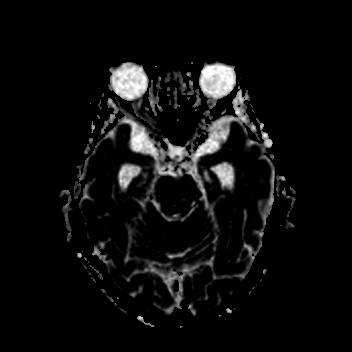
[im 31/47]
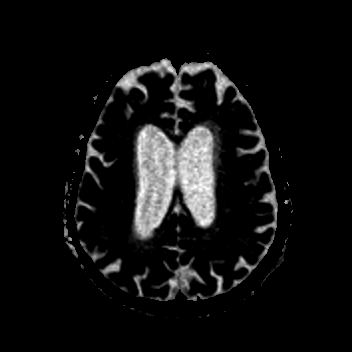
[im 47/47]
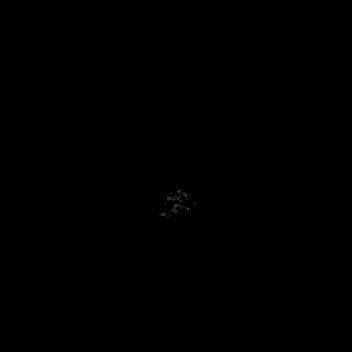

[Series 7: cor dwi_tracew · coronal · 5.0mm · 0.65mm/px · 3 of 40 slices shown]
[im 1/40]
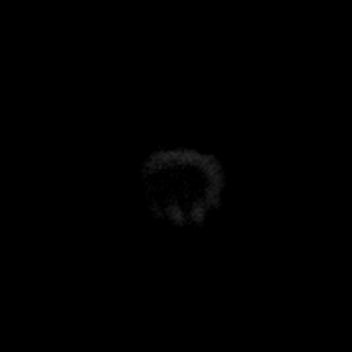
[im 20/40]
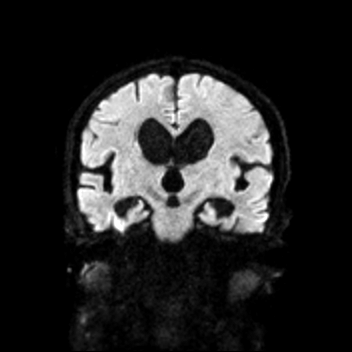
[im 40/40]
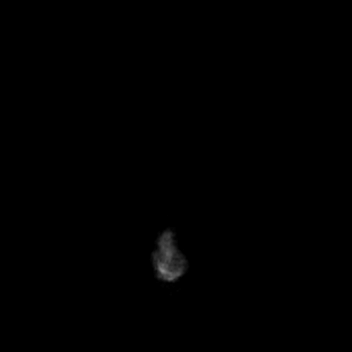

[Series 8: cor dwi_adc · coronal · 5.0mm · 0.65mm/px · 3 of 39 slices shown]
[im 1/39]
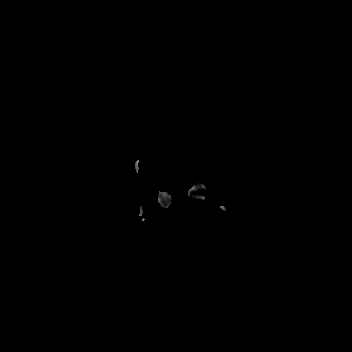
[im 20/39]
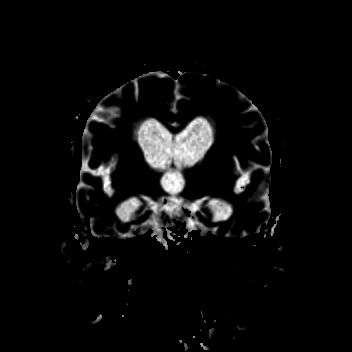
[im 39/39]
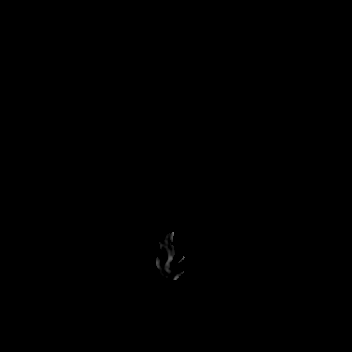

[Series 9: T1 · sagittal · 5.0mm · 0.62mm/px · 2 of 21 slices shown (1 of 2)]
[im 1/21]
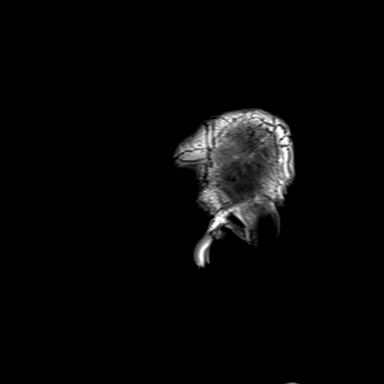
[im 21/21]
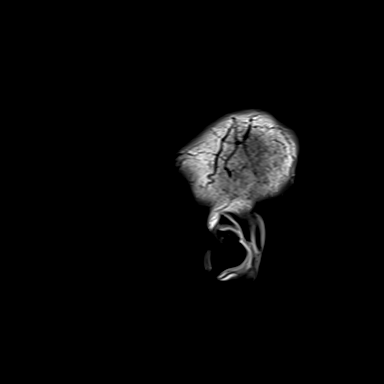

[Series 10: T2 · axial · 5.0mm · 0.53mm/px · z∈[-106,+37]mm · 2 of 25 slices shown (1 of 2)]
[im 1/25]
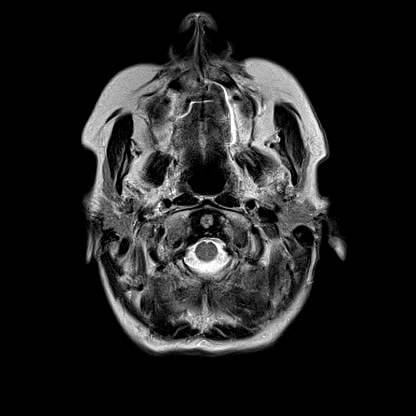
[im 25/25]
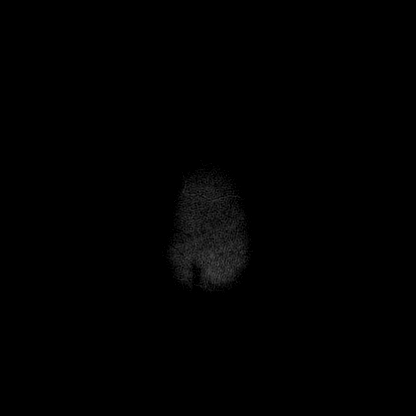

[Series 12: pha_images · axial · 3.0mm · 0.90mm/px · z∈[-118,+54]mm · 5 of 57 slices shown]
[im 1/57]
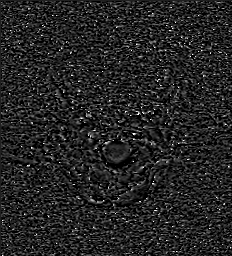
[im 15/57]
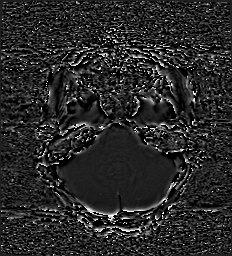
[im 29/57]
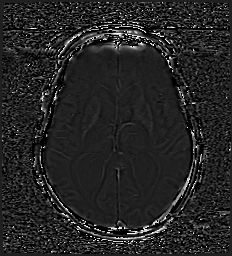
[im 43/57]
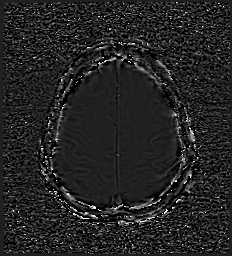
[im 57/57]
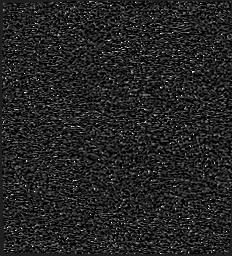

[Series 13: swi_images · axial · 3.0mm · 0.90mm/px · z∈[-121,+54]mm · 5 of 59 slices shown]
[im 1/59]
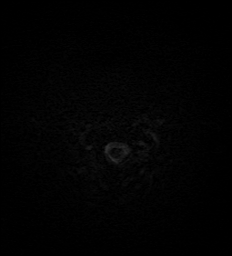
[im 15/59]
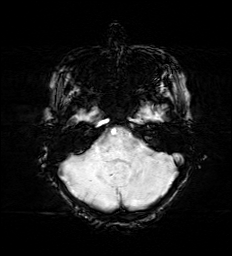
[im 30/59]
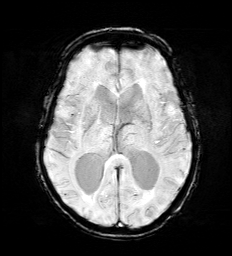
[im 44/59]
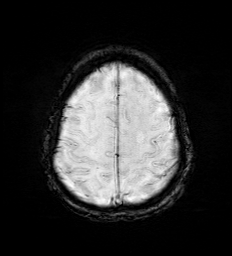
[im 59/59]
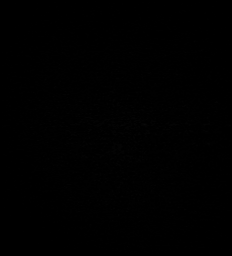

[Series 15: FLAIR · axial · 3.0mm · 0.53mm/px · z∈[-115,+45]mm · 4 of 55 slices shown]
[im 1/55]
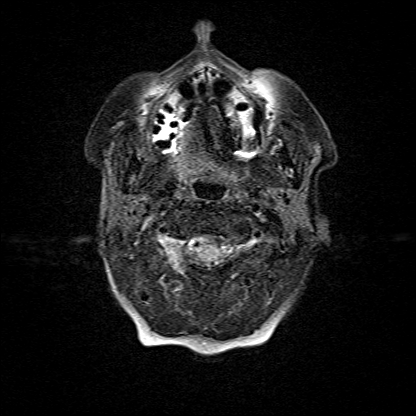
[im 19/55]
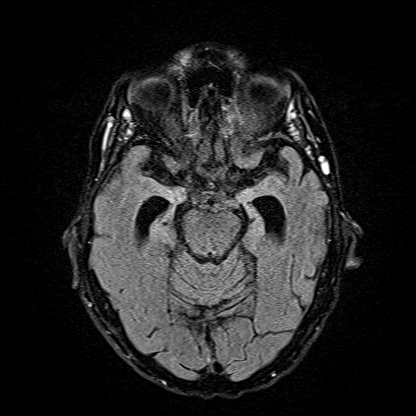
[im 37/55]
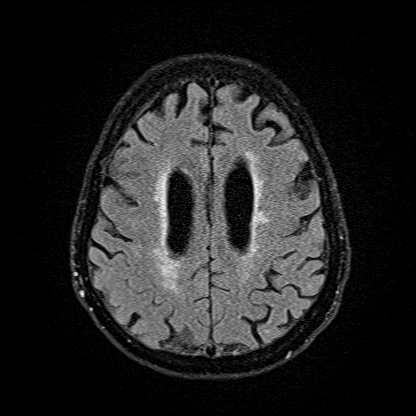
[im 55/55]
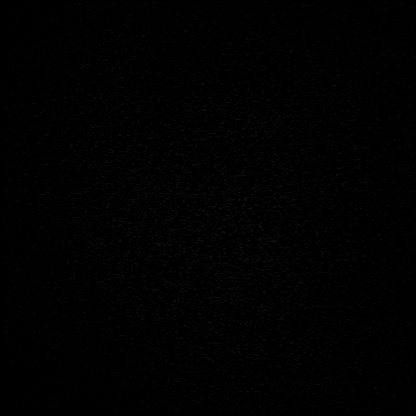

[Series 16: T1 · axial · 1.0mm · 0.98mm/px · z∈[-121,+53]mm · 12 of 176 slices shown (2 of 2)]
[im 1/176]
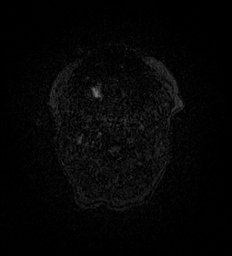
[im 14/176]
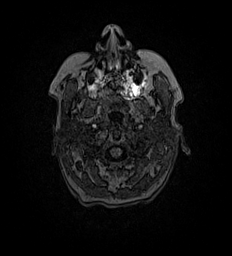
[im 27/176]
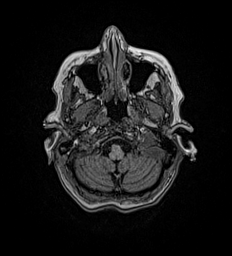
[im 41/176]
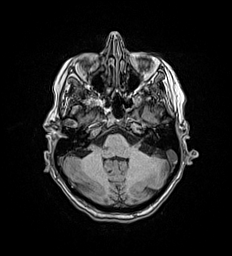
[im 54/176]
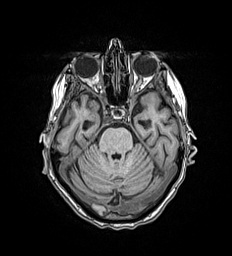
[im 68/176]
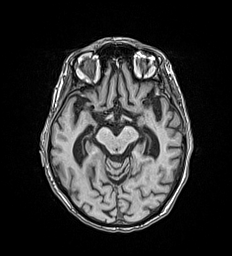
[im 81/176]
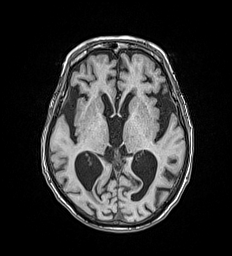
[im 95/176]
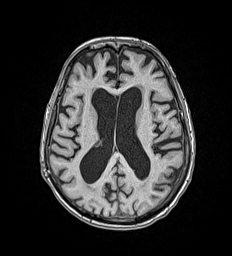
[im 108/176]
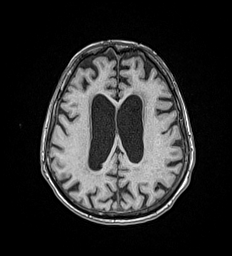
[im 122/176]
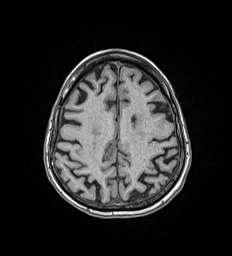
[im 149/176]
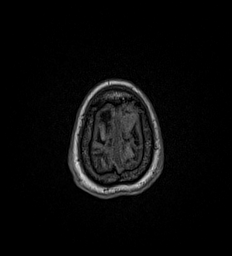
[im 176/176]
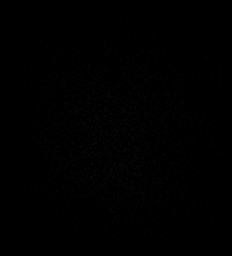

[Series 17: T2 · coronal · 5.0mm · 0.57mm/px · 2 of 29 slices shown (2 of 2)]
[im 1/29]
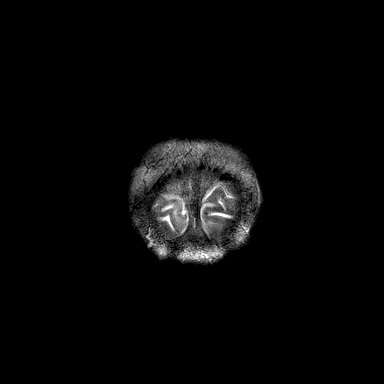
[im 29/29]
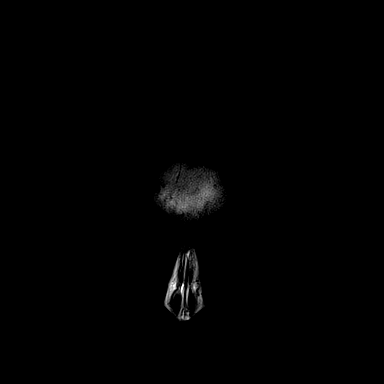

[46 of 48 positions shown; findings below may reference images not displayed]

FINDINGS: Brain:

Stable cerebral atrophy as compared to the MRI of [DATE] and
again most notably affecting the medial temporal lobes.

As before, there is lateral and third ventriculomegaly which may be
related to central predominant atrophy or communicating/normal
pressure hydrocephalus.

Mild multifocal T2/FLAIR hyperintensity within the cerebral white
matter which is nonspecific, but compatible with chronic small
vessel ischemic disease.

Redemonstrated chronic microhemorrhage within the posterior right
temporal lobe (series 13, image 26).

There is no acute infarct.

No evidence of intracranial mass.

No extra-axial fluid collection.

No midline shift.

Vascular: Expected proximal arterial flow voids.

Skull and upper cervical spine: No focal marrow lesion.

Sinuses/Orbits: Visualized orbits show no acute finding. Trace
bilateral ethmoid sinus mucosal thickening.
IMPRESSION: Stable non-contrast MRI appearance of the brain as compared to
[DATE].

Stable cerebral atrophy which most notably affects the medial
temporal lobes.

As before, there is prominence of the lateral and third ventricles
which may be related to central predominant atrophy or
communicating/normal pressure hydrocephalus.

Stable mild cerebral white matter chronic small vessel ischemic
disease.

## 2020-04-26 ENCOUNTER — Other Ambulatory Visit: Payer: Self-pay | Admitting: Internal Medicine

## 2020-04-26 DIAGNOSIS — Z1231 Encounter for screening mammogram for malignant neoplasm of breast: Secondary | ICD-10-CM

## 2020-05-09 ENCOUNTER — Other Ambulatory Visit: Payer: Self-pay

## 2020-05-09 ENCOUNTER — Ambulatory Visit
Admission: RE | Admit: 2020-05-09 | Discharge: 2020-05-09 | Disposition: A | Discharge status: Home / Self Care | Payer: Medicare PPO | Source: Ambulatory Visit | Attending: Internal Medicine | Admitting: Internal Medicine

## 2020-05-09 DIAGNOSIS — Z1231 Encounter for screening mammogram for malignant neoplasm of breast: Secondary | ICD-10-CM | POA: Diagnosis present

## 2020-05-09 IMAGING — MG MM DIGITAL SCREENING BILAT W/ TOMO AND CAD
6 of 12 series · 6 of 36 positions shown · non-contrast
Comparison: Previous exam(s).

CLINICAL DATA: Screening.

EXAM:
DIGITAL SCREENING BILATERAL MAMMOGRAM WITH TOMOSYNTHESIS AND CAD
TECHNIQUE: Bilateral screening digital craniocaudal and mediolateral oblique
mammograms were obtained. Bilateral screening digital breast
tomosynthesis was performed. The images were evaluated with
computer-aided detection.

[L MLO synth-2D (1 of 2)]
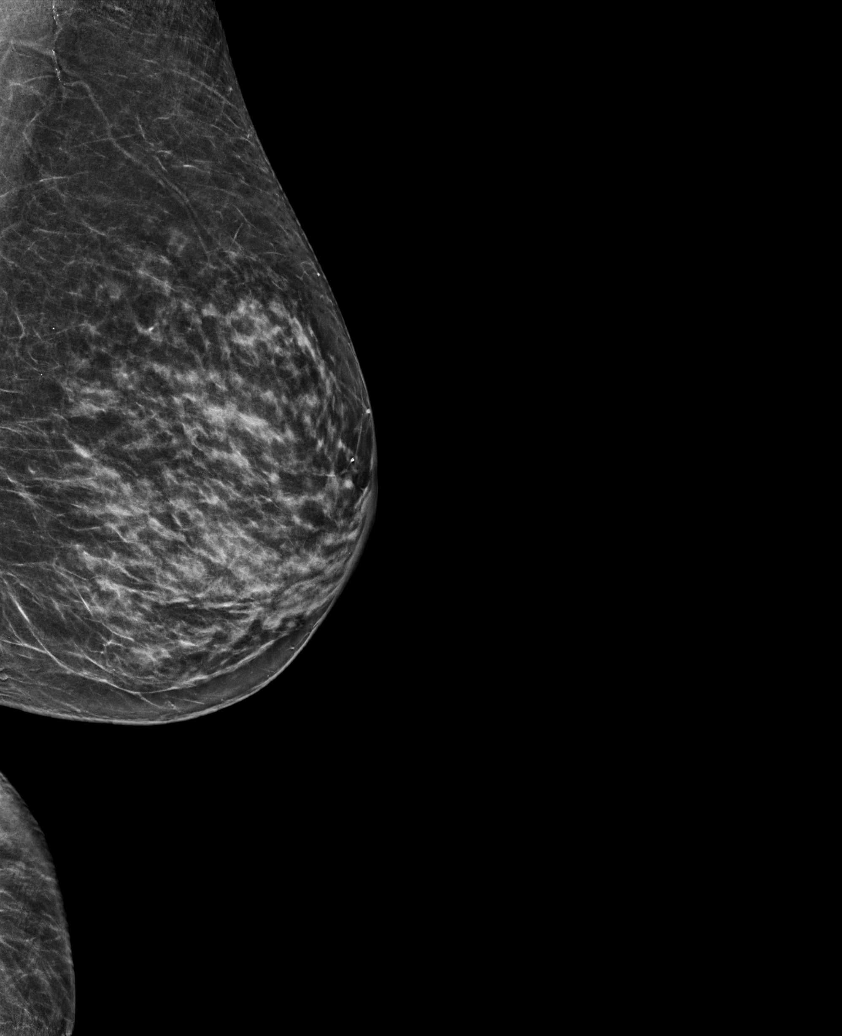

[L MLO synth-2D (2 of 2)]
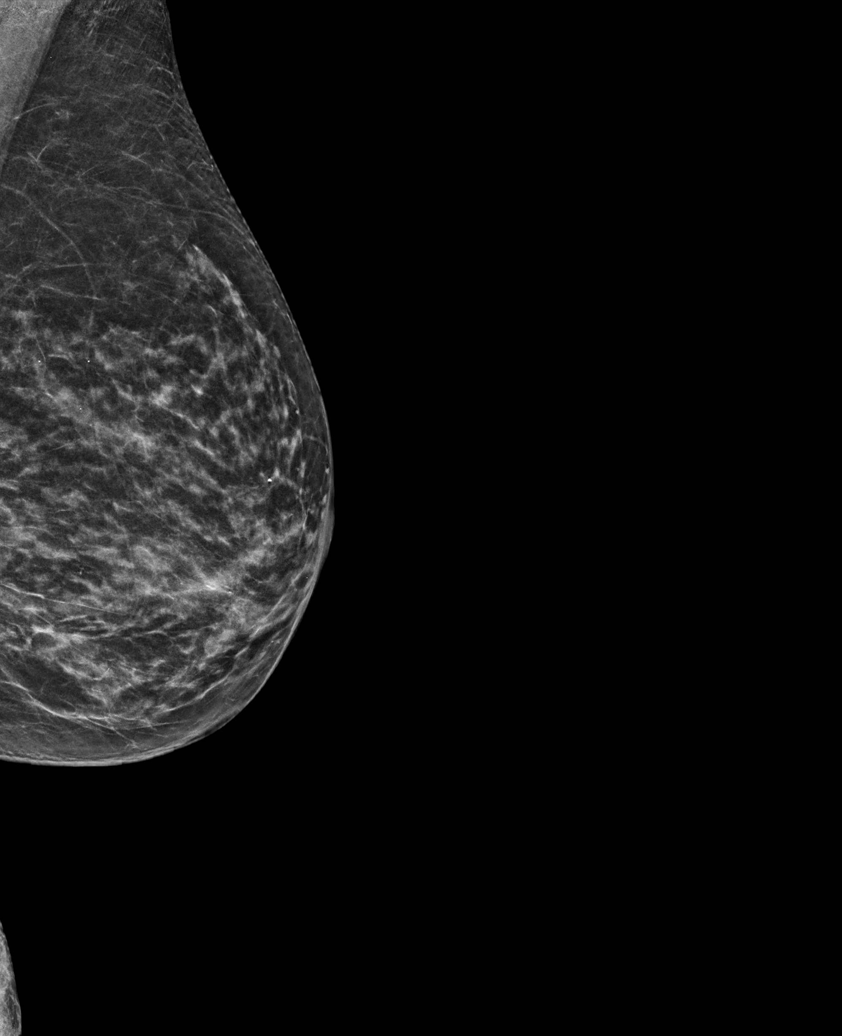

[R MLO synth-2D]
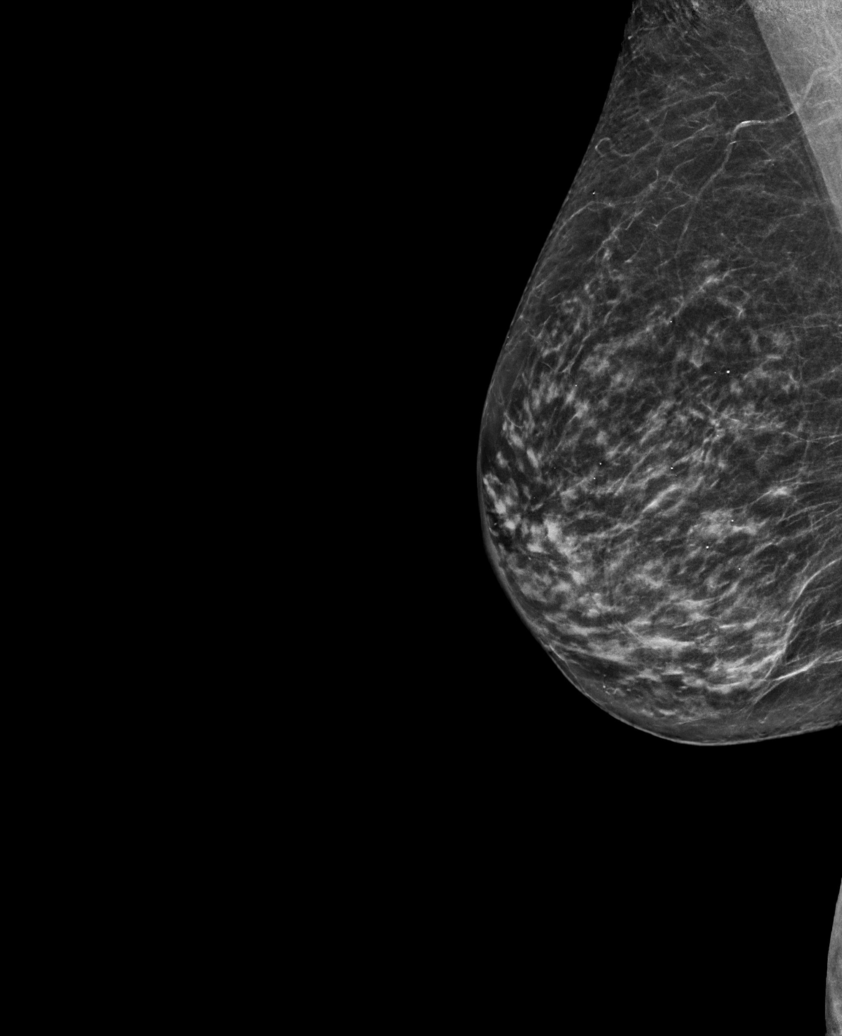

[R CC synth-2D (1 of 2)]
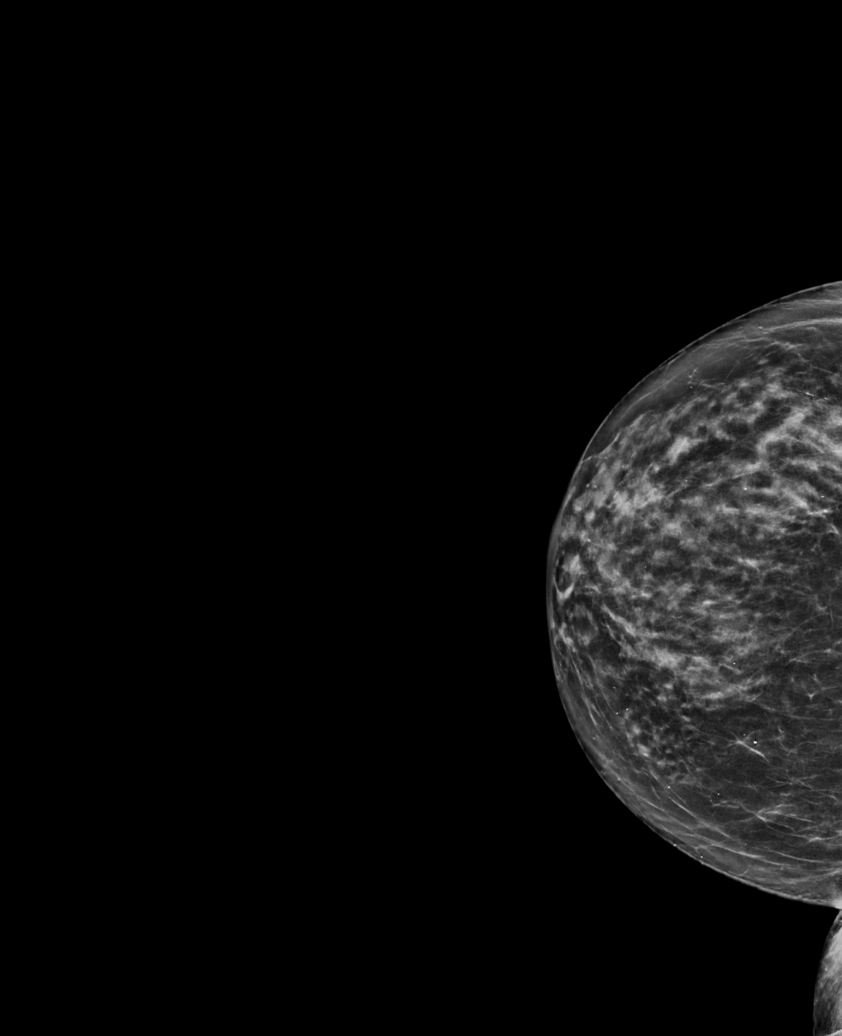

[R CC synth-2D (2 of 2)]
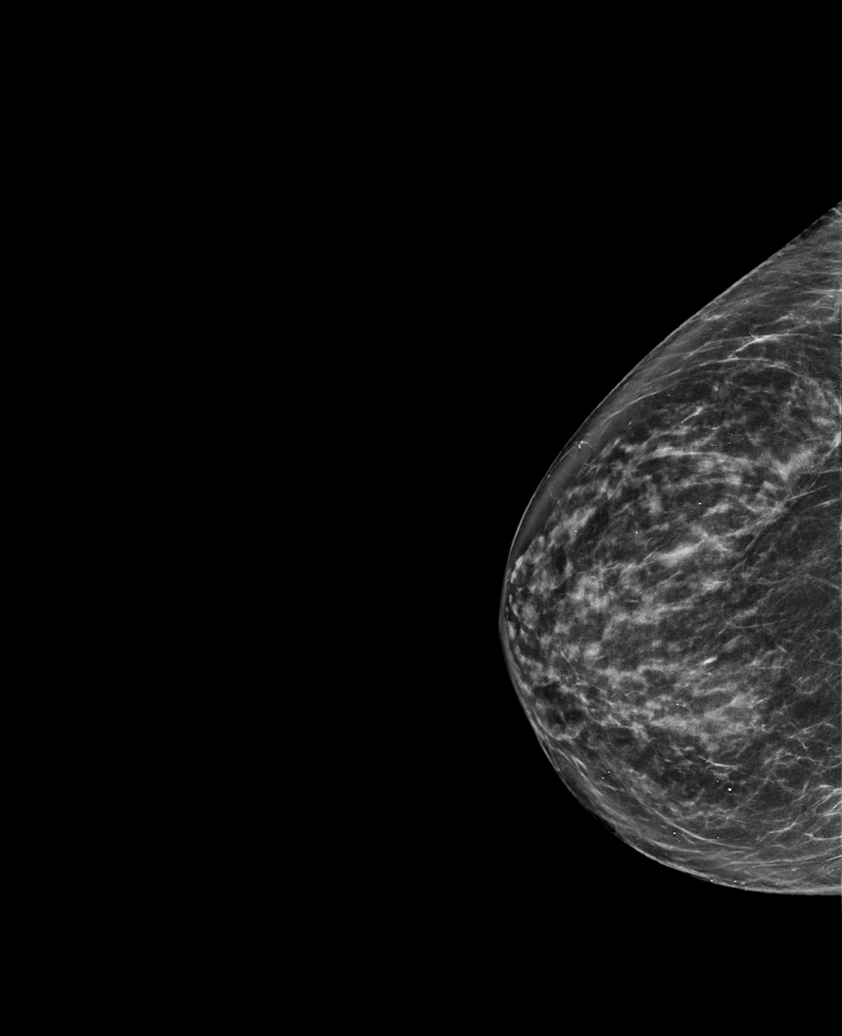

[L CC synth-2D]
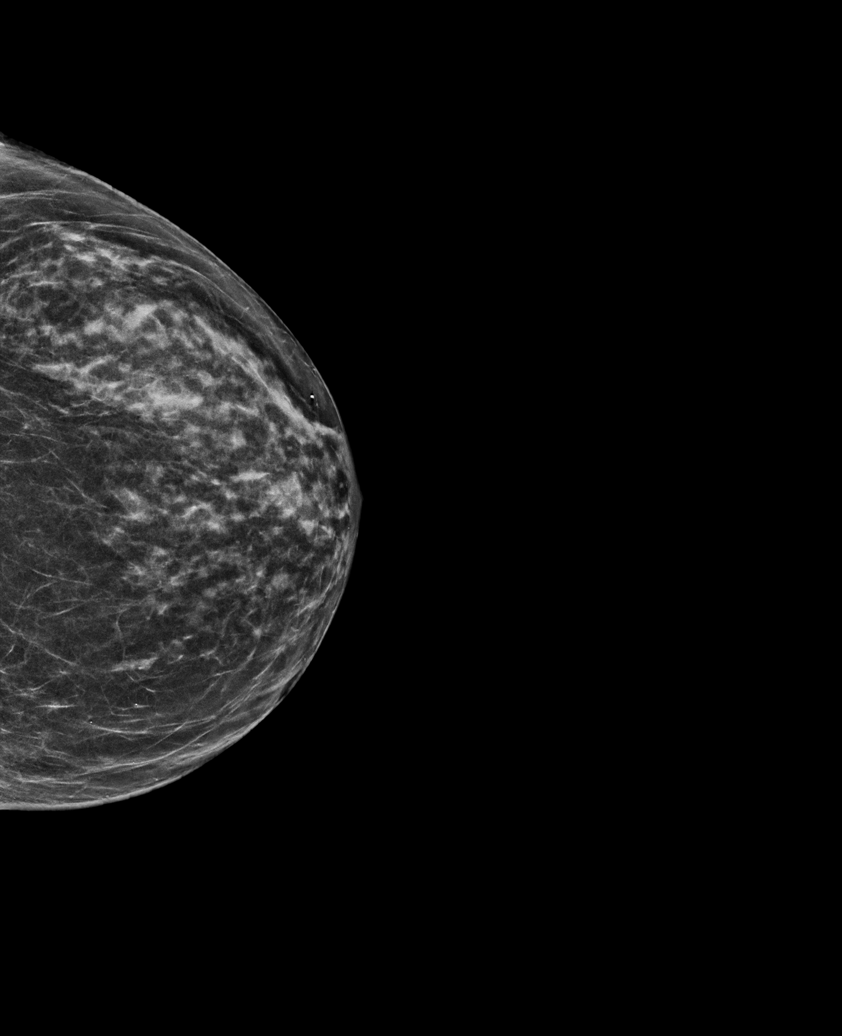

[6 of 36 positions shown; findings below may reference images not displayed]

ACR Breast Density Category c: The breast tissue is heterogeneously
dense, which may obscure small masses.
FINDINGS: There are no findings suspicious for malignancy. The images were
evaluated with computer-aided detection.
IMPRESSION: No mammographic evidence of malignancy. A result letter of this
screening mammogram will be mailed directly to the patient.

RECOMMENDATION:
Screening mammogram in one year. (Code:[6J])

BI-RADS CATEGORY  1: Negative.

## 2020-05-11 ENCOUNTER — Inpatient Hospital Stay
Admission: RE | Admit: 2020-05-11 | Discharge: 2020-05-11 | Disposition: A | Discharge status: Home / Self Care | Payer: Self-pay | Source: Ambulatory Visit | Attending: *Deleted | Admitting: *Deleted

## 2020-05-11 ENCOUNTER — Other Ambulatory Visit: Payer: Self-pay | Admitting: *Deleted

## 2020-05-11 DIAGNOSIS — Z1231 Encounter for screening mammogram for malignant neoplasm of breast: Secondary | ICD-10-CM

## 2021-08-31 ENCOUNTER — Inpatient Hospital Stay: Payer: Medicare PPO

## 2021-08-31 ENCOUNTER — Inpatient Hospital Stay: Payer: Medicare PPO | Admitting: Anesthesiology

## 2021-08-31 ENCOUNTER — Other Ambulatory Visit: Payer: Self-pay

## 2021-08-31 ENCOUNTER — Emergency Department: Payer: Medicare PPO

## 2021-08-31 ENCOUNTER — Encounter: Payer: Self-pay | Admitting: Emergency Medicine

## 2021-08-31 ENCOUNTER — Encounter
Admission: EM | Disposition: A | Discharge status: Hospice | Payer: Self-pay | Source: Skilled Nursing Facility | Attending: Internal Medicine

## 2021-08-31 ENCOUNTER — Inpatient Hospital Stay
Admission: EM | Admit: 2021-08-31 | Discharge: 2021-09-09 | DRG: 481 | Disposition: A | Discharge status: Hospice | Payer: Medicare PPO | Source: Skilled Nursing Facility | Attending: Internal Medicine | Admitting: Internal Medicine

## 2021-08-31 DIAGNOSIS — F0390 Unspecified dementia without behavioral disturbance: Secondary | ICD-10-CM | POA: Diagnosis present

## 2021-08-31 DIAGNOSIS — M810 Age-related osteoporosis without current pathological fracture: Secondary | ICD-10-CM | POA: Diagnosis present

## 2021-08-31 DIAGNOSIS — D72829 Elevated white blood cell count, unspecified: Secondary | ICD-10-CM | POA: Diagnosis not present

## 2021-08-31 DIAGNOSIS — M8008XA Age-related osteoporosis with current pathological fracture, vertebra(e), initial encounter for fracture: Secondary | ICD-10-CM | POA: Diagnosis present

## 2021-08-31 DIAGNOSIS — F0280 Dementia in other diseases classified elsewhere without behavioral disturbance: Secondary | ICD-10-CM | POA: Diagnosis present

## 2021-08-31 DIAGNOSIS — Z66 Do not resuscitate: Secondary | ICD-10-CM | POA: Diagnosis present

## 2021-08-31 DIAGNOSIS — Z79899 Other long term (current) drug therapy: Secondary | ICD-10-CM

## 2021-08-31 DIAGNOSIS — S72002A Fracture of unspecified part of neck of left femur, initial encounter for closed fracture: Secondary | ICD-10-CM

## 2021-08-31 DIAGNOSIS — G309 Alzheimer's disease, unspecified: Secondary | ICD-10-CM | POA: Diagnosis present

## 2021-08-31 DIAGNOSIS — T4275XA Adverse effect of unspecified antiepileptic and sedative-hypnotic drugs, initial encounter: Secondary | ICD-10-CM | POA: Diagnosis not present

## 2021-08-31 DIAGNOSIS — M80052A Age-related osteoporosis with current pathological fracture, left femur, initial encounter for fracture: Principal | ICD-10-CM | POA: Diagnosis present

## 2021-08-31 DIAGNOSIS — Z515 Encounter for palliative care: Secondary | ICD-10-CM

## 2021-08-31 DIAGNOSIS — E785 Hyperlipidemia, unspecified: Secondary | ICD-10-CM | POA: Diagnosis present

## 2021-08-31 DIAGNOSIS — E876 Hypokalemia: Secondary | ICD-10-CM | POA: Diagnosis not present

## 2021-08-31 DIAGNOSIS — R8281 Pyuria: Secondary | ICD-10-CM | POA: Diagnosis not present

## 2021-08-31 DIAGNOSIS — R404 Transient alteration of awareness: Secondary | ICD-10-CM | POA: Diagnosis not present

## 2021-08-31 DIAGNOSIS — R030 Elevated blood-pressure reading, without diagnosis of hypertension: Secondary | ICD-10-CM | POA: Diagnosis present

## 2021-08-31 DIAGNOSIS — Z8781 Personal history of (healed) traumatic fracture: Secondary | ICD-10-CM

## 2021-08-31 DIAGNOSIS — Z7189 Other specified counseling: Secondary | ICD-10-CM | POA: Diagnosis not present

## 2021-08-31 DIAGNOSIS — E7800 Pure hypercholesterolemia, unspecified: Secondary | ICD-10-CM | POA: Diagnosis present

## 2021-08-31 DIAGNOSIS — S72142A Displaced intertrochanteric fracture of left femur, initial encounter for closed fracture: Secondary | ICD-10-CM | POA: Diagnosis present

## 2021-08-31 DIAGNOSIS — Y92099 Unspecified place in other non-institutional residence as the place of occurrence of the external cause: Secondary | ICD-10-CM | POA: Diagnosis not present

## 2021-08-31 DIAGNOSIS — S72145A Nondisplaced intertrochanteric fracture of left femur, initial encounter for closed fracture: Secondary | ICD-10-CM | POA: Diagnosis not present

## 2021-08-31 DIAGNOSIS — R4189 Other symptoms and signs involving cognitive functions and awareness: Secondary | ICD-10-CM | POA: Diagnosis present

## 2021-08-31 DIAGNOSIS — W19XXXA Unspecified fall, initial encounter: Secondary | ICD-10-CM | POA: Diagnosis present

## 2021-08-31 DIAGNOSIS — D696 Thrombocytopenia, unspecified: Secondary | ICD-10-CM | POA: Diagnosis present

## 2021-08-31 DIAGNOSIS — D62 Acute posthemorrhagic anemia: Secondary | ICD-10-CM | POA: Diagnosis not present

## 2021-08-31 HISTORY — PX: INTRAMEDULLARY (IM) NAIL INTERTROCHANTERIC: SHX5875

## 2021-08-31 LAB — COMPREHENSIVE METABOLIC PANEL
ALT: 17 U/L (ref 0–44)
AST: 21 U/L (ref 15–41)
Albumin: 3.6 g/dL (ref 3.5–5.0)
Alkaline Phosphatase: 44 U/L (ref 38–126)
Anion gap: 6 (ref 5–15)
BUN: 13 mg/dL (ref 8–23)
CO2: 27 mmol/L (ref 22–32)
Calcium: 8.5 mg/dL — ABNORMAL LOW (ref 8.9–10.3)
Chloride: 107 mmol/L (ref 98–111)
Creatinine, Ser: 0.84 mg/dL (ref 0.44–1.00)
GFR, Estimated: >60 mL/min (ref >60)
Glucose, Bld: 109 mg/dL — ABNORMAL HIGH (ref 70–99)
Potassium: 4.1 mmol/L (ref 3.5–5.1)
Sodium: 140 mmol/L (ref 135–145)
Total Bilirubin: 0.7 mg/dL (ref 0.3–1.2)
Total Protein: 6.6 g/dL (ref 6.5–8.1)

## 2021-08-31 LAB — CBC WITH DIFFERENTIAL/PLATELET
Abs Immature Granulocytes: 0.07 10*3/uL (ref 0.00–0.07)
Basophils Absolute: 0 10*3/uL (ref 0.0–0.1)
Basophils Relative: 0 %
Eosinophils Absolute: 0 10*3/uL (ref 0.0–0.5)
Eosinophils Relative: 0 %
HCT: 39.7 % (ref 36.0–46.0)
Hemoglobin: 12.7 g/dL (ref 12.0–15.0)
Immature Granulocytes: 1 %
Lymphocytes Relative: 9 %
Lymphs Abs: 1 10*3/uL (ref 0.7–4.0)
MCH: 29.5 pg (ref 26.0–34.0)
MCHC: 32 g/dL (ref 30.0–36.0)
MCV: 92.1 fL (ref 80.0–100.0)
Monocytes Absolute: 0.5 10*3/uL (ref 0.1–1.0)
Monocytes Relative: 5 %
Neutro Abs: 9 10*3/uL — ABNORMAL HIGH (ref 1.7–7.7)
Neutrophils Relative %: 85 %
Platelets: 134 10*3/uL — ABNORMAL LOW (ref 150–400)
RBC: 4.31 MIL/uL (ref 3.87–5.11)
RDW: 13.5 % (ref 11.5–15.5)
WBC: 10.5 10*3/uL (ref 4.0–10.5)
nRBC: 0 % (ref 0.0–0.2)

## 2021-08-31 LAB — TYPE AND SCREEN
ABO/RH(D): A POS
Antibody Screen: NEGATIVE

## 2021-08-31 LAB — GLUCOSE, CAPILLARY
Glucose-Capillary: 183 mg/dL — ABNORMAL HIGH (ref 70–99)
Glucose-Capillary: 184 mg/dL — ABNORMAL HIGH (ref 70–99)

## 2021-08-31 LAB — BLOOD GAS, VENOUS
Acid-Base Excess: 3.2 mmol/L — ABNORMAL HIGH (ref 0.0–2.0)
Bicarbonate: 29.9 mmol/L — ABNORMAL HIGH (ref 20.0–28.0)
O2 Saturation: 62.6 %
Patient temperature: 37
pCO2, Ven: 53 mmHg (ref 44–60)
pH, Ven: 7.36 (ref 7.25–7.43)
pO2, Ven: 37 mmHg (ref 32–45)

## 2021-08-31 LAB — PROTIME-INR
INR: 1 (ref 0.8–1.2)
Prothrombin Time: 13.2 s (ref 11.4–15.2)

## 2021-08-31 LAB — APTT: aPTT: 28 s (ref 24–36)

## 2021-08-31 SURGERY — FIXATION, FRACTURE, INTERTROCHANTERIC, WITH INTRAMEDULLARY ROD
Anesthesia: General | Laterality: Left

## 2021-08-31 MED ORDER — MORPHINE SULFATE (PF) 2 MG/ML IV SOLN: 0.5000 mg | INTRAVENOUS | Status: DC | PRN

## 2021-08-31 MED ORDER — BUPIVACAINE HCL (PF) 0.5 % IJ SOLN
INTRAMUSCULAR | Status: DC | PRN
  Administered 2021-08-31: 30 mL

## 2021-08-31 MED ORDER — PROPOFOL 10 MG/ML IV BOLUS
INTRAVENOUS | Status: AC
  Filled 2021-08-31: qty 20

## 2021-08-31 MED ORDER — ACETAMINOPHEN 10 MG/ML IV SOLN
INTRAVENOUS | Status: AC
  Filled 2021-08-31: qty 100

## 2021-08-31 MED ORDER — OXYCODONE HCL 5 MG PO TABS: 5.0000 mg | ORAL_TABLET | Freq: Once | ORAL | Status: DC | PRN

## 2021-08-31 MED ORDER — DONEPEZIL HCL 5 MG PO TABS
10.0000 mg | ORAL_TABLET | Freq: Every day | ORAL | Status: DC
Start: 2021-08-31 — End: 2021-09-09
  Administered 2021-08-31 – 2021-09-08 (×9): 10 mg via ORAL
  Filled 2021-08-31 (×9): qty 2

## 2021-08-31 MED ORDER — BUPIVACAINE LIPOSOME 1.3 % IJ SUSP
INTRAMUSCULAR | Status: AC
  Filled 2021-08-31: qty 20

## 2021-08-31 MED ORDER — ROCURONIUM BROMIDE 10 MG/ML (PF) SYRINGE
PREFILLED_SYRINGE | INTRAVENOUS | Status: AC
  Filled 2021-08-31: qty 10

## 2021-08-31 MED ORDER — ROCURONIUM BROMIDE 100 MG/10ML IV SOLN
INTRAVENOUS | Status: DC | PRN
  Administered 2021-08-31: 30 mg via INTRAVENOUS

## 2021-08-31 MED ORDER — FENTANYL CITRATE (PF) 100 MCG/2ML IJ SOLN: 25.0000 ug | INTRAMUSCULAR | Status: DC | PRN

## 2021-08-31 MED ORDER — KETAMINE HCL 50 MG/5ML IJ SOSY
PREFILLED_SYRINGE | INTRAMUSCULAR | Status: AC
  Filled 2021-08-31: qty 5

## 2021-08-31 MED ORDER — HYDROCODONE-ACETAMINOPHEN 5-325 MG PO TABS: 1.0000 | ORAL_TABLET | Freq: Four times a day (QID) | ORAL | Status: DC | PRN

## 2021-08-31 MED ORDER — ONDANSETRON HCL 4 MG/2ML IJ SOLN
INTRAMUSCULAR | Status: DC | PRN
  Administered 2021-08-31: 4 mg via INTRAVENOUS

## 2021-08-31 MED ORDER — SODIUM CHLORIDE 0.9 % IV SOLN: INTRAVENOUS | Status: DC

## 2021-08-31 MED ORDER — DEXAMETHASONE SODIUM PHOSPHATE 10 MG/ML IJ SOLN
INTRAMUSCULAR | Status: AC
  Filled 2021-08-31: qty 1

## 2021-08-31 MED ORDER — VITAMIN D 25 MCG (1000 UNIT) PO TABS
2000.0000 [IU] | ORAL_TABLET | Freq: Every day | ORAL | Status: DC
  Administered 2021-08-31 – 2021-09-09 (×10): 2000 [IU] via ORAL
  Filled 2021-08-31 (×10): qty 2

## 2021-08-31 MED ORDER — DOCUSATE SODIUM 100 MG PO CAPS
100.0000 mg | ORAL_CAPSULE | Freq: Two times a day (BID) | ORAL | Status: DC
  Administered 2021-08-31 – 2021-09-09 (×18): 100 mg via ORAL
  Filled 2021-08-31 (×18): qty 1

## 2021-08-31 MED ORDER — METOCLOPRAMIDE HCL 5 MG PO TABS: 5.0000 mg | ORAL_TABLET | Freq: Three times a day (TID) | ORAL | Status: DC | PRN

## 2021-08-31 MED ORDER — OXYCODONE HCL 5 MG PO TABS
5.0000 mg | ORAL_TABLET | ORAL | Status: DC | PRN
  Administered 2021-09-05 (×3): 10 mg via ORAL
  Filled 2021-08-31 (×3): qty 2

## 2021-08-31 MED ORDER — POLYETHYLENE GLYCOL 3350 17 G PO PACK
17.0000 g | PACK | Freq: Every day | ORAL | Status: DC | PRN
Start: 2021-08-31 — End: 2021-09-09

## 2021-08-31 MED ORDER — OXYCODONE HCL 5 MG/5ML PO SOLN: 5.0000 mg | Freq: Once | ORAL | Status: DC | PRN

## 2021-08-31 MED ORDER — METOCLOPRAMIDE HCL 5 MG/ML IJ SOLN: 5.0000 mg | Freq: Three times a day (TID) | INTRAMUSCULAR | Status: DC | PRN

## 2021-08-31 MED ORDER — LACTATED RINGERS IV SOLN: INTRAVENOUS | Status: DC | PRN

## 2021-08-31 MED ORDER — SENNA 8.6 MG PO TABS
1.0000 | ORAL_TABLET | Freq: Two times a day (BID) | ORAL | Status: DC
Start: 2021-08-31 — End: 2021-08-31

## 2021-08-31 MED ORDER — ONDANSETRON HCL 4 MG/2ML IJ SOLN
INTRAMUSCULAR | Status: AC
  Filled 2021-08-31: qty 2

## 2021-08-31 MED ORDER — PHENYLEPHRINE HCL (PRESSORS) 10 MG/ML IV SOLN
INTRAVENOUS | Status: DC | PRN
  Administered 2021-08-31: 80 ug via INTRAVENOUS
  Administered 2021-08-31: 160 ug via INTRAVENOUS
  Administered 2021-08-31 (×4): 80 ug via INTRAVENOUS

## 2021-08-31 MED ORDER — TRAMADOL HCL 50 MG PO TABS: 50.0000 mg | ORAL_TABLET | Freq: Four times a day (QID) | ORAL | Status: DC | PRN

## 2021-08-31 MED ORDER — TRANEXAMIC ACID-NACL 1000-0.7 MG/100ML-% IV SOLN
INTRAVENOUS | Status: AC
  Administered 2021-08-31: 1000 mg via INTRAVENOUS
  Filled 2021-08-31: qty 100

## 2021-08-31 MED ORDER — OXYCODONE HCL 5 MG PO TABS: 2.5000 mg | ORAL_TABLET | ORAL | Status: DC | PRN

## 2021-08-31 MED ORDER — LIDOCAINE HCL (PF) 2 % IJ SOLN
INTRAMUSCULAR | Status: AC
  Filled 2021-08-31: qty 5

## 2021-08-31 MED ORDER — KETOROLAC TROMETHAMINE 15 MG/ML IJ SOLN
7.5000 mg | Freq: Four times a day (QID) | INTRAMUSCULAR | Status: AC
  Administered 2021-08-31 – 2021-09-01 (×4): 7.5 mg via INTRAVENOUS
  Filled 2021-08-31 (×4): qty 1

## 2021-08-31 MED ORDER — PRO-STAT 101 PO LIQD: 30.0000 mL | Freq: Every day | ORAL | Status: DC

## 2021-08-31 MED ORDER — ENSURE ENLIVE PO LIQD
237.0000 mL | Freq: Two times a day (BID) | ORAL | Status: DC
  Administered 2021-09-01 – 2021-09-09 (×13): 237 mL via ORAL
  Filled 2021-08-31: qty 237

## 2021-08-31 MED ORDER — PROPOFOL 10 MG/ML IV BOLUS
INTRAVENOUS | Status: DC | PRN
  Administered 2021-08-31: 100 mg via INTRAVENOUS

## 2021-08-31 MED ORDER — FENTANYL CITRATE (PF) 100 MCG/2ML IJ SOLN
INTRAMUSCULAR | Status: AC
  Filled 2021-08-31: qty 2

## 2021-08-31 MED ORDER — CEFAZOLIN SODIUM-DEXTROSE 2-4 GM/100ML-% IV SOLN
2.0000 g | Freq: Four times a day (QID) | INTRAVENOUS | Status: AC
  Administered 2021-08-31 – 2021-09-01 (×3): 2 g via INTRAVENOUS
  Filled 2021-08-31 (×3): qty 100

## 2021-08-31 MED ORDER — BISACODYL 5 MG PO TBEC: 5.0000 mg | DELAYED_RELEASE_TABLET | Freq: Every day | ORAL | Status: DC | PRN

## 2021-08-31 MED ORDER — ONDANSETRON HCL 4 MG/2ML IJ SOLN: 4.0000 mg | Freq: Four times a day (QID) | INTRAMUSCULAR | Status: DC | PRN

## 2021-08-31 MED ORDER — SODIUM CHLORIDE 0.9 % IR SOLN
Status: DC | PRN
  Administered 2021-08-31: 1000 mL

## 2021-08-31 MED ORDER — EPHEDRINE SULFATE (PRESSORS) 50 MG/ML IJ SOLN
INTRAMUSCULAR | Status: DC | PRN
  Administered 2021-08-31 (×3): 5 mg via INTRAVENOUS
  Administered 2021-08-31: 10 mg via INTRAVENOUS

## 2021-08-31 MED ORDER — BISACODYL 10 MG RE SUPP: 10.0000 mg | Freq: Every day | RECTAL | Status: DC | PRN

## 2021-08-31 MED ORDER — LACTATED RINGERS IV BOLUS
250.0000 mL | Freq: Once | INTRAVENOUS | Status: AC
  Administered 2021-08-31: 250 mL via INTRAVENOUS

## 2021-08-31 MED ORDER — FENTANYL CITRATE (PF) 100 MCG/2ML IJ SOLN: INTRAMUSCULAR | Status: DC | PRN

## 2021-08-31 MED ORDER — TRANEXAMIC ACID-NACL 1000-0.7 MG/100ML-% IV SOLN: 1000.0000 mg | Freq: Once | INTRAVENOUS | Status: AC

## 2021-08-31 MED ORDER — RALOXIFENE HCL 60 MG PO TABS
60.0000 mg | ORAL_TABLET | Freq: Every day | ORAL | Status: DC
  Administered 2021-09-01 – 2021-09-09 (×9): 60 mg via ORAL
  Filled 2021-08-31 (×9): qty 1

## 2021-08-31 MED ORDER — ASCORBIC ACID 500 MG PO TABS
500.0000 mg | ORAL_TABLET | Freq: Two times a day (BID) | ORAL | Status: DC
  Administered 2021-08-31 – 2021-09-09 (×18): 500 mg via ORAL
  Filled 2021-08-31 (×18): qty 1

## 2021-08-31 MED ORDER — SERTRALINE HCL 50 MG PO TABS
100.0000 mg | ORAL_TABLET | Freq: Every day | ORAL | Status: DC
  Administered 2021-08-31 – 2021-09-08 (×9): 100 mg via ORAL
  Filled 2021-08-31 (×9): qty 2

## 2021-08-31 MED ORDER — LIDOCAINE HCL (CARDIAC) PF 100 MG/5ML IV SOSY
PREFILLED_SYRINGE | INTRAVENOUS | Status: DC | PRN
  Administered 2021-08-31: 60 mg via INTRAVENOUS

## 2021-08-31 MED ORDER — HYDROMORPHONE HCL 1 MG/ML IJ SOLN
0.2000 mg | INTRAMUSCULAR | Status: DC | PRN
  Filled 2021-08-31: qty 1

## 2021-08-31 MED ORDER — ENOXAPARIN SODIUM 40 MG/0.4ML IJ SOSY
40.0000 mg | PREFILLED_SYRINGE | INTRAMUSCULAR | Status: DC
  Administered 2021-09-01 – 2021-09-02 (×2): 40 mg via SUBCUTANEOUS
  Filled 2021-08-31 (×2): qty 0.4

## 2021-08-31 MED ORDER — BUPIVACAINE HCL (PF) 0.5 % IJ SOLN
INTRAMUSCULAR | Status: AC
  Filled 2021-08-31: qty 30

## 2021-08-31 MED ORDER — DEXAMETHASONE SODIUM PHOSPHATE 10 MG/ML IJ SOLN
INTRAMUSCULAR | Status: DC | PRN
  Administered 2021-08-31: 4 mg via INTRAVENOUS

## 2021-08-31 MED ORDER — SENNOSIDES-DOCUSATE SODIUM 8.6-50 MG PO TABS: 1.0000 | ORAL_TABLET | Freq: Every evening | ORAL | Status: DC | PRN

## 2021-08-31 MED ORDER — METHOCARBAMOL 1000 MG/10ML IJ SOLN: 500.0000 mg | Freq: Four times a day (QID) | INTRAVENOUS | Status: DC | PRN

## 2021-08-31 MED ORDER — METHOCARBAMOL 500 MG PO TABS
500.0000 mg | ORAL_TABLET | Freq: Four times a day (QID) | ORAL | Status: DC | PRN
  Administered 2021-09-01 – 2021-09-03 (×2): 500 mg via ORAL
  Filled 2021-08-31 (×2): qty 1

## 2021-08-31 MED ORDER — BUPIVACAINE LIPOSOME 1.3 % IJ SUSP
INTRAMUSCULAR | Status: DC | PRN
  Administered 2021-08-31: 20 mL

## 2021-08-31 MED ORDER — ONDANSETRON HCL 4 MG PO TABS: 4.0000 mg | ORAL_TABLET | Freq: Four times a day (QID) | ORAL | Status: DC | PRN

## 2021-08-31 MED ORDER — ONDANSETRON HCL 4 MG/2ML IJ SOLN
4.0000 mg | Freq: Once | INTRAMUSCULAR | Status: AC
Start: 2021-08-31 — End: 2021-08-31
  Administered 2021-08-31: 4 mg via INTRAVENOUS
  Filled 2021-08-31: qty 2

## 2021-08-31 MED ORDER — ATORVASTATIN CALCIUM 10 MG PO TABS
10.0000 mg | ORAL_TABLET | Freq: Every day | ORAL | Status: DC
  Administered 2021-08-31 – 2021-09-09 (×10): 10 mg via ORAL
  Filled 2021-08-31 (×10): qty 1

## 2021-08-31 MED ORDER — ADULT MULTIVITAMIN W/MINERALS CH
1.0000 | ORAL_TABLET | Freq: Every day | ORAL | Status: DC
  Administered 2021-09-01 – 2021-09-09 (×9): 1 via ORAL
  Filled 2021-08-31 (×9): qty 1

## 2021-08-31 MED ORDER — MORPHINE SULFATE (PF) 2 MG/ML IV SOLN
2.0000 mg | Freq: Once | INTRAVENOUS | Status: AC
  Administered 2021-08-31: 2 mg via INTRAVENOUS
  Filled 2021-08-31: qty 1

## 2021-08-31 MED ORDER — PROPOFOL 1000 MG/100ML IV EMUL
INTRAVENOUS | Status: AC
  Filled 2021-08-31: qty 100

## 2021-08-31 MED ORDER — FENTANYL CITRATE (PF) 100 MCG/2ML IJ SOLN
INTRAMUSCULAR | Status: DC | PRN
  Administered 2021-08-31: 50 ug via INTRAVENOUS

## 2021-08-31 MED ORDER — SUGAMMADEX SODIUM 200 MG/2ML IV SOLN
INTRAVENOUS | Status: DC | PRN
  Administered 2021-08-31: 120 mg via INTRAVENOUS

## 2021-08-31 MED ORDER — ACETAMINOPHEN 325 MG PO TABS
325.0000 mg | ORAL_TABLET | Freq: Four times a day (QID) | ORAL | Status: DC | PRN
  Administered 2021-09-07 – 2021-09-09 (×2): 650 mg via ORAL
  Filled 2021-08-31 (×3): qty 2

## 2021-08-31 MED ORDER — ACETAMINOPHEN 500 MG PO TABS
1000.0000 mg | ORAL_TABLET | Freq: Three times a day (TID) | ORAL | Status: DC
  Administered 2021-08-31 – 2021-09-08 (×23): 1000 mg via ORAL
  Filled 2021-08-31 (×24): qty 2

## 2021-08-31 MED ORDER — FLEET ENEMA 7-19 GM/118ML RE ENEM
1.0000 | ENEMA | Freq: Once | RECTAL | Status: DC | PRN
Start: 2021-08-31 — End: 2021-09-09

## 2021-08-31 MED ORDER — EPHEDRINE 5 MG/ML INJ
INTRAVENOUS | Status: AC
  Filled 2021-08-31: qty 5

## 2021-08-31 MED ORDER — MAGNESIUM HYDROXIDE 400 MG/5ML PO SUSP: 30.0000 mL | Freq: Every day | ORAL | Status: DC | PRN

## 2021-08-31 MED ORDER — ACETAMINOPHEN 10 MG/ML IV SOLN
INTRAVENOUS | Status: DC | PRN
  Administered 2021-08-31: 1000 mg via INTRAVENOUS

## 2021-08-31 MED ORDER — VITAMIN B-12 1000 MCG PO TABS
1000.0000 ug | ORAL_TABLET | Freq: Every day | ORAL | Status: DC
  Administered 2021-08-31 – 2021-09-09 (×10): 1000 ug via ORAL
  Filled 2021-08-31 (×10): qty 1

## 2021-08-31 MED ORDER — CEFAZOLIN SODIUM-DEXTROSE 2-4 GM/100ML-% IV SOLN
2.0000 g | Freq: Once | INTRAVENOUS | Status: AC
  Administered 2021-08-31: 2 g via INTRAVENOUS

## 2021-08-31 SURGICAL SUPPLY — 49 items
BIT DRILL INTERTAN LAG SCREW (BIT) ×1 IMPLANT
BIT DRILL LONG 4.0 (BIT) IMPLANT
BLADE SURG 15 STRL LF DISP TIS (BLADE) ×1 IMPLANT
BLADE SURG 15 STRL SS (BLADE) ×2
CHLORAPREP W/TINT 26 (MISCELLANEOUS) ×2 IMPLANT
DRAPE 3/4 80X56 (DRAPES) ×2 IMPLANT
DRAPE SURG 17X11 SM STRL (DRAPES) ×4 IMPLANT
DRAPE U-SHAPE 47X51 STRL (DRAPES) ×4 IMPLANT
DRILL BIT LONG 4.0 (BIT) ×2
DRSG OPSITE POSTOP 3X4 (GAUZE/BANDAGES/DRESSINGS) ×4 IMPLANT
DRSG OPSITE POSTOP 4X6 (GAUZE/BANDAGES/DRESSINGS) ×1 IMPLANT
ELECT REM PT RETURN 9FT ADLT (ELECTROSURGICAL) ×2
ELECTRODE REM PT RTRN 9FT ADLT (ELECTROSURGICAL) ×1 IMPLANT
GLOVE BIOGEL PI IND STRL 8 (GLOVE) ×1 IMPLANT
GLOVE BIOGEL PI INDICATOR 8 (GLOVE) ×2
GLOVE SURG SYN 7.5  E (GLOVE) ×2
GLOVE SURG SYN 7.5 E (GLOVE) ×1 IMPLANT
GLOVE SURG SYN 7.5 PF PI (GLOVE) ×1 IMPLANT
GOWN STRL REUS W/ TWL LRG LVL3 (GOWN DISPOSABLE) ×1 IMPLANT
GOWN STRL REUS W/ TWL XL LVL3 (GOWN DISPOSABLE) ×1 IMPLANT
GOWN STRL REUS W/TWL LRG LVL3 (GOWN DISPOSABLE) ×2
GOWN STRL REUS W/TWL XL LVL3 (GOWN DISPOSABLE) ×2
GUIDE PIN 3.2X343 (PIN) ×2
GUIDE PIN 3.2X343MM (PIN) ×4
GUIDE ROD 3.0 (MISCELLANEOUS) ×2
KIT PATIENT CARE HANA TABLE (KITS) ×1 IMPLANT
KIT TURNOVER KIT A (KITS) ×2 IMPLANT
MANIFOLD NEPTUNE II (INSTRUMENTS) ×2 IMPLANT
MAT ABSORB  FLUID 56X50 GRAY (MISCELLANEOUS) ×4
MAT ABSORB FLUID 56X50 GRAY (MISCELLANEOUS) ×2 IMPLANT
NAIL TRIGEN INTERTAN 10X18CM (Nail) ×1 IMPLANT
NDL FILTER BLUNT 18X1 1/2 (NEEDLE) ×1 IMPLANT
NEEDLE FILTER BLUNT 18X 1/2SAF (NEEDLE) ×2
NEEDLE FILTER BLUNT 18X1 1/2 (NEEDLE) ×1 IMPLANT
NEEDLE HYPO 22GX1.5 SAFETY (NEEDLE) ×2 IMPLANT
NS IRRIG 1000ML POUR BTL (IV SOLUTION) ×2 IMPLANT
PACK HIP COMPR (MISCELLANEOUS) ×2 IMPLANT
PIN GUIDE 3.2X343MM (PIN) IMPLANT
ROD GUIDE 3.0 (MISCELLANEOUS) IMPLANT
SCREW LAG COMPR KIT 85/80 (Screw) ×1 IMPLANT
SCREW TRIGEN LOW PROF 5.0X30 (Screw) ×1 IMPLANT
STAPLER SKIN PROX 35W (STAPLE) ×2 IMPLANT
SUT VIC AB 2-0 CT2 27 (SUTURE) ×2 IMPLANT
SYR 10ML LL (SYRINGE) ×2 IMPLANT
SYR 30ML LL (SYRINGE) ×2 IMPLANT
TAPE CLOTH 3X10 WHT NS LF (GAUZE/BANDAGES/DRESSINGS) ×3 IMPLANT
TRAP FLUID SMOKE EVACUATOR (MISCELLANEOUS) ×2 IMPLANT
TRAY FOLEY MTR SLVR 16FR STAT (SET/KITS/TRAYS/PACK) ×1 IMPLANT
WATER STERILE IRR 500ML POUR (IV SOLUTION) ×2 IMPLANT

## 2021-08-31 NOTE — H&P (Signed)
H&P to be entered shortly by primary team. See consult note for orthopedic H&P

## 2021-08-31 NOTE — Op Note (Signed)
DATE OF SURGERY: 08/31/2021  PREOPERATIVE DIAGNOSIS: Left intertrochanteric hip fracture  POSTOPERATIVE DIAGNOSIS: Left intertrochanteric hip fracture  PROCEDURE: Intramedullary nailing of Left femur with cephalomedullary device  SURGEON: Rosealee Albee, MD  ANESTHESIA: Gen  EBL: 250 cc  IVF: per anesthesia record  COMPONENTS:  [employer] Trigen Intertan Short Nail: 10x177mm; 10mm lag screw with 61mm compression screw; 5x 65mm distal cortical interlocking screw  INDICATIONS: Dana Love is a 88 y.o. female who sustained an intertrochanteric fracture after a fall. Risks and benefits of intramedullary nailing were explained to the patient and/or family. Risks include but are not limited to bleeding, infection, injury to tissues, nerves, vessels, nonunion/malunion, hardware failure, limb length discrepancy/hip rotation mismatch and risks of anesthesia. The patient and/or family understand these risks, have completed an informed consent, and wish to proceed.   PROCEDURE:  The patient was brought into the operating room. After administering anesthesia, the patient was placed in the supine position on the Hana table. The uninjured leg was placed in an extended position while the injured lower extremity was placed in longitudinal traction. The fracture was reduced using longitudinal traction and internal rotation. The adequacy of reduction was verified fluoroscopically in AP and lateral projections and found to be acceptable. The lateral aspect of the hip and thigh were prepped with ChloraPrep solution before being draped sterilely. Preoperative IV antibiotics were administered. A timeout was performed to verify the appropriate surgical site, patient, and procedure.    The greater trochanter was identified and an approximately 5cm incision was made about 3 fingerbreadths above the tip of the greater trochanter. The incision was carried down through the subcutaneous tissues to expose the  gluteal fascia. This was split the length of the incision, providing access to the tip of the trochanter. Under fluoroscopic guidance, a guidewire was drilled through the medial tip of the trochanter into the proximal metaphysis to the level of the lesser trochanter. After verifying its position fluoroscopically in AP and lateral projections, it was overreamed with the opening reamer to the level of the lesser trochanter. The nail was selected and advanced to the appropriate depth as verified fluoroscopically.    The guide system for the lag screw was positioned and advanced through an approximately 5cm incision over the lateral aspect of the proximal femur. The guidewire was drilled up through the femoral nail and into the femoral neck to rest within 5 mm of subchondral bone. After verifying its position in the femoral neck and head in both AP and lateral projections, the guidewire was measured and appropriate sized lag screw was selected.  The channel for the compression screw was drilled and antirotation bar was placed.  Lag screw was drilled and placed in appropriate position.  Compression screw was then placed.  Appropriate compression was achieved.  The set screw was locked in place. Again, the adequacy of hardware position and fracture reduction was verified fluoroscopically in AP and lateral projections.   Attention was then turned to the distal interlocking screw in the diaphysis. Using a targeted assembly, a stab incision was made and hole was drilled through the nail. An interlocking screw was placed with excellent purchase.  Appropriate screw position was verified fluoroscopically in AP and lateral projections.   The wounds were irrigated thoroughly with sterile saline solution. Local anesthetic was injected into the wounds. Deep fascia was closed with 0-Vicryl. The subcutaneous tissues were closed using 2-0 Vicryl interrupted sutures. The skin was closed using staples. Sterile occlusive dressings  were  applied to all wounds. The patient was then transferred to the recovery room in satisfactory condition.   POSTOPERATIVE PLAN: The patient will be WBAT on the operative extremity. Lovenox 40mg /day x 4 weeks to start on POD#1. Perioperative IV antibiotics x 24 hours. PT/OT on POD#1.

## 2021-08-31 NOTE — ED Triage Notes (Signed)
Pt BIB [facility] from [facility] with reports of unwitnessed fall. Pt C/O left hip pain. No shortening or rotation noted.

## 2021-08-31 NOTE — Transfer of Care (Signed)
Immediate Anesthesia Transfer of Care Note  Patient: Dana Love  Procedure(s) Performed: INTRAMEDULLARY (IM) NAIL INTERTROCHANTERIC (Left)  Patient Location: PACU  Anesthesia Type:General  Level of Consciousness: awake, drowsy and patient cooperative  Airway & Oxygen Therapy: Patient Spontanous Breathing and Patient connected to face mask oxygen  Post-op Assessment: Report given to RN and Post -op Vital signs reviewed and stable  Post vital signs: Reviewed and stable  Last Vitals:  Vitals Value Taken Time  BP 118/50 08/31/21 1449  Temp    Pulse 85 08/31/21 1452  Resp 18 08/31/21 1452  SpO2 99 % 08/31/21 1452  Vitals shown include unvalidated device data.  Last Pain:  Vitals:   08/31/21 1209  TempSrc: Temporal         Complications: No notable events documented.

## 2021-08-31 NOTE — Anesthesia Preprocedure Evaluation (Signed)
Anesthesia Evaluation  Patient identified by MRN, date of birth, ID band Patient awake    Reviewed: Allergy & Precautions, NPO status , Patient's Chart, lab work & pertinent test results  History of Anesthesia Complications Negative for: history of anesthetic complications  Airway Mallampati: Unable to assess  TM Distance: >3 FB Neck ROM: full    Dental  (+) Dental Advidsory Given   Pulmonary neg pulmonary ROS, neg shortness of breath, neg COPD,    Pulmonary exam normal        Cardiovascular (-) hypertension(-) angina(-) Past MI negative cardio ROS Normal cardiovascular exam(-) pacemaker     Neuro/Psych PSYCHIATRIC DISORDERS Dementia negative neurological ROS  negative psych ROS   GI/Hepatic negative GI ROS, Neg liver ROS,   Endo/Other  negative endocrine ROS  Renal/GU      Musculoskeletal   Abdominal   Peds  Hematology negative hematology ROS (+)   Anesthesia Other Findings Past Medical History: No date: Alzheimer disease (HCC) No date: High cholesterol  Past Surgical History: 09/09/2019: HIP PINNING,CANNULATED; Right     Comment:  Procedure: CANNULATED HIP PINNING;  Surgeon: Juanell Fairly, MD;  Location: [facility] ORS;  Service: Orthopedics;                Laterality: Right;  BMI    Body Mass Index: 26.46 kg/m      Reproductive/Obstetrics negative OB ROS                             Anesthesia Physical Anesthesia Plan  ASA: 3  Anesthesia Plan: General ETT   Post-op Pain Management:    Induction: Intravenous  PONV Risk Score and Plan: Ondansetron and Dexamethasone  Airway Management Planned: Oral ETT  Additional Equipment:   Intra-op Plan:   Post-operative Plan: Extubation in OR  Informed Consent: I have reviewed the patients History and Physical, chart, labs and discussed the procedure including the risks, benefits and alternatives for the proposed  anesthesia with the patient or authorized representative who has indicated his/her understanding and acceptance.     Dental Advisory Given  Plan Discussed with: Anesthesiologist, CRNA and Surgeon  Anesthesia Plan Comments: (Patient consented for risks of anesthesia including but not limited to:  - adverse reactions to medications - damage to eyes, teeth, lips or other oral mucosa - nerve damage due to positioning  - sore throat or hoarseness - Damage to heart, brain, nerves, lungs, other parts of body or loss of life  Patient voiced understanding.)        Anesthesia Quick Evaluation

## 2021-08-31 NOTE — Anesthesia Procedure Notes (Signed)
Procedure Name: Intubation Date/Time: 08/31/2021 1:07 PM  Performed by: Elmarie Mainland, CRNAPre-anesthesia Checklist: Patient identified, Emergency Drugs available, Suction available and Patient being monitored Patient Re-evaluated:Patient Re-evaluated prior to induction Oxygen Delivery Method: Circle system utilized Preoxygenation: Pre-oxygenation with 100% oxygen Induction Type: IV induction Ventilation: Mask ventilation without difficulty Tube type: Oral Tube size: 6.5 mm Number of attempts: 1 Airway Equipment and Method: Stylet and Oral airway Placement Confirmation: ETT inserted through vocal cords under direct vision, positive ETCO2 and breath sounds checked- equal and bilateral Secured at: 20 cm Tube secured with: Tape Dental Injury: Teeth and Oropharynx as per pre-operative assessment

## 2021-08-31 NOTE — ED Provider Notes (Addendum)
[facility] Provider Note    Event Date/Time   First MD Initiated Contact with Patient 08/31/21 [phone]     (approximate)   History   Fall   HPI  Dana Love is a 88 y.o. female from [place] with an unwitnessed fall.  She complains of pain in her left hip and numbness and tingling in the left leg and low back.  She is not sure what she was doing at the nursing home or why she was there and she is not sure why she is here either. Review of her old records shows she has cognitive impairment and postmenopausal osteoporosis history of hip fracture in 2021 so not sure which hip.  She had a DEXA scan in 2022 showing osteoporosis in the left hip.   Physical Exam   Triage Vital Signs: ED Triage Vitals  Enc Vitals Group     BP      Pulse      Resp      Temp      Temp src      SpO2      Weight      Height      Head Circumference      Peak Flow      Pain Score      Pain Loc      Pain Edu?      Excl. in GC?     Most recent vital signs: Vitals:   08/31/21 0816  BP: (!) 171/77  Pulse: (!) 59  Resp: 17  Temp: 98.2 F (36.8 C)  SpO2: 97%    { General: Awake, no distress.  Head normocephalic atraumatic Neck nontender to palpation Upper back nontender to palpation Lower back tender to palpation of approximately L3-4 area. CV:  Good peripheral perfusion.  Heart regular rate and rhythm no audible murmurs Resp:  Normal effort.  Chest is nontender Abd:  No distention.  Soft and nontender Patient with good strength throughout left leg is tingly she reports with pain in the left hip as well as the back as I mention.  This tingling is apparently new.   ED Results / Procedures / Treatments   Labs (all labs ordered are listed, but only abnormal results are displayed) Labs Reviewed  COMPREHENSIVE METABOLIC PANEL  CBC WITH DIFFERENTIAL/PLATELET  PROTIME-INR  APTT  BLOOD GAS, VENOUS  TYPE AND SCREEN     EKG  EKG read interpreted by me  shows normal sinus rhythm rate of 66 normal axis flipped T waves in the precordial leads which were present in 2021 but are worse now.  Patient has no chest pain or shortness of breath.   RADIOLOGY CT of the head and neck showed no acute processes.  CT of the L-spine also shows no acute process in the L-spine or there are sacral insufficiency fractures in the sacrum that are thought to be old but the patient did fall today as well.  There could be some new things in there possibly.  Patient's hip x-ray shows an intertrochanteric fracture.  I discussed this with Dr. Allena Katz who will try to do the surgery today I reviewed and interpreted all the films after the radiologist read them.   PROCEDURES:  Critical Care performed: Critical care performed about 20 minutes.  This includes speaking with the patient's son twice and speaking with Dr. Allena Katz and then I am speaking with the hospitalist.  Additionally I reviewed her old records notify Dr. Allena Katz of  the previous right-sided hip fracture.  Procedures   MEDICATIONS ORDERED IN ED: Medications  morphine (PF) 2 MG/ML injection 2 mg (has no administration in time range)  ondansetron (ZOFRAN) injection 4 mg (has no administration in time range)     IMPRESSION / MDM / ASSESSMENT AND PLAN / ED COURSE  I reviewed the triage vital signs and the nursing notes. Discussed patient with Dr. Allena Katz and with the patient's son who has power of attorney.  Patient is demented she walks with a walker.  Will try to get the patient surgery today to repair the intertrochanteric hip fracture. Differential diagnosis includes, but is not limited to, hip fracture.  Patient's presentation is most consistent with acute presentation with potential threat to life or bodily function.  The patient is on the cardiac monitor to evaluate for evidence of arrhythmia and/or significant heart rate changes.  None have been seen.   FINAL CLINICAL IMPRESSION(S) / ED DIAGNOSES   Final  diagnoses:  Fall, initial encounter  Closed left hip fracture, initial encounter [facility])     Rx / DC Orders   ED Discharge Orders     None        Note:  This document was prepared using Dragon voice recognition software and may include unintentional dictation errors.   Arnaldo Natal, MD 08/31/21 1017    Arnaldo Natal, MD 08/31/21 1104    Arnaldo Natal, MD 08/31/21 1105

## 2021-08-31 NOTE — Progress Notes (Signed)
Pts temp and rr added in during review

## 2021-08-31 NOTE — Progress Notes (Addendum)
Initial Nutrition Assessment  DOCUMENTATION CODES:   Not applicable  INTERVENTION:   -Ensure Enlive po BID, each supplement provides 350 kcal and 20 grams of protein -MVI with minerals daily  NUTRITION DIAGNOSIS:   Increased nutrient needs related to post-op healing as evidenced by estimated needs.  GOAL:   Patient will meet greater than or equal to 90% of their needs  MONITOR:   PO intake, Supplement acceptance, Diet advancement  REASON FOR ASSESSMENT:   Consult Assessment of nutrition requirement/status, Hip fracture protocol  ASSESSMENT:   Pt with a history of significant dementia who had a fall earlier today.  The patient noted immediate hip pain and inability to ambulate.  The patient ambulates with a walker at baseline. Pain is worse with any sort of movement.  X-rays in the emergency department show a left intertrochanteric hip fracture.  CT scan of the lumbar spine also shows chronic compression fracture at L1 as well as likely subacute bilateral sacral insufficiency fractures.  Pt admitted with lt hip fracture.   Reviewed I/O's: -450 ml x 24 hours   UOP: 400 ml x 24 hours  Per orthopedics notes, plan for OR today. Pt currently NPO for procedure.   Spoke with pt and son at bedside. Pt reports she is hungry and assisted her with ordering dinner meal. Per pt son, pt likes at [facility] and has a good appetite. She eats well "as long as the food is good".   Pt denies any weight loss. Per son, she has gained weight over the past 2 years living at ALF. Reviewed wt hx; no wt loss noted over the past 18 months.   Discussed importance of good meal and supplement intake to promote healing.    Medications reviewed and include senokot.   Labs reviewed.    Nutrition-Focused Physical Exam  Flowsheet Row Most Recent Value  Orbital Region Mild depletion  Upper Arm Region Mild depletion  Thoracic and Lumbar Region No depletion  Buccal Region No depletion  Temple  Region No depletion  Clavicle Bone Region No depletion  Clavicle and Acromion Bone Region No depletion  Scapular Bone Region No depletion  Dorsal Hand Mild depletion  Patellar Region Mild depletion  Anterior Thigh Region Mild depletion  Posterior Calf Region Mild depletion  Edema (RD Assessment) None  Hair Reviewed  Eyes Reviewed  Mouth Reviewed  Skin Reviewed  Nails Reviewed       Diet Order:   Diet Order             Diet regular Room service appropriate? Yes; Fluid consistency: Thin  Diet effective now                   EDUCATION NEEDS:   No education needs have been identified at this time  Skin:  Skin Assessment: Skin Integrity Issues: Skin Integrity Issues:: Incisions Incisions: closed lt hip  Last BM:  Unknown  Height:   Ht Readings from Last 1 Encounters:  08/31/21 4\' 11"  (1.499 m)    Weight:   Wt Readings from Last 1 Encounters:  08/31/21 59.4 kg    Ideal Body Weight:  44.7 kg  BMI:  Body mass index is 26.46 kg/m.  Estimated Nutritional Needs:   Kcal:  1500-1700  Protein:  80-95 grams  Fluid:  > 1.5 L    10/31/21, RD, LDN, CDCES Registered Dietitian II Certified Diabetes Care and Education Specialist Please refer to [facility] for RD and/or RD on-call/weekend/after hours pager

## 2021-08-31 NOTE — Consult Note (Signed)
ORTHOPAEDIC CONSULTATION  REQUESTING PHYSICIAN: Arnaldo Natal, MD  Chief Complaint:   L hip pain  History of Present Illness: History obtained from discussion with patient's son who is medical power of attorney, and from emergency department staff.  Dana Love is a 88 y.o. female with a history of significant dementia who had a fall earlier today.  The patient noted immediate hip pain and inability to ambulate.  The patient ambulates with a walker at baseline.  The patient lives at [facility].  Pain is worse with any sort of movement.  X-rays in the emergency department show a left intertrochanteric hip fracture.  CT scan of the lumbar spine also shows chronic compression fracture at L1 as well as likely subacute bilateral sacral insufficiency fractures.  Of note, she has had a prior right hip percutaneous pinning performed by Dr. Martha Clan in August 2021.    Past Medical History:  Diagnosis Date   Alzheimer disease (HCC)    High cholesterol    Past Surgical History:  Procedure Laterality Date   HIP PINNING,CANNULATED Right 09/09/2019   Procedure: CANNULATED HIP PINNING;  Surgeon: Juanell Fairly, MD;  Location: [facility] ORS;  Service: Orthopedics;  Laterality: Right;   Social History   Socioeconomic History   Marital status: Widowed    Spouse name: Not on file   Number of children: Not on file   Years of education: Not on file   Highest education level: Not on file  Occupational History   Not on file  Tobacco Use   Smoking status: Never   Smokeless tobacco: Never  Substance and Sexual Activity   Alcohol use: Never   Drug use: Never   Sexual activity: Not on file  Other Topics Concern   Not on file  Social History Narrative   Not on file   Social Determinants of Health   Financial Resource Strain: Not on file  Food Insecurity: Not on file  Transportation Needs: Not on file  Physical Activity: Not  on file  Stress: Not on file  Social Connections: Not on file   No family history on file. No Known Allergies Prior to Admission medications   Medication Sig Start Date End Date Taking? Authorizing Provider  ascorbic acid (VITAMIN C) 500 MG tablet Take 500 mg by mouth 2 (two) times daily.    [provider]  atorvastatin (LIPITOR) 10 MG tablet Take 10 mg by mouth daily.    [provider]  Cholecalciferol (VITAMIN D) 50 MCG (2000 UT) tablet Take 2,000 Units by mouth daily.    [provider]  cyanocobalamin 1000 MCG tablet Take 1,000 mcg by mouth daily.     [provider]  donepezil (ARICEPT) 10 MG tablet Take 10 mg by mouth at bedtime.    [provider]  enoxaparin (LOVENOX) 40 MG/0.4ML injection Inject 0.4 mLs (40 mg total) into the skin daily for 14 days. 09/14/19 09/28/19  Lurene Shadow, MD  HYDROcodone-acetaminophen (NORCO/VICODIN) 5-325 MG tablet Take 1 tablet by mouth every 8 (eight) hours as needed for moderate pain (pain score 4-6). 09/13/19   Lurene Shadow, MD  polyethylene glycol (MIRALAX / GLYCOLAX) 17 g packet Take 17 g by mouth daily as needed for mild constipation. 09/13/19   Lurene Shadow, MD  raloxifene (EVISTA) 60 MG tablet Take 60 mg by mouth daily.    [provider]  sertraline (ZOLOFT) 100 MG tablet Take 100 mg by mouth at bedtime.     [provider]  Recent Labs    08/31/21 1030  WBC 10.5  HGB 12.7  HCT 39.7  PLT 134*  K 4.1  CL 107  CO2 27  BUN 13  CREATININE 0.84  GLUCOSE 109*  CALCIUM 8.5*  INR 1.0   CT Lumbar Spine Wo Contrast  Result Date: 08/31/2021 CLINICAL DATA:  Lumbar radiculopathy, trauma Low back pain and some complaints of left leg tingling with hip pain after fall EXAM: CT LUMBAR SPINE WITHOUT CONTRAST TECHNIQUE: Multidetector CT imaging of the lumbar spine was performed without intravenous contrast administration. Multiplanar CT image reconstructions were also generated.  RADIATION DOSE REDUCTION: This exam was performed according to the departmental dose-optimization program which includes automated exposure control, adjustment of the mA and/or kV according to patient size and/or use of iterative reconstruction technique. COMPARISON:  None Available. FINDINGS: Segmentation: 5 lumbar type vertebrae. Alignment: Dextroconvex lumbar curvature. Mild retrolisthesis at L2-L3. Grade analysis at L4-L5 and trace anterolisthesis at L5-S1. Vertebrae: There is mild superior endplate depression on the right at L1, unchanged in comparison to prior radiograph in December 2020. No evidence of acute lumbar spine fracture. There are bilateral sacral fractures with mixed sclerosis in zones 1 through 3 bilaterally. There is bony productive change anteriorly at S2. Persistent residual fracture lines are visible. Paraspinal and other soft tissues: Aortoiliac atherosclerosis. Simple right renal cyst which requires no follow-up imaging. Mild paraspinal muscle atrophy. Disc levels: L1-L2: No significant spinal canal or neural foraminal narrowing. L2-L3: Trace retrolisthesis with broad-based disc bulging, ligament flavum hypertrophy and mild facet arthropathy. There is mild spinal canal and right neural foraminal stenosis. L3-L4: Broad-based disc bulging, ligamentum hypertrophy and mild facet arthropathy. Mild spinal canal stenosis. No significant neural foraminal stenosis. L4-L5: There is severe disc height loss with trace degenerative anterolisthesis, broad-based disc bulging, ligamentum flavum hypertrophy and mild facet arthropathy. There is mild to moderate spinal canal stenosis, severe left and no significant right neural foraminal stenosis. L5-S1: Disc height loss with broad-based disc bulging, ligamentum flavum hypertrophy and mild facet arthropathy. No significant spinal canal stenosis. Moderate bilateral neural foraminal stenosis. IMPRESSION: Bilateral sacral insufficiency fractures, with mixed  sclerosis throughout the visible sacrum, favoring these to be subacute-chronic. MRI could determine acuity if clinically indicated. No evidence of acute lumbar spine fracture. Mild right-sided superior endplate depression of L1, unchanged in comparison to prior radiograph in December 2020. Multilevel degenerative changes of the lumbar spine, most prominent at L4-L5 and L5-S1. Severe left neural foraminal stenosis and mild-to-moderate spinal canal stenosis at L4-L5. Moderate bilateral neural foraminal stenosis at L5-S1. Additional mild degenerative changes at L2-L3 and L3-L4 as described above. Electronically Signed   By: Maurine Simmering M.D.   On: 08/31/2021 09:38   CT Head Wo Contrast  Result Date: 08/31/2021 CLINICAL DATA:  Head trauma, minor (Age >= 65y); Neck trauma (Age >= 65y) EXAM: CT HEAD WITHOUT CONTRAST CT CERVICAL SPINE WITHOUT CONTRAST TECHNIQUE: Multidetector CT imaging of the head and cervical spine was performed following the standard protocol without intravenous contrast. Multiplanar CT image reconstructions of the cervical spine were also generated. RADIATION DOSE REDUCTION: This exam was performed according to the departmental dose-optimization program which includes automated exposure control, adjustment of the mA and/or kV according to patient size and/or use of iterative reconstruction technique. COMPARISON:  CT head cervical spine 02/28/2020 FINDINGS: CT HEAD FINDINGS Brain: No evidence of acute intracranial hemorrhage or extra-axial collection.Patent basal cisterns.The ventricles are unchanged in size, out of proportion to degree of cerebral atrophy, could reflect normal pressure  hydrocephalus in the appropriate clinical setting.Scattered subcortical and periventricular white matter hypodensities, nonspecific but likely sequela of chronic small vessel ischemic disease.Mild cerebral atrophy Vascular: No hyperdense vessel. Skull: Negative for skull fracture. Sinuses/Orbits: Minimal ethmoid air  cell mucosal thickening. Orbits are unremarkable. Other: None. CT CERVICAL SPINE FINDINGS Alignment: Degenerative grade 1 listhesis at C3-C4, C4-C5, C6-C7, and C7-T1. Skull base and vertebrae: No evidence of acute cervical spine fracture. No aggressive osseous lesion. Soft tissues and spinal canal: No prevertebral fluid or swelling. No visible canal hematoma. Disc levels: There is multilevel degenerative disc disease, most prominent from C4 through C7, moderate-severe at C5-C6. There is moderate to severe multilevel facet arthropathy with bony fusion bilaterally at C4-C5. Upper chest: Focal soft tissue swelling in the left lower neck/supraclavicular region (series 3, image 62). No focal fluid collection. Lung apices are clear. Other: None IMPRESSION: No acute intracranial abnormality.  Stable head CT. No acute cervical spine fracture. Multilevel degenerative disc disease and facet arthropathy. Focal soft tissue swelling in the left lower neck/supraclavicular region, could reflect a small contusion. Electronically Signed   By: Caprice Renshaw M.D.   On: 08/31/2021 09:28   CT Cervical Spine Wo Contrast  Result Date: 08/31/2021 CLINICAL DATA:  Head trauma, minor (Age >= 65y); Neck trauma (Age >= 65y) EXAM: CT HEAD WITHOUT CONTRAST CT CERVICAL SPINE WITHOUT CONTRAST TECHNIQUE: Multidetector CT imaging of the head and cervical spine was performed following the standard protocol without intravenous contrast. Multiplanar CT image reconstructions of the cervical spine were also generated. RADIATION DOSE REDUCTION: This exam was performed according to the departmental dose-optimization program which includes automated exposure control, adjustment of the mA and/or kV according to patient size and/or use of iterative reconstruction technique. COMPARISON:  CT head cervical spine 02/28/2020 FINDINGS: CT HEAD FINDINGS Brain: No evidence of acute intracranial hemorrhage or extra-axial collection.Patent basal cisterns.The  ventricles are unchanged in size, out of proportion to degree of cerebral atrophy, could reflect normal pressure hydrocephalus in the appropriate clinical setting.Scattered subcortical and periventricular white matter hypodensities, nonspecific but likely sequela of chronic small vessel ischemic disease.Mild cerebral atrophy Vascular: No hyperdense vessel. Skull: Negative for skull fracture. Sinuses/Orbits: Minimal ethmoid air cell mucosal thickening. Orbits are unremarkable. Other: None. CT CERVICAL SPINE FINDINGS Alignment: Degenerative grade 1 listhesis at C3-C4, C4-C5, C6-C7, and C7-T1. Skull base and vertebrae: No evidence of acute cervical spine fracture. No aggressive osseous lesion. Soft tissues and spinal canal: No prevertebral fluid or swelling. No visible canal hematoma. Disc levels: There is multilevel degenerative disc disease, most prominent from C4 through C7, moderate-severe at C5-C6. There is moderate to severe multilevel facet arthropathy with bony fusion bilaterally at C4-C5. Upper chest: Focal soft tissue swelling in the left lower neck/supraclavicular region (series 3, image 62). No focal fluid collection. Lung apices are clear. Other: None IMPRESSION: No acute intracranial abnormality.  Stable head CT. No acute cervical spine fracture. Multilevel degenerative disc disease and facet arthropathy. Focal soft tissue swelling in the left lower neck/supraclavicular region, could reflect a small contusion. Electronically Signed   By: Caprice Renshaw M.D.   On: 08/31/2021 09:28   DG Hip Unilat With Pelvis 2-3 Views Left  Result Date: 08/31/2021 CLINICAL DATA:  Fall. EXAM: DG HIP (WITH OR WITHOUT PELVIS) 2-3V LEFT COMPARISON:  February 28, 2020. FINDINGS: Acute left intertrochanteric femur fracture with apex anterior angulation, but no significant displacement. Healed right subcapital femoral neck fracture status post screw fixation. No dislocation. Unchanged mild right hip axial joint space narrowing.  The left hip joint space is preserved. The pubic symphysis and sacroiliac joints are intact. Osteopenia. Soft tissues are unremarkable. IMPRESSION: 1. Acute left intertrochanteric femur fracture. Electronically Signed   By: Titus Dubin M.D.   On: 08/31/2021 09:03     Positive ROS: All other systems have been reviewed and were otherwise negative with the exception of those mentioned in the HPI and as above.  Physical Exam: BP (!) 171/77   Pulse (!) 59   Temp 98.2 F (36.8 C) (Oral)   Resp 17   SpO2 97%  General:  Alert, no acute distress Psychiatric:  Patient is NOT competent for consent, non-agitated Cardiovascular:  No pedal edema, regular rate and rhythm Respiratory:  No wheezing, non-labored breathing  Orthopedic Exam:  LLE: + DF/PF/EHL SILT grossly over foot Foot wwp +Log roll/axial load   Imaging:  As above: L intertrochanteric hip fracture. Healed right valgus impacted femoral neck fracture affixed with 3 percutaneous cannulated screws.  Significant degenerative changes to the lumbar spine with subacute-chronic fractures of L1 and bilateral sacral ala  Assessment/Plan: Dana Love is a 88 y.o. female with a L intertrochanteric hip fracture   1. I discussed the various treatment options including both surgical and non-surgical management of the fracture with the patient and/or family (medical PoA). We discussed the high risk of perioperative complications due to patient's age, dementia, and other co-morbidities. After discussion of risks, benefits, and alternatives to surgery, the family and/or patient were in agreement to proceed with surgery. The goals of surgery would be to provide adequate pain relief and allow for mobilization. Plan for surgery is L hip cephalomedullary nailing today, 08/31/2021. 2. NPO until OR 3. Hold anticoagulation in advance of OR      Leim Fabry   08/31/2021 10:00 AM

## 2021-08-31 NOTE — H&P (Signed)
History and Physical    Patient: Dana Love EZM:[id number] DOB: 1932/06/16 DOA: 08/31/2021 DOS: the patient was seen and examined on 09/01/2021 PCP: Barbette Reichmann, MD  Patient coming from:  [facility]  Chief Complaint:  Chief Complaint  Patient presents with   Fall   HPI: Dana Love is a 88 y.o. female with medical history significant of dementia, osteoporosis, hyperlipidemia, right hip fracture in 2021 presented to the ED on 08/31/2021 after having an unwitnessed fall and subsequent pain in her left hip and numbness and tingling in the left leg and lower back.  Due to baseline dementia, patient was unable to provide history or details of any symptoms prior to the fall. When seen in the ED on admission, patient denied having any pain at the time.  She did not know that she is in the hospital or why she is here.  Son was at bedside and confirms this to be her cognitive baseline.  ED course --BP elevated 171/77, heart rate 59 otherwise normal vital signs. CMP notable only for glucose of 109, calcium 8.5. CBC notable only for mildly low platelets 134. Imaging --unremarkable CT head and CT cervical spine.  CT lumbar spine showed likely subacute or chronic bilateral sacral insufficiency fractures and chronic degenerative changes. Left hip x-ray showed an acute intertrochanteric fracture.  Orthopedic surgery was consulted and took patient to the OR for repair by intramedullary nailing. Admitted to the hospital for further management including pain control and PT/OT evaluation postoperatively   Review of Systems: Unable to obtain ROS due to patient's dementia  Past Medical History:  Diagnosis Date   Alzheimer disease (HCC)    High cholesterol    Past Surgical History:  Procedure Laterality Date   HIP PINNING,CANNULATED Right 09/09/2019   Procedure: CANNULATED HIP PINNING;  Surgeon: Juanell Fairly, MD;  Location: [facility] ORS;  Service: Orthopedics;  Laterality:  Right;   INTRAMEDULLARY (IM) NAIL INTERTROCHANTERIC Left 08/31/2021   Procedure: INTRAMEDULLARY (IM) NAIL INTERTROCHANTERIC;  Surgeon: Signa Kell, MD;  Location: [facility] ORS;  Service: Orthopedics;  Laterality: Left;   Social History:  reports that she has never smoked. She has never used smokeless tobacco. She reports that she does not drink alcohol and does not use drugs.  No Known Allergies  History reviewed. No pertinent family history.  Prior to Admission medications   Medication Sig Start Date End Date Taking? Authorizing Provider  ascorbic acid (VITAMIN C) 500 MG tablet Take 500 mg by mouth 2 (two) times daily.    [provider]  atorvastatin (LIPITOR) 10 MG tablet Take 10 mg by mouth daily.    [provider]  Cholecalciferol (VITAMIN D) 50 MCG (2000 UT) tablet Take 2,000 Units by mouth daily.    [provider]  cyanocobalamin 1000 MCG tablet Take 1,000 mcg by mouth daily.     [provider]  donepezil (ARICEPT) 10 MG tablet Take 10 mg by mouth at bedtime.    [provider]  enoxaparin (LOVENOX) 40 MG/0.4ML injection Inject 0.4 mLs (40 mg total) into the skin daily for 14 days. 09/14/19 09/28/19  Lurene Shadow, MD  HYDROcodone-acetaminophen (NORCO/VICODIN) 5-325 MG tablet Take 1 tablet by mouth every 8 (eight) hours as needed for moderate pain (pain score 4-6). 09/13/19   Lurene Shadow, MD  polyethylene glycol (MIRALAX / GLYCOLAX) 17 g packet Take 17 g by mouth daily as needed for mild constipation. 09/13/19   Lurene Shadow, MD  raloxifene (EVISTA) 60 MG  tablet Take 60 mg by mouth daily.    [provider]  sertraline (ZOLOFT) 100 MG tablet Take 100 mg by mouth at bedtime.     [provider]    Physical Exam: Vitals:   08/31/21 2130 08/31/21 2316 09/01/21 0512 09/01/21 0759  BP: (!) 86/56 (!) 89/53 (!) 102/51 (!) 103/51  Pulse: 86 75 77 81  Resp: 15 15 15 16   Temp:  98 F (36.7 C) 98.3 F (36.8 C) 97.8 F (36.6  C)  TempSrc:      SpO2: 97% 95% 97% 94%  Weight:      Height:       General exam: awake, alert, no acute distress HEENT: moist mucus membranes, hearing grossly normal  Respiratory system: CTAB, no wheezes, rales or rhonchi, normal respiratory effort. Cardiovascular system: normal S1/S2, RRR, no pedal edema.   Gastrointestinal system: soft, NT, ND, no HSM felt, +bowel sounds. Central nervous system: Alert, oriented to self. no gross focal neurologic deficits, normal speech Extremities: No gross deformities on inspection, no edema, normal tone Skin: dry, intact, normal temperature Psychiatry: normal mood, congruent affect, abnormal judgement and insight due to dementia, calm demeanor and cooperative   Data Reviewed:  Notable labs --- outlined above in ED course  Assessment and Plan: * Closed intertrochanteric fracture of left femur [facility]) Patient had an unwitnessed fall at her ALF with subsequent left hip pain.  Patient has osteoporosis and prior history of right hip fracture in 2021.-- Orthopedic surgery consulted -- Underwent ORIF day of admission 8/10 -- PT OT -- Pain control -scheduled Tylenol, as needed tramadol or oxycodone, as needed Robaxin -- Bowel regimen -- DVT prophylaxis -Lovenox, TED hose, SCDs -- Weightbearing as tolerated on LLE -- Monitor hemoglobin -- TOC following for anticipated SNF placement -- Fall precautions  Hyperlipidemia Continue Lipitor  Osteoporosis Continue on calcium and vitamin D supplements  Dementia without behavioral disturbance [facility]) Patient is a resident of [facility], ALF memory care.   --Continue Aricept, Zoloft --Delirium precautions       Advance Care Planning: CODE STATUS: DNR I discussed CODE STATUS with patient's son at bedside who stated patient would not want any CPR, defibrillation or intubation/mechanical ventilation  Consults: Ortho surgery  Family Communication: Son at bedside during admission encounter  Severity  of Illness: The appropriate patient status for this patient is INPATIENT. Inpatient status is judged to be reasonable and necessary in order to provide the required intensity of service to ensure the patient's safety. The patient's presenting symptoms, physical exam findings, and initial radiographic and laboratory data in the context of their chronic comorbidities is felt to place them at high risk for further clinical deterioration. Furthermore, it is not anticipated that the patient will be medically stable for discharge from the hospital within 2 midnights of admission.   * I certify that at the point of admission it is my clinical judgment that the patient will require inpatient hospital care spanning beyond 2 midnights from the point of admission due to high intensity of service, high risk for further deterioration and high frequency of surveillance required.*  Author: [place], DO 09/01/2021 3:13 PM  For on call review [email or web address].

## 2021-09-01 ENCOUNTER — Encounter: Payer: Self-pay | Admitting: Orthopedic Surgery

## 2021-09-01 DIAGNOSIS — M810 Age-related osteoporosis without current pathological fracture: Secondary | ICD-10-CM | POA: Diagnosis present

## 2021-09-01 DIAGNOSIS — F0390 Unspecified dementia without behavioral disturbance: Secondary | ICD-10-CM | POA: Diagnosis not present

## 2021-09-01 DIAGNOSIS — E785 Hyperlipidemia, unspecified: Secondary | ICD-10-CM | POA: Diagnosis present

## 2021-09-01 DIAGNOSIS — S72145A Nondisplaced intertrochanteric fracture of left femur, initial encounter for closed fracture: Secondary | ICD-10-CM | POA: Diagnosis not present

## 2021-09-01 LAB — CBC
HCT: 27.1 % — ABNORMAL LOW (ref 36.0–46.0)
Hemoglobin: 8.9 g/dL — ABNORMAL LOW (ref 12.0–15.0)
MCH: 30.7 pg (ref 26.0–34.0)
MCHC: 32.8 g/dL (ref 30.0–36.0)
MCV: 93.4 fL (ref 80.0–100.0)
Platelets: 126 K/uL — ABNORMAL LOW (ref 150–400)
RBC: 2.9 MIL/uL — ABNORMAL LOW (ref 3.87–5.11)
RDW: 13.9 % (ref 11.5–15.5)
WBC: 13.2 K/uL — ABNORMAL HIGH (ref 4.0–10.5)
nRBC: 0 % (ref 0.0–0.2)

## 2021-09-01 LAB — BASIC METABOLIC PANEL WITH GFR
Anion gap: 6 (ref 5–15)
BUN: 21 mg/dL (ref 8–23)
CO2: 24 mmol/L (ref 22–32)
Calcium: 7.6 mg/dL — ABNORMAL LOW (ref 8.9–10.3)
Chloride: 106 mmol/L (ref 98–111)
Creatinine, Ser: 1.19 mg/dL — ABNORMAL HIGH (ref 0.44–1.00)
GFR, Estimated: 44 mL/min — ABNORMAL LOW (ref >60)
Glucose, Bld: 119 mg/dL — ABNORMAL HIGH (ref 70–99)
Potassium: 4 mmol/L (ref 3.5–5.1)
Sodium: 136 mmol/L (ref 135–145)

## 2021-09-01 LAB — GLUCOSE, CAPILLARY
Glucose-Capillary: 112 mg/dL — ABNORMAL HIGH (ref 70–99)
Glucose-Capillary: 121 mg/dL — ABNORMAL HIGH (ref 70–99)

## 2021-09-01 NOTE — Assessment & Plan Note (Addendum)
Patient had an unwitnessed fall at her ALF with subsequent left hip pain.  Patient has osteoporosis and prior history of right hip fracture in 2021.-- Orthopedic surgery consulted -- Underwent ORIF day of admission 8/10 -- PT OT - recommend SNF -- Pain control -scheduled Tylenol. All narcotic and sedating medications have been discontinued due to decreased level of consciousness. -- Bowel regimen -- DVT prophylaxis - TED hose, SCDs --Lovenox on hold due to anemia, resume tomorrow if stable -- Weightbearing as tolerated on LLE -- Fall precautions

## 2021-09-01 NOTE — Evaluation (Addendum)
Occupational Therapy Evaluation Patient Details Name: Dana Love MRN: [id number] DOB: 07/06/1932 Today's Date: 09/01/2021   History of Present Illness Dana Love is a 88 y.o. female with a history of significant dementia who had a fall earlier today.  The patient noted immediate hip pain and inability to ambulate. S/p L femur IM nail on 08/31/21.   Clinical Impression   Dana Love was seen for OT evaluation this date. Prior to hospital admission, pt was MOD I for mobility using RW. Pt lives at [facility] memory care unit. Pt presents to acute OT demonstrating impaired ADL performance and functional mobility 2/2 decreased activity tolerance and functional strength/ROM/balance deficits. Pt A&O x2, self and location as Dana Love. Repeates same conversation over asking "what happened, where did I fall, when did I get here, etc," however is pleasant and redirectable with cueing.  Pt currently requires MOD A exit bed. MOD A standing from regular bed height improving to MIN A standing from [facility], assist for balance to achieve full upright posture. MIN A + RW side steps to chair ~2 ft, assist to manage RW and cues to weight shift. MAX A don B socks, SETUP + SUPERVISION self-feeding/drinking in sitting. Pt would benefit from skilled OT to address noted impairments and functional limitations (see below for any additional details). Upon hospital discharge, recommend STR to maximize pt safety and return to PLOF.     Recommendations for follow up therapy are one component of a multi-disciplinary discharge planning process, led by the attending physician.  Recommendations may be updated based on patient status, additional functional criteria and insurance authorization.   Follow Up Recommendations  Skilled nursing-short term rehab (<3 hours/day)    Assistance Recommended at Discharge Frequent or constant Supervision/Assistance  Patient can return home with the following A lot of help with walking  and/or transfers;A lot of help with bathing/dressing/bathroom;Help with stairs or ramp for entrance    Functional Status Assessment  Patient has had a recent decline in their functional status and demonstrates the ability to make significant improvements in function in a reasonable and predictable amount of time.  Equipment Recommendations  Other (comment) (defer to next venue of care)    Recommendations for Other Services       Precautions / Restrictions Precautions Precautions: Fall Restrictions Weight Bearing Restrictions: Yes LLE Weight Bearing: Weight bearing as tolerated      Mobility Bed Mobility Overal bed mobility: Needs Assistance Bed Mobility: Supine to Sit     Supine to sit: Mod assist, HOB elevated     General bed mobility comments: assist for LLE mgmt and trunk    Transfers Overall transfer level: Needs assistance Equipment used: Rolling walker (2 wheels) Transfers: Sit to/from Stand, Bed to chair/wheelchair/BSC Sit to Stand: Min assist, From elevated surface     Step pivot transfers: Min assist     General transfer comment: MIN A standing from [facility], assist for balance to achieve full upright posture. Assist to manage RW for shuffling steps      Balance Overall balance assessment: Needs assistance Sitting-balance support: No upper extremity supported, Feet supported Sitting balance-Leahy Scale: Good     Standing balance support: Bilateral upper extremity supported Standing balance-Leahy Scale: Poor                             ADL either performed or assessed with clinical judgement   ADL Overall ADL's : Needs assistance/impaired  General ADL Comments: MIN A + RW for BSC t/f. MAX A don B socks in sitting. SETUP + SUPERVISION self-feeding/drinking in sitting.      Pertinent Vitals/Pain Pain Assessment Pain Assessment: 0-10 Pain Score: 6  Pain Location: L hip Pain  Descriptors / Indicators: Dull, Discomfort Pain Intervention(s): Limited activity within patient's tolerance, Repositioned, Ice applied     Hand Dominance     Extremity/Trunk Assessment Upper Extremity Assessment Upper Extremity Assessment: Generalized weakness   Lower Extremity Assessment Lower Extremity Assessment: Generalized weakness       Communication Communication Communication: No difficulties   Cognition Arousal/Alertness: Awake/alert Behavior During Therapy: WFL for tasks assessed/performed Overall Cognitive Status: History of cognitive impairments - at baseline                                 General Comments: Oriented to self and location as Dana Love. Repeates same conversation over asking "what happened, where did I fall, when did I get here, etc." Is pleasant and redirectable with cueing.                Home Living Family/patient expects to be discharged to:: Assisted living                             Home Equipment: Agricultural consultant (2 wheels)   Additional Comments: [facility] memory care unit      Prior Functioning/Environment Prior Level of Function : Needs assist  Cognitive Assist : ADLs (cognitive)                      OT Problem List: Decreased strength;Decreased range of motion;Impaired balance (sitting and/or standing);Decreased activity tolerance;Decreased safety awareness      OT Treatment/Interventions: Self-care/ADL training;Therapeutic exercise;Energy conservation;DME and/or AE instruction;Therapeutic activities;Balance training;Patient/family education    OT Goals(Current goals can be found in the care plan section) Acute Rehab OT Goals Patient Stated Goal: to return to PLOF OT Goal Formulation: With patient/family Time For Goal Achievement: 09/15/21 Potential to Achieve Goals: Good ADL Goals Pt Will Perform Grooming: standing;with supervision;with set-up Pt Will Perform Lower Body Dressing:  sitting/lateral leans;with supervision Pt Will Transfer to Toilet: with set-up;with supervision;ambulating;regular height toilet  OT Frequency: Min 2X/week    Co-evaluation              AM-PAC OT "6 Clicks" Daily Activity     Outcome Measure Help from another person eating meals?: None Help from another person taking care of personal grooming?: A Little Help from another person toileting, which includes using toliet, bedpan, or urinal?: A Lot Help from another person bathing (including washing, rinsing, drying)?: A Lot Help from another person to put on and taking off regular upper body clothing?: A Little Help from another person to put on and taking off regular lower body clothing?: A Lot 6 Click Score: 16   End of Session Nurse Communication: Mobility status;Patient requests pain meds  Activity Tolerance: Patient tolerated treatment well Patient left: in chair;with call bell/phone within reach;with family/visitor present;with nursing/sitter in room;with chair alarm set  OT Visit Diagnosis: Other abnormalities of gait and mobility (R26.89);Muscle weakness (generalized) (M62.81);History of falling (Z91.81)                Time: 3536-1443 OT Time Calculation (min): 33 min Charges:  OT General Charges $OT Visit: 1 Visit OT Evaluation $OT Eval Moderate  Complexity: 1 Mod OT Treatments $Self Care/Home Management : 8-22 mins  Kathie Dike, M.S. OTR/L  09/01/21, 9:34 AM  ascom [phone]

## 2021-09-01 NOTE — Assessment & Plan Note (Signed)
Continue on calcium and vitamin D supplements

## 2021-09-01 NOTE — Evaluation (Signed)
Physical Therapy Evaluation Patient Details Name: Dana Love MRN: [id number] DOB: Jun 13, 1932 Today's Date: 09/01/2021  History of Present Illness  Dana Love is an 88 y.o. female with a history of significant dementia who had a fall earlier today.  The patient noted immediate hip pain and inability to ambulate. S/p L femur IM nail on 08/31/21.  Clinical Impression  Pt presented sitting upright in recliner chair, awake and willing to participate in therapy session. Prior to admission, pt was living at [facility] (memory care facility) where she ambulated with use of a RW per family report. At the time of evaluation, pt required min A for transfers and ambulated within her room with use of RW and min guard for safety. She required frequent cueing for redirection and orientation throughout. Pt would continue to benefit from skilled physical therapy services at this time while admitted and after d/c to address the below listed limitations in order to improve overall safety and independence with functional mobility.      Recommendations for follow up therapy are one component of a multi-disciplinary discharge planning process, led by the attending physician.  Recommendations may be updated based on patient status, additional functional criteria and insurance authorization.  Follow Up Recommendations Skilled nursing-short term rehab (<3 hours/day) Can patient physically be transported by private vehicle: Yes    Assistance Recommended at Discharge Frequent or constant Supervision/Assistance  Patient can return home with the following  A little help with walking and/or transfers;A little help with bathing/dressing/bathroom;Assist for transportation;Assistance with cooking/housework    Equipment Recommendations None recommended by PT  Recommendations for Other Services       Functional Status Assessment Patient has had a recent decline in their functional status and demonstrates the  ability to make significant improvements in function in a reasonable and predictable amount of time.     Precautions / Restrictions Precautions Precautions: Fall Restrictions Weight Bearing Restrictions: Yes LLE Weight Bearing: Weight bearing as tolerated      Mobility  Bed Mobility               General bed mobility comments: pt OOB in recliner chair upon PT arrival    Transfers Overall transfer level: Needs assistance Equipment used: Rolling walker (2 wheels) Transfers: Sit to/from Stand Sit to Stand: Min assist           General transfer comment: min A for stability with transition; good/safe hand placement with use of RW    Ambulation/Gait Ambulation/Gait assistance: Min guard Gait Distance (Feet): 50 Feet Assistive device: Rolling walker (2 wheels) Gait Pattern/deviations: Step-to pattern, Step-through pattern, Decreased stride length, Trunk flexed Gait velocity: decreased     General Gait Details: pt with slow, steady gait with use of RW, very short step length bilaterally, VC'ing to maintain proximity to [employer]    Modified Rankin (Stroke Patients Only)       Balance Overall balance assessment: Needs assistance Sitting-balance support: Feet supported Sitting balance-Leahy Scale: Good     Standing balance support: Bilateral upper extremity supported Standing balance-Leahy Scale: Poor                               Pertinent Vitals/Pain Pain Assessment Pain Assessment: Faces Faces Pain Scale: Hurts little more Pain Location: L hip Pain Descriptors / Indicators: Guarding Pain Intervention(s): Monitored during session, Repositioned, RN  gave pain meds during session    Home Living Family/patient expects to be discharged to:: Assisted living                 Home Equipment: Agricultural consultant (2 wheels) Additional Comments: [facility] memory care unit    Prior Function Prior Level of  Function : Needs assist  Cognitive Assist : ADLs (cognitive)           Mobility Comments: per family, pt previously ambulating with a RW       Hand Dominance        Extremity/Trunk Assessment   Upper Extremity Assessment Upper Extremity Assessment: Defer to OT evaluation    Lower Extremity Assessment Lower Extremity Assessment: Difficult to assess due to impaired cognition;LLE deficits/detail LLE Deficits / Details: pt with decreased strength and AROM deficits secondary to post-op pain and weakness; difficult to fully assess due to cognitive impairments       Communication   Communication: No difficulties  Cognition Arousal/Alertness: Awake/alert Behavior During Therapy: WFL for tasks assessed/performed Overall Cognitive Status: History of cognitive impairments - at baseline Area of Impairment: Orientation, Attention, Memory, Following commands, Safety/judgement, Awareness, Problem solving                 Orientation Level: Disoriented to, Situation, Time Current Attention Level: Sustained Memory: Decreased short-term memory Following Commands: Follows one step commands with increased time Safety/Judgement: Decreased awareness of safety, Decreased awareness of deficits Awareness: Intellectual Problem Solving: Requires verbal cues, Requires tactile cues          General Comments      Exercises     Assessment/Plan    PT Assessment Patient needs continued PT services  PT Problem List Decreased strength;Decreased range of motion;Decreased activity tolerance;Decreased balance;Decreased mobility;Decreased coordination;Decreased cognition;Decreased knowledge of use of DME;Decreased safety awareness;Decreased knowledge of precautions;Pain       PT Treatment Interventions DME instruction;Gait training;Stair training;Therapeutic activities;Therapeutic exercise;Functional mobility training;Balance training;Neuromuscular re-education;Patient/family education     PT Goals (Current goals can be found in the Care Plan section)  Acute Rehab PT Goals Patient Stated Goal: decrease pain PT Goal Formulation: With patient/family Time For Goal Achievement: 09/15/21 Potential to Achieve Goals: Good    Frequency 7X/week     Co-evaluation               AM-PAC PT "6 Clicks" Mobility  Outcome Measure Help needed turning from your back to your side while in a flat bed without using bedrails?: A Little Help needed moving from lying on your back to sitting on the side of a flat bed without using bedrails?: A Little Help needed moving to and from a bed to a chair (including a wheelchair)?: A Little Help needed standing up from a chair using your arms (e.g., wheelchair or bedside chair)?: A Little Help needed to walk in hospital room?: A Little Help needed climbing 3-5 steps with a railing? : A Lot 6 Click Score: 17    End of Session Equipment Utilized During Treatment: Gait belt Activity Tolerance: Patient tolerated treatment well Patient left: in chair;with call bell/phone within reach;with chair alarm set;with family/visitor present;with SCD's reapplied Nurse Communication: Mobility status PT Visit Diagnosis: Other abnormalities of gait and mobility (R26.89)    Time: 7408-1448 PT Time Calculation (min) (ACUTE ONLY): 23 min   Charges:   PT Evaluation $PT Eval Moderate Complexity: 1 Mod PT Treatments $Gait Training: 8-22 mins        Ginette Pitman, PT, DPT  Acute  Rehabilitation Services Office [phone]   Alessandra Bevels Shirley Bolle 09/01/2021, 11:25 AM

## 2021-09-01 NOTE — Assessment & Plan Note (Addendum)
Patient is a resident of [facility] memory care.   --Continue Aricept, Zoloft --Delirium precautions

## 2021-09-01 NOTE — TOC Progression Note (Signed)
Transition of Care [facility]) - Progression Note    Patient Details  Name: Dana Love MRN: [id number] Date of Birth: 11/30/1932  Transition of Care [facility]) CM/SW Contact  Marlowe Sax, RN Phone Number: 09/01/2021, 8:49 AM  Clinical Narrative:     Patient has dementia and lives at [facility], she plans to go to [facility] SNF, prior to returning to [facility] care, PASSR obtained, FL2 completed, Bedsearch sent       Expected Discharge Plan and Services                                                 Social Determinants of Health (SDOH) Interventions    Readmission Risk Interventions     No data to display

## 2021-09-01 NOTE — Progress Notes (Signed)
Progress Note   Patient: Dana Love BJS:[id number] DOB: 1933/01/19 DOA: 08/31/2021     1 DOS: the patient was seen and examined on 09/01/2021   Brief hospital course: Dana Love is a 88 y.o. female with medical history significant of dementia, osteoporosis, hyperlipidemia, right hip fracture in 2021 presented to the ED on 08/31/2021 after having an unwitnessed fall and subsequent pain in her left hip and numbness and tingling in the left leg and lower back.  Due to baseline dementia, patient was unable to provide history or details of any symptoms prior to the fall. When seen in the ED on admission, patient denied having any pain at the time.  She did not know that she is in the hospital or why she is here.  Son was at bedside and confirms this to be her cognitive baseline.   ED course --BP elevated 171/77, heart rate 59 otherwise normal vital signs. CMP notable only for glucose of 109, calcium 8.5. CBC notable only for mildly low platelets 134. Imaging --unremarkable CT head and CT cervical spine.  CT lumbar spine showed likely subacute or chronic bilateral sacral insufficiency fractures and chronic degenerative changes. Left hip x-ray showed an acute intertrochanteric fracture.   Orthopedic surgery was consulted and took patient to the OR for repair by intramedullary nailing. Admitted to the hospital for further management including pain control and PT/OT evaluation postoperatively  Assessment and Plan: * Closed intertrochanteric fracture of left femur [facility]) Patient had an unwitnessed fall at her ALF with subsequent left hip pain.  Patient has osteoporosis and prior history of right hip fracture in 2021.-- Orthopedic surgery consulted -- Underwent ORIF day of admission 8/10 -- PT OT -- Pain control -scheduled Tylenol, as needed tramadol or oxycodone, as needed Robaxin -- Bowel regimen -- DVT prophylaxis -Lovenox, TED hose, SCDs -- Weightbearing as tolerated on LLE -- Monitor  hemoglobin -- TOC following for anticipated SNF placement -- Fall precautions  Hyperlipidemia Continue Lipitor  Osteoporosis Continue on calcium and vitamin D supplements  Dementia without behavioral disturbance [facility]) Patient is a resident of [facility], AL[facility] memory care.   --Continue Aricept, Zoloft --Delirium precautions         Subjective: Patient was awake resting in bed when seen on rounds this morning.  She remains pleasantly confused and not sure why she is in the hospital.  She denies any pain, feeling sick or anything bothering her at this time.  No acute events reported.  Physical Exam: Vitals:   08/31/21 2130 08/31/21 2316 09/01/21 0512 09/01/21 0759  BP: (!) 86/56 (!) 89/53 (!) 102/51 (!) 103/51  Pulse: 86 75 77 81  Resp: 15 15 15 16   Temp:  98 F (36.7 C) 98.3 F (36.8 C) 97.8 F (36.6 C)  TempSrc:      SpO2: 97% 95% 97% 94%  Weight:      Height:       General exam: awake, alert, no acute distress HEENT: moist mucus membranes, hearing grossly normal  Respiratory system: CTAB, no wheezes, rales or rhonchi, normal respiratory effort. Cardiovascular system: normal S1/S2,  RRR, no pedal edema.   Gastrointestinal system: soft, NT, ND, no HSM felt, +bowel sounds. Central nervous system: A&O x self. no gross focal neurologic deficits, normal speech Extremities: no edema, normal tone Skin: dry, intact, normal temperature Psychiatry: normal mood, congruent affect, abnormal judgement due to dementia   Data Reviewed:  Notable labs: Hemoglobin 8.9 from 12.7, white count 13.2 from 10.5, platelets 126  from 134, glucose 119, creatinine 1.19 from 0.84, calcium 7.6, GFR 44  Family Communication: Son was at bedside during admission encounter yesterday.    Disposition: Status is: Inpatient Remains inpatient appropriate because: Requires SNF placement for rehab, monitoring postop anemia   Planned Discharge Destination: Skilled nursing facility    Time spent: 40  minutes  Author: Pennie Banter, DO 09/01/2021 3:15 PM  For on call review [email or web address].

## 2021-09-01 NOTE — Assessment & Plan Note (Signed)
-  Continue Lipitor °

## 2021-09-01 NOTE — Anesthesia Postprocedure Evaluation (Signed)
Anesthesia Post Note  Patient: Dana Love  Procedure(s) Performed: INTRAMEDULLARY (IM) NAIL INTERTROCHANTERIC (Left)  Patient location during evaluation: PACU Anesthesia Type: General Level of consciousness: awake and alert Pain management: pain level controlled Vital Signs Assessment: post-procedure vital signs reviewed and stable Respiratory status: spontaneous breathing, nonlabored ventilation, respiratory function stable and patient connected to nasal cannula oxygen Cardiovascular status: blood pressure returned to baseline and stable Postop Assessment: no apparent nausea or vomiting Anesthetic complications: no   No notable events documented.   Last Vitals:  Vitals:   08/31/21 2316 09/01/21 0512  BP: (!) 89/53 (!) 102/51  Pulse: 75 77  Resp: 15 15  Temp: 36.7 C 36.8 C  SpO2: 95% 97%    Last Pain:  Vitals:   08/31/21 1947  TempSrc:   PainSc: 0-No pain                 Stephanie Coup

## 2021-09-01 NOTE — Progress Notes (Signed)
   Subjective: 1 Day Post-Op Procedure(s) (LRB): INTRAMEDULLARY (IM) NAIL INTERTROCHANTERIC (Left) Patient reports pain as 0 on 0-10 scale.   Patient is confused, son at bedside. Dementia at baseline. No complaints Denies any CP, SOB, ABD pain. We will continue therapy today.  Plan is to go Skilled nursing facility after hospital stay.  Objective: Vital signs in last 24 hours: Temp:  [97.2 F (36.2 C)-98.3 F (36.8 C)] 97.8 F (36.6 C) (08/11 0759) Pulse Rate:  [57-94] 81 (08/11 0759) Resp:  [12-18] 16 (08/11 0759) BP: (82-134)/(44-65) 103/51 (08/11 0759) SpO2:  [88 %-98 %] 94 % (08/11 0759) Weight:  [59.4 kg] 59.4 kg (08/10 1209)  Intake/Output from previous day: 08/10 0701 - 08/11 0700 In: 2334.9 [P.O.:240; I.V.:1594.9; IV Piggyback:500] Out: 1050 [Urine:800; Blood:250] Intake/Output this shift: No intake/output data recorded.  Recent Labs    08/31/21 1030 09/01/21 0625  HGB 12.7 8.9*   Recent Labs    08/31/21 1030 09/01/21 0625  WBC 10.5 13.2*  RBC 4.31 2.90*  HCT 39.7 27.1*  PLT 134* 126*   Recent Labs    08/31/21 1030 09/01/21 0625  NA 140 136  K 4.1 4.0  CL 107 106  CO2 27 24  BUN 13 21  CREATININE 0.84 1.19*  GLUCOSE 109* 119*  CALCIUM 8.5* 7.6*   Recent Labs    08/31/21 1030  INR 1.0    EXAM General - Patient is Alert, Appropriate, and Confused Extremity - Neurovascular intact Sensation intact distally Intact pulses distally Dorsiflexion/Plantar flexion intact No cellulitis present Compartment soft Dressing - dressing C/D/I and no drainage Motor Function - intact, moving foot and toes well on exam.   Past Medical History:  Diagnosis Date   Alzheimer disease (HCC)    High cholesterol     Assessment/Plan:   1 Day Post-Op Procedure(s) (LRB): INTRAMEDULLARY (IM) NAIL INTERTROCHANTERIC (Left) Principal Problem:   Closed intertrochanteric fracture of left femur (HCC)  Estimated body mass index is 26.46 kg/m as calculated from  the following:   Height as of this encounter: 4\' 11"  (1.499 m).   Weight as of this encounter: 59.4 kg. Advance diet Up with therapy Work on BM VSS Pain controlled Acute post op blood loss anemia - Hgb 8.9, recheck Hgb in the am CM to assist with discharge to SNF  DVT Prophylaxis - Lovenox, TED hose, and SCDs Weight-Bearing as tolerated to left leg   T. , PA-C [facility] 09/01/2021, 8:19 AM

## 2021-09-01 NOTE — Plan of Care (Signed)

## 2021-09-01 NOTE — NC FL2 (Signed)
MEDICAID FL2 LEVEL OF CARE SCREENING TOOL     IDENTIFICATION  Patient Name: Dana Love Birthdate: [birth year] Sex: female Admission Date (Current Location): 08/31/2021  [facility] and [place] Number:  Chiropodist and Address:  [facility], [address] [place], [place] [address]      Provider Number: [id number]  Attending Physician Name and Address:  Pennie Banter, DO  Relative Name and Phone Number:  [name] son, [phone]    Current Level of Care: Hospital Recommended Level of Care: Skilled Nursing Facility Prior Approval Number:    Date Approved/Denied:   PASRR Number: [id number]  Discharge Plan: SNF    Current Diagnoses: Patient Active Problem List   Diagnosis Date Noted   Closed intertrochanteric fracture of left femur (HCC) 08/31/2021   Closed right hip fracture (HCC) 09/08/2019    Orientation RESPIRATION BLADDER Height & Weight     Self  Normal Continent, External catheter Weight: 59.4 kg Height:  4\' 11"  (149.9 cm)  BEHAVIORAL SYMPTOMS/MOOD NEUROLOGICAL BOWEL NUTRITION STATUS      Continent Diet (see dc summary)  AMBULATORY STATUS COMMUNICATION OF NEEDS Skin   Extensive Assist Verbally Normal, Surgical wounds                       Personal Care Assistance Level of Assistance  Bathing, Feeding, Dressing, Total care Bathing Assistance: Limited assistance Feeding assistance: Limited assistance Dressing Assistance: Maximum assistance     Functional Limitations Info  Sight, Hearing Sight Info: Impaired Hearing Info: Impaired      SPECIAL CARE FACTORS FREQUENCY  PT (By licensed PT), OT (By licensed OT)     PT Frequency: 5 times per week OT Frequency: 5 times per week            Contractures Contractures Info: Not present    Additional Factors Info  Code Status, Allergies Code Status Info: Full code Allergies Info: NKDA           Current Medications (09/01/2021):  This is the  current hospital active medication list Current Facility-Administered Medications  Medication Dose Route Frequency Provider Last Rate Last Admin   0.9 %  sodium chloride infusion   Intravenous Continuous 11/01/2021, MD 10 mL/hr at 09/01/21 0604 Rate Change at 09/01/21 0604   acetaminophen (TYLENOL) tablet 1,000 mg  1,000 mg Oral Q8H 11/01/21, MD   1,000 mg at 09/01/21 0557   acetaminophen (TYLENOL) tablet 325-650 mg  325-650 mg Oral Q6H PRN 11/01/21, MD       ascorbic acid (VITAMIN C) tablet 500 mg  500 mg Oral BID Signa Kell, MD   500 mg at 08/31/21 2220   atorvastatin (LIPITOR) tablet 10 mg  10 mg Oral Daily 2221, MD   10 mg at 08/31/21 1703   bisacodyl (DULCOLAX) suppository 10 mg  10 mg Rectal Daily PRN 10/31/21, MD       ceFAZolin (ANCEF) IVPB 2g/100 mL premix  2 g Intravenous Q6H Signa Kell, MD 200 mL/hr at 09/01/21 0054 2 g at 09/01/21 0054   cholecalciferol (VITAMIN D3) 25 MCG (1000 UNIT) tablet 2,000 Units  2,000 Units Oral Daily 11/01/21, MD   2,000 Units at 08/31/21 1703   cyanocobalamin (VITAMIN B12) tablet 1,000 mcg  1,000 mcg Oral Daily 10/31/21, MD   1,000 mcg at 08/31/21 1703   docusate sodium (COLACE) capsule 100 mg  100 mg Oral BID 10/31/21, MD   100  mg at 08/31/21 2220   donepezil (ARICEPT) tablet 10 mg  10 mg Oral QHS Signa Kell, MD   10 mg at 08/31/21 2220   enoxaparin (LOVENOX) injection 40 mg  40 mg Subcutaneous Q24H Signa Kell, MD       feeding supplement (ENSURE ENLIVE / ENSURE PLUS) liquid 237 mL  237 mL Oral BID BM Esaw Grandchild A, DO       HYDROmorphone (DILAUDID) injection 0.2-0.4 mg  0.2-0.4 mg Intravenous Q4H PRN Signa Kell, MD       ketorolac (TORADOL) 15 MG/ML injection 7.5 mg  7.5 mg Intravenous Q6H Signa Kell, MD   7.5 mg at 09/01/21 0559   methocarbamol (ROBAXIN) tablet 500 mg  500 mg Oral Q6H PRN Signa Kell, MD       Or   methocarbamol (ROBAXIN) 500 mg in dextrose 5 % 50 mL IVPB  500 mg Intravenous Q6H PRN  Signa Kell, MD       metoCLOPramide (REGLAN) tablet 5-10 mg  5-10 mg Oral Q8H PRN Signa Kell, MD       Or   metoCLOPramide (REGLAN) injection 5-10 mg  5-10 mg Intravenous Q8H PRN Signa Kell, MD       multivitamin with minerals tablet 1 tablet  1 tablet Oral Daily Esaw Grandchild A, DO       ondansetron [facility]) tablet 4 mg  4 mg Oral Q6H PRN Signa Kell, MD       Or   ondansetron [facility]) injection 4 mg  4 mg Intravenous Q6H PRN Signa Kell, MD       oxyCODONE (Oxy IR/ROXICODONE) immediate release tablet 2.5-5 mg  2.5-5 mg Oral Q4H PRN Signa Kell, MD       oxyCODONE (Oxy IR/ROXICODONE) immediate release tablet 5-10 mg  5-10 mg Oral Q4H PRN Signa Kell, MD       polyethylene glycol (MIRALAX / GLYCOLAX) packet 17 g  17 g Oral Daily PRN Signa Kell, MD       raloxifene (EVISTA) tablet 60 mg  60 mg Oral Daily Signa Kell, MD       senna-docusate (Senokot-S) tablet 1 tablet  1 tablet Oral QHS PRN Signa Kell, MD       sertraline (ZOLOFT) tablet 100 mg  100 mg Oral QHS Signa Kell, MD   100 mg at 08/31/21 2220   sodium phosphate (FLEET) 7-19 GM/118ML enema 1 enema  1 enema Rectal Once PRN Signa Kell, MD       traMADol Janean Sark) tablet 50 mg  50 mg Oral Q6H PRN Signa Kell, MD         Discharge Medications: Please see discharge summary for a list of discharge medications.  Relevant Imaging Results:  Relevant Lab Results:   Additional Information SS# [id number]  Marlowe Sax, RN

## 2021-09-02 DIAGNOSIS — S72145A Nondisplaced intertrochanteric fracture of left femur, initial encounter for closed fracture: Secondary | ICD-10-CM | POA: Diagnosis not present

## 2021-09-02 DIAGNOSIS — D62 Acute posthemorrhagic anemia: Secondary | ICD-10-CM | POA: Diagnosis not present

## 2021-09-02 DIAGNOSIS — D696 Thrombocytopenia, unspecified: Secondary | ICD-10-CM | POA: Diagnosis present

## 2021-09-02 LAB — CBC
HCT: 25.3 % — ABNORMAL LOW (ref 36.0–46.0)
Hemoglobin: 8.2 g/dL — ABNORMAL LOW (ref 12.0–15.0)
MCH: 29.9 pg (ref 26.0–34.0)
MCHC: 32.4 g/dL (ref 30.0–36.0)
MCV: 92.3 fL (ref 80.0–100.0)
Platelets: 97 10*3/uL — ABNORMAL LOW (ref 150–400)
RBC: 2.74 MIL/uL — ABNORMAL LOW (ref 3.87–5.11)
RDW: 14.1 % (ref 11.5–15.5)
WBC: 11.6 10*3/uL — ABNORMAL HIGH (ref 4.0–10.5)
nRBC: 0 % (ref 0.0–0.2)

## 2021-09-02 LAB — BASIC METABOLIC PANEL
Anion gap: 4 — ABNORMAL LOW (ref 5–15)
BUN: 21 mg/dL (ref 8–23)
CO2: 26 mmol/L (ref 22–32)
Calcium: 8.1 mg/dL — ABNORMAL LOW (ref 8.9–10.3)
Chloride: 105 mmol/L (ref 98–111)
Creatinine, Ser: 0.81 mg/dL (ref 0.44–1.00)
GFR, Estimated: >60 mL/min (ref >60)
Glucose, Bld: 124 mg/dL — ABNORMAL HIGH (ref 70–99)
Potassium: 3.8 mmol/L (ref 3.5–5.1)
Sodium: 135 mmol/L (ref 135–145)

## 2021-09-02 LAB — GLUCOSE, CAPILLARY: Glucose-Capillary: 109 mg/dL — ABNORMAL HIGH (ref 70–99)

## 2021-09-02 NOTE — Assessment & Plan Note (Addendum)
Hbg initially 12.7 on admission, trending down since surgery.   8/14 Hbg 6.8 >> 6.3; transfused 1 unit pRBC's 8/15 Hbg 7.6 >> 7.4 after transfusion. 8/16 Hemoglobin 7.3 this morning. Left lateral thigh occlusive dressing clean, no surrounding swelling to suggest hematoma. --Iron infusion  --Hgb this morning was 6.4. No transfusion per patient's son. --No signs of active bleeding, monitor

## 2021-09-02 NOTE — Progress Notes (Addendum)
Progress Note   Patient: Dana Love PJK:[id number] DOB: 03-May-1932 DOA: 08/31/2021     2 DOS: the patient was seen and examined on 09/02/2021   Brief hospital course: JAELLA WEINERT is a 88 y.o. female with medical history significant of dementia, osteoporosis, hyperlipidemia, right hip fracture in 2021 presented to the ED on 08/31/2021 after having an unwitnessed fall and subsequent pain in her left hip and numbness and tingling in the left leg and lower back.  Due to baseline dementia, patient was unable to provide history or details of any symptoms prior to the fall. When seen in the ED on admission, patient denied having any pain at the time.  She did not know that she is in the hospital or why she is here.  Son was at bedside and confirms this to be her cognitive baseline.   ED course --BP elevated 171/77, heart rate 59 otherwise normal vital signs. CMP notable only for glucose of 109, calcium 8.5. CBC notable only for mildly low platelets 134. Imaging --unremarkable CT head and CT cervical spine.  CT lumbar spine showed likely subacute or chronic bilateral sacral insufficiency fractures and chronic degenerative changes. Left hip x-ray showed an acute intertrochanteric fracture.   Orthopedic surgery was consulted and took patient to the OR for repair by intramedullary nailing. Admitted to the hospital for further management including pain control and PT/OT evaluation postoperatively  Assessment and Plan: * Closed intertrochanteric fracture of left femur [facility]) Patient had an unwitnessed fall at her ALF with subsequent left hip pain.  Patient has osteoporosis and prior history of right hip fracture in 2021.-- Orthopedic surgery consulted -- Underwent ORIF day of admission 8/10 -- PT OT -- Pain control -scheduled Tylenol, as needed tramadol or oxycodone, as needed Robaxin -- Bowel regimen -- DVT prophylaxis -Lovenox, TED hose, SCDs -- Weightbearing as tolerated on LLE -- Monitor  hemoglobin -- TOC following for anticipated SNF placement -- Fall precautions  Thrombocytopenia (HCC) Appears chronic.  Plt count 141 in December. This admission platelet trend 134 >> 126 >> 97k.  On Lovenox for DVT ppx after hip surgery. --Continue Lovenox for now --Monitor CBC daily --Monitor for bleeding  ABLA (acute blood loss anemia) Hbg initially 12.7 on admission, trending down since surgery -- 8.9>>8.2 this AM. Left lateral thigh occlusive dressing clean, no surrounding swelling to suggest hematoma. --Monitor CBC --No signs of active bleeding  Hyperlipidemia Continue Lipitor  Osteoporosis Continue on calcium and vitamin D supplements  Dementia without behavioral disturbance [facility]) Patient is a resident of [facility], [facility]F memory care.   --Continue Aricept, Zoloft --Delirium precautions         Subjective: Patient was awake resting in bed when seen on rounds this morning, about to work with PT.  Denies much pain.  Says she feels well.  No acute events reported.  Physical Exam: Vitals:   09/01/21 1629 09/01/21 2011 09/02/21 0422 09/02/21 0813  BP: (!) 114/59 (!) 104/52 (!) 127/54 (!) 146/59  Pulse: 71 87 77 78  Resp: 16 18 16 18   Temp: 98.5 F (36.9 C) 98.1 F (36.7 C) 98.1 F (36.7 C) 98.9 F (37.2 C)  TempSrc:  Oral    SpO2: 94% 94% 94% 91%  Weight:      Height:       General exam: awake, alert, no acute distress HEENT: moist mucus membranes, hearing grossly normal  Respiratory system: on room air, normal respiratory effort. Cardiovascular system: brisk cap refill, no pedal edema.  Central nervous system: A&O x self. no gross focal neurologic deficits, normal speech Extremities: no edema, normal tone Skin: dry, intact, normal temperature Psychiatry: normal mood, congruent affect, abnormal judgement due to dementia   Data Reviewed:  Notable labs: Glucose 124, calcium 8.1, anion gap 4, WBC 11.6 down from 13.2, hemoglobin 8.2 from 8.9, platelets 97  down from 126  Family Communication: Son was at bedside during admission encounter yesterday.    Disposition: Status is: Inpatient Remains inpatient appropriate because: Requires SNF placement for rehab, monitoring postop anemia   Planned Discharge Destination: Skilled nursing facility    Time spent: 35 minutes  Author: Pennie Banter, DO 09/02/2021 4:14 PM  For on call review [email or web address].

## 2021-09-02 NOTE — Progress Notes (Signed)
Physical Therapy Treatment Patient Details Name: Dana Love MRN: [id number] DOB: July 20, 1932 Today's Date: 09/02/2021   History of Present Illness Dana Love is an 88 y.o. female with a history of significant dementia who had a fall earlier today.  The patient noted immediate hip pain and inability to ambulate. S/p L femur IM nail on 08/31/21.    PT Comments    Pt seen for PT tx with pt received in bed, unaware of breakfast tray beside her. PT orients pt to situation; pt also requires frequent cuing & redirection to sustain attention to task at hand. Pt demonstrates decreased initiation & movement & ultimately requires max assist for supine>sit with HOB elevated & bed rails. Pt is able to complete STS albeit with poor awareness of safe hand placement as pt pulls to standing with BUE on RW. PT instructs pt to initiate step pivot to recliner but despite ongoing education/cuing pt legs of walker & attempts to reach for armrest, also demonstrating decreased ability to step with RLE>LLE. For safety, PT assists pt back to sitting EOB & pt then completes stand pivot bed>recliner with max assist without AD with assistance for pivoting buttocks. Pt also requires significantly extra time, cuing & assist to fully scoot back in recliner. Pt performs LLE strengthening exercises with cuing & assistance 2/2 weakness. Pt did not mobilize as well as she did yesterday, but unsure if that is due to baseline cognitive deficits or potentially increased L hip pain. Will continue to follow pt acutely to address strengthening, transfers, and gait with LRAD.   Recommendations for follow up therapy are one component of a multi-disciplinary discharge planning process, led by the attending physician.  Recommendations may be updated based on patient status, additional functional criteria and insurance authorization.  Follow Up Recommendations  Skilled nursing-short term rehab (<3 hours/day) Can patient physically be  transported by private vehicle: No   Assistance Recommended at Discharge Frequent or constant Supervision/Assistance  Patient can return home with the following A lot of help with walking and/or transfers;Two people to help with walking and/or transfers;Assist for transportation;Help with stairs or ramp for entrance   Equipment Recommendations   (TBD in next venue)    Recommendations for Other Services       Precautions / Restrictions Precautions Precautions: Fall Restrictions Weight Bearing Restrictions: Yes LLE Weight Bearing: Weight bearing as tolerated     Mobility  Bed Mobility Overal bed mobility: Needs Assistance Bed Mobility: Supine to Sit     Supine to sit: Max assist, HOB elevated     General bed mobility comments: max cuing, pt reports she's moving but she's actually not, HOB fully elevated, use of bed rails    Transfers Overall transfer level: Needs assistance Equipment used: Rolling walker (2 wheels), None Transfers: Sit to/from Stand Sit to Stand: Mod assist Stand pivot transfers: Max assist              Ambulation/Gait                   Stairs             Wheelchair Mobility    Modified Rankin (Stroke Patients Only)       Balance Overall balance assessment: Needs assistance   Sitting balance-Leahy Scale: Good     Standing balance support: Bilateral upper extremity supported Standing balance-Leahy Scale: Poor  Cognition Arousal/Alertness: Awake/alert Behavior During Therapy: WFL for tasks assessed/performed Overall Cognitive Status: History of cognitive impairments - at baseline                                 General Comments: Pt pleasantly confused throughout session, requires cuing for sustained attention to task at hand, follows simple commands with extra time/cuing        Exercises General Exercises - Lower Extremity Long Arc Quad: AROM, Strengthening,  Left, 10 reps, Seated Heel Slides: AAROM, Strengthening, Left, 10 reps Hip Flexion/Marching: AROM, Strengthening, Left, 10 reps, Seated    General Comments        Pertinent Vitals/Pain Pain Assessment Pain Assessment: Faces Faces Pain Scale: Hurts even more Pain Location: L hip Pain Descriptors / Indicators: Grimacing, Guarding Pain Intervention(s): Monitored during session, Limited activity within patient's tolerance, Repositioned    Home Living                          Prior Function            PT Goals (current goals can now be found in the care plan section) Acute Rehab PT Goals Patient Stated Goal: decrease pain PT Goal Formulation: With patient/family Time For Goal Achievement: 09/15/21 Potential to Achieve Goals: Good Progress towards PT goals: PT to reassess next treatment    Frequency    7X/week      PT Plan Current plan remains appropriate    Co-evaluation              AM-PAC PT "6 Clicks" Mobility   Outcome Measure  Help needed turning from your back to your side while in a flat bed without using bedrails?: A Lot Help needed moving from lying on your back to sitting on the side of a flat bed without using bedrails?: Total Help needed moving to and from a bed to a chair (including a wheelchair)?: Total Help needed standing up from a chair using your arms (e.g., wheelchair or bedside chair)?: Total Help needed to walk in hospital room?: Total Help needed climbing 3-5 steps with a railing? : Total 6 Click Score: 7    End of Session Equipment Utilized During Treatment: Gait belt Activity Tolerance: Patient tolerated treatment well Patient left: in chair;with chair alarm set;with call bell/phone within reach   PT Visit Diagnosis: Other abnormalities of gait and mobility (R26.89);Pain;Muscle weakness (generalized) (M62.81);Difficulty in walking, not elsewhere classified (R26.2) Pain - Right/Left: Left Pain - part of body: Hip      Time: 7591-6384 PT Time Calculation (min) (ACUTE ONLY): 20 min  Charges:  $Therapeutic Activity: 8-22 mins                     Aleda Grana, PT, DPT 09/02/21, 9:45 AM   Sandi Mariscal 09/02/2021, 9:41 AM

## 2021-09-02 NOTE — Assessment & Plan Note (Addendum)
Resolving. Appears chronic.  Plt count 141 in December. This admission platelet trend 134 >> 126 >> 97 >> 210k.   --Hold Lovenox with severe anemia --Monitor CBC daily --Monitor for bleeding

## 2021-09-02 NOTE — Progress Notes (Signed)
  Subjective: 2 Days Post-Op Procedure(s) (LRB): INTRAMEDULLARY (IM) NAIL INTERTROCHANTERIC (Left) Patient reports pain as well-controlled.   Patient is well, and has had no acute complaints or problems Plan is to go Skilled nursing facility after hospital stay.  Objective: Vital signs in last 24 hours: Temp:  [98.1 F (36.7 C)-98.9 F (37.2 C)] 98.9 F (37.2 C) (08/12 0813) Pulse Rate:  [71-87] 78 (08/12 0813) Resp:  [16-18] 18 (08/12 0813) BP: (104-146)/(52-59) 146/59 (08/12 0813) SpO2:  [91 %-94 %] 91 % (08/12 0813)  Intake/Output from previous day: No intake or output data in the 24 hours ending 09/02/21 1013  Intake/Output this shift: No intake/output data recorded.  Labs: Recent Labs    08/31/21 1030 09/01/21 0625 09/02/21 0522  HGB 12.7 8.9* 8.2*   Recent Labs    09/01/21 0625 09/02/21 0522  WBC 13.2* 11.6*  RBC 2.90* 2.74*  HCT 27.1* 25.3*  PLT 126* 97*   Recent Labs    09/01/21 0625 09/02/21 0522  NA 136 135  K 4.0 3.8  CL 106 105  CO2 24 26  BUN 21 21  CREATININE 1.19* 0.81  GLUCOSE 119* 124*  CALCIUM 7.6* 8.1*   Recent Labs    08/31/21 1030  INR 1.0     EXAM General - Patient is  oriented to person only, pleasantly demented  Extremity - Neurovascular intact Dorsiflexion/Plantar flexion intact Compartment soft Dressing/Incision -clean, dry, no drainage Motor Function - intact, moving foot and toes well on exam.    Assessment/Plan: 2 Days Post-Op Procedure(s) (LRB): INTRAMEDULLARY (IM) NAIL INTERTROCHANTERIC (Left) Principal Problem:   Closed intertrochanteric fracture of left femur (HCC) Active Problems:   Dementia without behavioral disturbance (HCC)   Osteoporosis   Hyperlipidemia   ABLA (acute blood loss anemia)  Estimated body mass index is 26.46 kg/m as calculated from the following:   Height as of this encounter: 4\' 11"  (1.499 m).   Weight as of this encounter: 59.4 kg. Advance diet Up with therapy  Hg 8.2 this AM,  will continue to monitor   DVT Prophylaxis - Lovenox, Ted hose, and SCDs Weight-Bearing as tolerated to left leg  , PA-C [facility] Orthopaedic Surgery 09/02/2021, 10:13 AM

## 2021-09-03 DIAGNOSIS — S72145A Nondisplaced intertrochanteric fracture of left femur, initial encounter for closed fracture: Secondary | ICD-10-CM | POA: Diagnosis not present

## 2021-09-03 DIAGNOSIS — D62 Acute posthemorrhagic anemia: Secondary | ICD-10-CM | POA: Diagnosis not present

## 2021-09-03 DIAGNOSIS — F0390 Unspecified dementia without behavioral disturbance: Secondary | ICD-10-CM | POA: Diagnosis not present

## 2021-09-03 LAB — CBC
HCT: 20.3 % — ABNORMAL LOW (ref 36.0–46.0)
Hemoglobin: 6.7 g/dL — ABNORMAL LOW (ref 12.0–15.0)
MCH: 30 pg (ref 26.0–34.0)
MCHC: 33 g/dL (ref 30.0–36.0)
MCV: 91 fL (ref 80.0–100.0)
Platelets: 109 10*3/uL — ABNORMAL LOW (ref 150–400)
RBC: 2.23 MIL/uL — ABNORMAL LOW (ref 3.87–5.11)
RDW: 14.1 % (ref 11.5–15.5)
WBC: 12.1 10*3/uL — ABNORMAL HIGH (ref 4.0–10.5)
nRBC: 0 % (ref 0.0–0.2)

## 2021-09-03 LAB — IRON AND TIBC
Iron: 21 ug/dL — ABNORMAL LOW (ref 28–170)
Saturation Ratios: 9 % — ABNORMAL LOW (ref 10.4–31.8)
TIBC: 223 ug/dL — ABNORMAL LOW (ref 250–450)
UIBC: 202 ug/dL

## 2021-09-03 LAB — RETICULOCYTES
Immature Retic Fract: 31.6 % — ABNORMAL HIGH (ref 2.3–15.9)
RBC.: 2.15 MIL/uL — ABNORMAL LOW (ref 3.87–5.11)
Retic Count, Absolute: 70.7 10*3/uL (ref 19.0–186.0)
Retic Ct Pct: 3.3 % — ABNORMAL HIGH (ref 0.4–3.1)

## 2021-09-03 LAB — HEMOGLOBIN AND HEMATOCRIT, BLOOD
HCT: 19.6 % — ABNORMAL LOW (ref 36.0–46.0)
HCT: 21.2 % — ABNORMAL LOW (ref 36.0–46.0)
Hemoglobin: 6.4 g/dL — ABNORMAL LOW (ref 12.0–15.0)
Hemoglobin: 6.8 g/dL — ABNORMAL LOW (ref 12.0–15.0)

## 2021-09-03 LAB — FERRITIN: Ferritin: 181 ng/mL (ref 11–307)

## 2021-09-03 LAB — VITAMIN B12: Vitamin B-12: 1281 pg/mL — ABNORMAL HIGH (ref 180–914)

## 2021-09-03 LAB — FOLATE: Folate: 12.7 ng/mL (ref >5.9)

## 2021-09-03 NOTE — TOC Progression Note (Signed)
Transition of Care [facility]) - Progression Note    Patient Details  Name: Dana Love MRN: [id number] Date of Birth: 04-05-1932  [facility] CM/SW Contact  Marlowe Sax, RN Phone Number: 09/03/2021, 11:06 AM  Clinical Narrative:    Called the son [name] to review the bed options for SNF, unable to reach, left a general voice mail asking for a call back        Expected Discharge Plan and Services                                                 Social Determinants of Health (SDOH) Interventions    Readmission Risk Interventions     No data to display

## 2021-09-03 NOTE — Plan of Care (Signed)

## 2021-09-03 NOTE — Progress Notes (Signed)
Progress Note   Patient: Dana Love [id number] DOB: 10-30-1932 DOA: 08/31/2021     3 DOS: the patient was seen and examined on 09/03/2021   Brief hospital course: Dana Love is a 88 y.o. female with medical history significant of dementia, osteoporosis, hyperlipidemia, right hip fracture in 2021 presented to the ED on 08/31/2021 after having an unwitnessed fall and subsequent pain in her left hip and numbness and tingling in the left leg and lower back.  Due to baseline dementia, patient was unable to provide history or details of any symptoms prior to the fall. When seen in the ED on admission, patient denied having any pain at the time.  She did not know that she is in the hospital or why she is here.  Son was at bedside and confirms this to be her cognitive baseline.   ED course --BP elevated 171/77, heart rate 59 otherwise normal vital signs. CMP notable only for glucose of 109, calcium 8.5. CBC notable only for mildly low platelets 134. Imaging --unremarkable CT head and CT cervical spine.  CT lumbar spine showed likely subacute or chronic bilateral sacral insufficiency fractures and chronic degenerative changes. Left hip x-ray showed an acute intertrochanteric fracture.   Orthopedic surgery was consulted and took patient to the OR for repair by intramedullary nailing. Admitted to the hospital for further management including pain control and PT/OT evaluation postoperatively  Assessment and Plan: * Closed intertrochanteric fracture of left femur [facility]) Patient had an unwitnessed fall at her ALF with subsequent left hip pain.  Patient has osteoporosis and prior history of right hip fracture in 2021.-- Orthopedic surgery consulted -- Underwent ORIF day of admission 8/10 -- PT OT -- Pain control -scheduled Tylenol, as needed tramadol or oxycodone, as needed Robaxin -- Bowel regimen -- DVT prophylaxis -Lovenox, TED hose, SCDs -- Weightbearing as tolerated on LLE -- Monitor  hemoglobin -- TOC following for anticipated SNF placement -- Fall precautions  Thrombocytopenia (HCC) Appears chronic.  Plt count 141 in December. This admission platelet trend 134 >> 126 >> 97 >> 109k.   --Hold Lovenox with severe anemia --Monitor CBC daily --Monitor for bleeding  ABLA (acute blood loss anemia) Hbg initially 12.7 on admission, trending down since surgery.  Hbg 8.9 >> 8.2 >> 6.7 >> 6.4 this AM. Left lateral thigh occlusive dressing clean, no surrounding swelling to suggest hematoma. --Goals of care discussion with son -- hold off blood transfusion and monitor for now.  Anticipate transition to comfort/hospice.  He declined CT scan to evaluate for internal bleeding.  --Repeat Hbg this afternoon --Monitor CBC --No signs of active bleeding --Hold Lovenix  Hyperlipidemia Continue Lipitor  Osteoporosis Continue on calcium and vitamin D supplements  Dementia without behavioral disturbance [facility]) Patient is a resident of [facility], ALF memory care.   --Continue Aricept, Zoloft --Delirium precautions         Subjective: Patient was sleeping and difficult to arouse this AM.  When she did awaken, she would say "leave me alone" and "go away".  Son and granddaughter at bedside report this is all she has said to them today.    We had extensive discussions about her anemia, no sign of bleeding.  Son/POA is leaning towards conservative and comfort measures, prefers not to be aggressive due to patient's decline quality of life prior to this fracture and hopsital admission.  He declines blood transfusion or CT scan to assess for internal bleeding. Asks to watch Hbg count and wants to meet  with palliative.  Confirmed DNR code status and clarified if anemia persists or worsens, pt could have cardiac arrest.  He expressed understanding.     Physical Exam: Vitals:   09/02/21 2005 09/03/21 0530 09/03/21 0746 09/03/21 1523  BP: 128/78 131/60 (!) 118/47 (!) 124/47  Pulse: 96  98 84 (!) 107  Resp: 20 20 14 18   Temp: 99 F (37.2 C) 98.6 F (37 C) 98.3 F (36.8 C) (!) 97.5 F (36.4 C)  TempSrc:      SpO2: 95% 96% 94% 95%  Weight:      Height:       General exam: sleeping soundly, wakes briefly, no acute distress HEENT: pale mucus membranes, hearing grossly normal  Respiratory system: lungs clear, on room air, normal respiratory effort. Cardiovascular system: RRR. Warm distal extremities, no pedal edema.   Central nervous system: unable to evaluate, pt minimally responsive, grossly non-focal Extremities: no edema, normal tone Skin: pale appearing, dry, intact, normal temperature   Data Reviewed:  Notable labs: Hbg 6.7 > 6.4.  Iron 22, TIBC 223, normal ferritin, sat ratio 9, immature retic fract 31.6, RBC 2.15, Retic ct pct 3.3  Family Communication: Son and his daughter were  at bedside on rounds this AM.  Updated son by phone this afternoon.    Disposition: Status is: Inpatient Remains inpatient appropriate because: Requires SNF placement for rehab, monitoring postop anemia   Planned Discharge Destination: Skilled nursing facility    Time spent: 55 minutes including time spent at bedside and in coordination of care.   Author: , DO 09/03/2021 6:12 PM  For on call review [email or web address].

## 2021-09-03 NOTE — Progress Notes (Signed)
  Subjective: 3 Days Post-Op Procedure(s) (LRB): INTRAMEDULLARY (IM) NAIL INTERTROCHANTERIC (Left) Patient is sleeping this AM. Son and granddaughter present today, report increased confusion from patient's baseline yesterday.   Objective: Vital signs in last 24 hours: Temp:  [98.3 F (36.8 C)-99 F (37.2 C)] 98.3 F (36.8 C) (08/13 0746) Pulse Rate:  [84-101] 84 (08/13 0746) Resp:  [14-20] 14 (08/13 0746) BP: (118-159)/(47-78) 118/47 (08/13 0746) SpO2:  [94 %-96 %] 94 % (08/13 0746)  Intake/Output from previous day:  Intake/Output Summary (Last 24 hours) at 09/03/2021 1038 Last data filed at 09/02/2021 1100 Gross per 24 hour  Intake --  Output 300 ml  Net -300 ml    Intake/Output this shift: No intake/output data recorded.  Labs: Recent Labs    09/01/21 0625 09/02/21 0522 09/03/21 0402 09/03/21 0934  HGB 8.9* 8.2* 6.7* 6.4*   Recent Labs    09/02/21 0522 09/03/21 0402 09/03/21 0934  WBC 11.6* 12.1*  --   RBC 2.74* 2.23* 2.15*  HCT 25.3* 20.3* 19.6*  PLT 97* 109*  --    Recent Labs    09/01/21 0625 09/02/21 0522  NA 136 135  K 4.0 3.8  CL 106 105  CO2 24 26  BUN 21 21  CREATININE 1.19* 0.81  GLUCOSE 119* 124*  CALCIUM 7.6* 8.1*   No results for input(s): "LABPT", "INR" in the last 72 hours.   EXAM General - Patient is  sleeping Extremity - Compartment soft Dressing/Incision -clean, dry, no drainage Motor Function - intact, moving foot and toes well on exam.    Assessment/Plan: 3 Days Post-Op Procedure(s) (LRB): INTRAMEDULLARY (IM) NAIL INTERTROCHANTERIC (Left) Principal Problem:   Closed intertrochanteric fracture of left femur (HCC) Active Problems:   Dementia without behavioral disturbance (HCC)   Osteoporosis   Hyperlipidemia   ABLA (acute blood loss anemia)   Thrombocytopenia (HCC)  Estimated body mass index is 26.46 kg/m as calculated from the following:   Height as of this encounter: 4\' 11"  (1.499 m).   Weight as of this  encounter: 59.4 kg. Advance diet Up with therapy  Labs reviewed, Hg 6.7 from 8.2. Repeat Hg is 6.4. Dr at bedside spoke with patient's family regarding transfusion.   DVT Prophylaxis - Ted hose and SCDs Weight-Bearing as tolerated to left leg  Denton Lank, PA-C [facility] 09/03/2021, 10:38 AM

## 2021-09-03 NOTE — Progress Notes (Signed)
Physical Therapy Treatment Patient Details Name: Dana Love MRN: [id number] DOB: 1932-06-17 Today's Date: 09/03/2021   History of Present Illness Dana Love is an 88 y.o. female with a history of significant dementia who had a fall earlier today.  The patient noted immediate hip pain and inability to ambulate. S/p L femur IM nail on 08/31/21.    PT Comments    Pt seen for PT tx with son & granddaughter present for session, nursing staff in room. Pt asleep but verbally communicating at times during session. PT & NT provided total assist +2 for supine<>sit to attempt to increase alertness & encourage pt to take morning meds from nurse. Pt requires max assist for static sitting EOB, with pt demonstrating R/posterior lean, even pushing back at times with no attempts to correct. Pt refused meds many times & ultimately assisted back supine. Discussed recovery with pt's family & notified MD of family's concerns re: CLOF & recovery with MD reporting she plans to discuss with family.     Recommendations for follow up therapy are one component of a multi-disciplinary discharge planning process, led by the attending physician.  Recommendations may be updated based on patient status, additional functional criteria and insurance authorization.  Follow Up Recommendations  Skilled nursing-short term rehab (<3 hours/day) Can patient physically be transported by private vehicle: No   Assistance Recommended at Discharge Frequent or constant Supervision/Assistance  Patient can return home with the following Two people to help with walking and/or transfers;Assist for transportation;Help with stairs or ramp for entrance;Two people to help with bathing/dressing/bathroom   Equipment Recommendations   (TBD in next venue)    Recommendations for Other Services       Precautions / Restrictions Precautions Precautions: Fall Restrictions Weight Bearing Restrictions: Yes LLE Weight Bearing: Weight bearing  as tolerated     Mobility  Bed Mobility   Bed Mobility: Supine to Sit, Sit to Supine     Supine to sit: Total assist, +2 for physical assistance, HOB elevated Sit to supine: Total assist, +2 for physical assistance, HOB elevated        Transfers                        Ambulation/Gait                   Stairs             Wheelchair Mobility    Modified Rankin (Stroke Patients Only)       Balance Overall balance assessment: Needs assistance Sitting-balance support: Feet supported Sitting balance-Leahy Scale: Zero Sitting balance - Comments: pt with little effort to hold self upright Postural control: Posterior lean, Right lateral lean                                  Cognition Arousal/Alertness: Lethargic                                     General Comments: Pt keeps eyes closed throughout majority of session, states "you would make a preacher curse", requesting to lie down, declines taking medication        Exercises      General Comments        Pertinent Vitals/Pain Pain Assessment Pain Assessment: Faces Faces Pain Scale: Hurts whole lot Pain  Location: L hip Pain Descriptors / Indicators: Grimacing, Guarding Pain Intervention(s): Repositioned, Monitored during session    Home Living                          Prior Function            PT Goals (current goals can now be found in the care plan section) Acute Rehab PT Goals Patient Stated Goal: decrease pain PT Goal Formulation: With patient/family Time For Goal Achievement: 09/15/21 Potential to Achieve Goals: Fair Progress towards PT goals: PT to reassess next treatment    Frequency    7X/week      PT Plan Current plan remains appropriate    Co-evaluation              AM-PAC PT "6 Clicks" Mobility   Outcome Measure  Help needed turning from your back to your side while in a flat bed without using bedrails?:  Total Help needed moving from lying on your back to sitting on the side of a flat bed without using bedrails?: Total Help needed moving to and from a bed to a chair (including a wheelchair)?: Total Help needed standing up from a chair using your arms (e.g., wheelchair or bedside chair)?: Total Help needed to walk in hospital room?: Total Help needed climbing 3-5 steps with a railing? : Total 6 Click Score: 6    End of Session   Activity Tolerance: Patient limited by pain;Patient limited by fatigue Patient left: in bed;with call bell/phone within reach;with bed alarm set;with SCD's reapplied   PT Visit Diagnosis: Other abnormalities of gait and mobility (R26.89);Pain;Muscle weakness (generalized) (M62.81);Difficulty in walking, not elsewhere classified (R26.2) Pain - Right/Left: Left Pain - part of body: Hip     Time: 1001-1018 PT Time Calculation (min) (ACUTE ONLY): 17 min  Charges:  $Therapeutic Activity: 8-22 mins                     Aleda Grana, PT, DPT 09/03/21, 10:27 AM   Sandi Mariscal 09/03/2021, 10:24 AM

## 2021-09-03 NOTE — TOC Progression Note (Signed)
Transition of Care [facility]) - Progression Note    Patient Details  Name: Dana Love MRN: [id number] Date of Birth: Jun 13, 1932  Transition of Care [facility]) CM/SW Contact  Marlowe Sax, RN Phone Number: 09/03/2021, 3:04 PM  Clinical Narrative:     Spoke with the son [name], He stated that he was told he had plenty of time to decide what to do and will discuss SNF needs tomorrow I let him know I will call him back tomorrow       Expected Discharge Plan and Services                                                 Social Determinants of Health (SDOH) Interventions    Readmission Risk Interventions     No data to display

## 2021-09-04 DIAGNOSIS — F0390 Unspecified dementia without behavioral disturbance: Secondary | ICD-10-CM | POA: Diagnosis not present

## 2021-09-04 DIAGNOSIS — D72829 Elevated white blood cell count, unspecified: Secondary | ICD-10-CM

## 2021-09-04 DIAGNOSIS — S72145A Nondisplaced intertrochanteric fracture of left femur, initial encounter for closed fracture: Secondary | ICD-10-CM | POA: Diagnosis not present

## 2021-09-04 DIAGNOSIS — E876 Hypokalemia: Secondary | ICD-10-CM | POA: Diagnosis not present

## 2021-09-04 DIAGNOSIS — D62 Acute posthemorrhagic anemia: Secondary | ICD-10-CM | POA: Diagnosis not present

## 2021-09-04 LAB — BASIC METABOLIC PANEL
Anion gap: 7 (ref 5–15)
BUN: 27 mg/dL — ABNORMAL HIGH (ref 8–23)
CO2: 25 mmol/L (ref 22–32)
Calcium: 7.8 mg/dL — ABNORMAL LOW (ref 8.9–10.3)
Chloride: 103 mmol/L (ref 98–111)
Creatinine, Ser: 0.79 mg/dL (ref 0.44–1.00)
GFR, Estimated: >60 mL/min (ref >60)
Glucose, Bld: 138 mg/dL — ABNORMAL HIGH (ref 70–99)
Potassium: 3.3 mmol/L — ABNORMAL LOW (ref 3.5–5.1)
Sodium: 135 mmol/L (ref 135–145)

## 2021-09-04 LAB — URINALYSIS, ROUTINE W REFLEX MICROSCOPIC
Bacteria, UA: NONE SEEN
Bilirubin Urine: NEGATIVE
Glucose, UA: NEGATIVE mg/dL
Hgb urine dipstick: NEGATIVE
Ketones, ur: NEGATIVE mg/dL
Nitrite: NEGATIVE
Protein, ur: 30 mg/dL — AB
Specific Gravity, Urine: 1.031 — ABNORMAL HIGH (ref 1.005–1.030)
pH: 5 (ref 5.0–8.0)

## 2021-09-04 LAB — CBC
HCT: 18.9 % — ABNORMAL LOW (ref 36.0–46.0)
Hemoglobin: 6.3 g/dL — ABNORMAL LOW (ref 12.0–15.0)
MCH: 30.6 pg (ref 26.0–34.0)
MCHC: 33.3 g/dL (ref 30.0–36.0)
MCV: 91.7 fL (ref 80.0–100.0)
Platelets: 167 10*3/uL (ref 150–400)
RBC: 2.06 MIL/uL — ABNORMAL LOW (ref 3.87–5.11)
RDW: 14.3 % (ref 11.5–15.5)
WBC: 15.4 10*3/uL — ABNORMAL HIGH (ref 4.0–10.5)
nRBC: 0.3 % — ABNORMAL HIGH (ref 0.0–0.2)

## 2021-09-04 LAB — HEMOGLOBIN AND HEMATOCRIT, BLOOD
HCT: 23.4 % — ABNORMAL LOW (ref 36.0–46.0)
Hemoglobin: 7.6 g/dL — ABNORMAL LOW (ref 12.0–15.0)

## 2021-09-04 LAB — PREPARE RBC (CROSSMATCH)

## 2021-09-04 MED ORDER — OXYCODONE HCL 5 MG PO TABS: 2.5000 mg | ORAL_TABLET | ORAL | 0 refills | Status: DC | PRN

## 2021-09-04 MED ORDER — POTASSIUM CHLORIDE CRYS ER 20 MEQ PO TBCR
40.0000 meq | EXTENDED_RELEASE_TABLET | Freq: Once | ORAL | Status: AC
Start: 2021-09-04 — End: 2021-09-04
  Administered 2021-09-04: 40 meq via ORAL
  Filled 2021-09-04: qty 2

## 2021-09-04 MED ORDER — ONDANSETRON HCL 4 MG PO TABS: 4.0000 mg | ORAL_TABLET | Freq: Four times a day (QID) | ORAL | 0 refills | Status: AC | PRN

## 2021-09-04 MED ORDER — SODIUM CHLORIDE 0.9% IV SOLUTION: Freq: Once | INTRAVENOUS | Status: AC

## 2021-09-04 MED ORDER — TRAMADOL HCL 50 MG PO TABS: 50.0000 mg | ORAL_TABLET | Freq: Four times a day (QID) | ORAL | 0 refills | Status: DC | PRN

## 2021-09-04 NOTE — Progress Notes (Signed)
Physical Therapy Treatment Patient Details Name: Dana Love MRN: [id number] DOB: Sep 19, 1932 Today's Date: 09/04/2021   History of Present Illness Dana Love is an 88 y.o. female with a history of significant dementia who had a fall earlier today.  The patient noted immediate hip pain and inability to ambulate. S/p L femur IM nail on 08/31/21.    PT Comments    Pt cleared by MD for PT tx. Pt received sitting in recliner, more alert on this date & agreeable to tx. Pt engages in LLE strengthening exercises, still endorsing L hip pain with movement. Notified pt's son of activities of session as he was in hallway. Will continue to follow pt acutely to address balance, strengthening, and gait with LRAD.     Recommendations for follow up therapy are one component of a multi-disciplinary discharge planning process, led by the attending physician.  Recommendations may be updated based on patient status, additional functional criteria and insurance authorization.  Follow Up Recommendations  Skilled nursing-short term rehab (<3 hours/day) Can patient physically be transported by private vehicle: No   Assistance Recommended at Discharge Frequent or constant Supervision/Assistance  Patient can return home with the following Two people to help with walking and/or transfers;Assist for transportation;Help with stairs or ramp for entrance;Two people to help with bathing/dressing/bathroom   Equipment Recommendations  None recommended by PT    Recommendations for Other Services       Precautions / Restrictions Precautions Precautions: Fall Restrictions Weight Bearing Restrictions: Yes LLE Weight Bearing: Weight bearing as tolerated     Mobility  Bed Mobility               General bed mobility comments: pt received & left sitting in recliner    Transfers                        Ambulation/Gait                   Stairs             Wheelchair  Mobility    Modified Rankin (Stroke Patients Only)       Balance                                            Cognition Arousal/Alertness: Awake/alert Behavior During Therapy: WFL for tasks assessed/performed                     Orientation Level: Disoriented to, Situation, Time, Place   Memory: Decreased short-term memory Following Commands: Follows one step commands with increased time Safety/Judgement: Decreased awareness of safety, Decreased awareness of deficits Awareness: Intellectual   General Comments: Pt much more alert & engaged today, agreeable to chair level exercises. Does endorse feeling lousy but doesn't elaborate.        Exercises General Exercises - Lower Extremity Short Arc Quad: AROM, Strengthening, Left, 10 reps Long Arc Quad: AROM, Strengthening, Left, 10 reps, Seated Heel Slides: AAROM, Strengthening, Left, 10 reps Hip ABduction/ADduction: AAROM, Strengthening, Left, 10 reps (hip adduction pillow squeezes x 10, hip abduction slides x 10) Hip Flexion/Marching: AROM, Strengthening, Left, 10 reps, Seated    General Comments        Pertinent Vitals/Pain Pain Assessment Pain Assessment: Faces Faces Pain Scale: Hurts little more Pain Location: L elbow, L hip with some  movements Pain Descriptors / Indicators: Grimacing, Guarding Pain Intervention(s): Monitored during session, Repositioned    Home Living                          Prior Function            PT Goals (current goals can now be found in the care plan section) Acute Rehab PT Goals Patient Stated Goal: decrease pain PT Goal Formulation: With patient/family Time For Goal Achievement: 09/15/21 Potential to Achieve Goals: Fair Progress towards PT goals: Progressing toward goals    Frequency    7X/week      PT Plan Current plan remains appropriate    Co-evaluation              AM-PAC PT "6 Clicks" Mobility   Outcome Measure  Help  needed turning from your back to your side while in a flat bed without using bedrails?: A Lot Help needed moving from lying on your back to sitting on the side of a flat bed without using bedrails?: A Lot Help needed moving to and from a bed to a chair (including a wheelchair)?: A Lot Help needed standing up from a chair using your arms (e.g., wheelchair or bedside chair)?: A Lot Help needed to walk in hospital room?: A Lot Help needed climbing 3-5 steps with a railing? : Total 6 Click Score: 11    End of Session   Activity Tolerance: Patient tolerated treatment well Patient left: in chair;with chair alarm set;with call bell/phone within reach Nurse Communication: Mobility status PT Visit Diagnosis: Other abnormalities of gait and mobility (R26.89);Pain;Muscle weakness (generalized) (M62.81);Difficulty in walking, not elsewhere classified (R26.2) Pain - Right/Left: Left Pain - part of body: Hip     Time: 9485-4627 PT Time Calculation (min) (ACUTE ONLY): 9 min  Charges:  $Therapeutic Exercise: 8-22 mins                     Aleda Grana, PT, DPT 09/04/21, 11:37 AM    Sandi Mariscal 09/04/2021, 11:36 AM

## 2021-09-04 NOTE — Progress Notes (Signed)
  Subjective: 4 Days Post-Op Procedure(s) (LRB): INTRAMEDULLARY (IM) NAIL INTERTROCHANTERIC (Left) Patient reports pain as well-controlled.  Awake and alert this morning.  Mild soreness.  Slept well.  Confusion. Patient is well, and has had no acute complaints or problems Plan is to go Skilled nursing facility after hospital stay.  Objective: Vital signs in last 24 hours: Temp:  [97.5 F (36.4 C)-98.9 F (37.2 C)] 98.2 F (36.8 C) (08/14 0425) Pulse Rate:  [83-107] 92 (08/14 0425) Resp:  [14-20] 18 (08/14 0425) BP: (112-132)/(47-66) 112/52 (08/14 0425) SpO2:  [94 %-97 %] 94 % (08/14 0425)  Intake/Output from previous day: No intake or output data in the 24 hours ending 09/04/21 0708  Intake/Output this shift: No intake/output data recorded.  Labs: Recent Labs    09/02/21 0522 09/03/21 0402 09/03/21 0934 09/03/21 1608 09/04/21 0350  HGB 8.2* 6.7* 6.4* 6.8* 6.3*   Recent Labs    09/03/21 0402 09/03/21 0934 09/03/21 1608 09/04/21 0350  WBC 12.1*  --   --  15.4*  RBC 2.23* 2.15*  --  2.06*  HCT 20.3* 19.6* 21.2* 18.9*  PLT 109*  --   --  167   Recent Labs    09/02/21 0522 09/04/21 0350  NA 135 135  K 3.8 3.3*  CL 105 103  CO2 26 25  BUN 21 27*  CREATININE 0.81 0.79  GLUCOSE 124* 138*  CALCIUM 8.1* 7.8*   No results for input(s): "LABPT", "INR" in the last 72 hours.    EXAM General - Patient is  oriented to person only, pleasantly demented  Extremity - Neurovascular intact Dorsiflexion/Plantar flexion intact Compartment soft Dressing/Incision -clean, dry, scant proximal drainage Motor Function - intact, moving foot and toes well on exam.  Limited ambulation.   Assessment/Plan: 4 Days Post-Op Procedure(s) (LRB): INTRAMEDULLARY (IM) NAIL INTERTROCHANTERIC (Left) Principal Problem:   Closed intertrochanteric fracture of left femur (HCC) Active Problems:   Dementia without behavioral disturbance (HCC)   Osteoporosis   Hyperlipidemia   ABLA (acute  blood loss anemia)   Thrombocytopenia (HCC)  Estimated body mass index is 26.46 kg/m as calculated from the following:   Height as of this encounter: 4\' 11"  (1.499 m).   Weight as of this encounter: 59.4 kg. Advance diet Up with therapy  Hg 6.3 this AM, will continue to monitor per medicine   DVT Prophylaxis - Lovenox, Ted hose, and SCDs Weight-Bearing as tolerated to left leg  , PA-C [facility] 09/04/2021, 7:08 AM

## 2021-09-04 NOTE — Care Management Important Message (Signed)
Important Message  Patient Details  Name: Dana Love MRN: [id number] Date of Birth: 07-24-1932   Medicare Important Message Given:  N/A - LOS <3 / Initial given by admissions     Olegario Messier A Vana Arif 09/04/2021, 9:48 AM

## 2021-09-04 NOTE — Assessment & Plan Note (Signed)
WBC trended up past few days, 15.4k today. Pt mental status waxes and wanes.  No fevers. --Check UA --Monitor CBC --Monitor for s/sx's of infection

## 2021-09-04 NOTE — Discharge Instructions (Addendum)
INSTRUCTIONS AFTER Surgery  Remove items at home which could result in a fall. This includes throw rugs or furniture in walking pathways ICE to the affected joint every three hours while awake for 30 minutes at a time, for at least the first 3-5 days, and then as needed for pain and swelling.  Continue to use ice for pain and swelling. You may notice swelling that will progress down to the foot and ankle.  This is normal after surgery.  Elevate your leg when you are not up walking on it.   Continue to use the breathing machine you got in the hospital (incentive spirometer) which will help keep your temperature down.  It is common for your temperature to cycle up and down following surgery, especially at night when you are not up moving around and exerting yourself.  The breathing machine keeps your lungs expanded and your temperature down.   DIET:  As you were doing prior to hospitalization, we recommend a well-balanced diet.  DRESSING / WOUND CARE / SHOWERING  Dressing change as needed.  No showering until staples removed.  Staples will be removed in 2 weeks at [facility] clinic orthopedics.  ACTIVITY  Increase activity slowly as tolerated, but follow the weight bearing instructions below.   No driving for 6 weeks or until further direction given by your physician.  You cannot drive while taking narcotics.  No lifting or carrying greater than 10 lbs. until further directed by your surgeon. Avoid periods of inactivity such as sitting longer than an hour when not asleep. This helps prevent blood clots.  You may return to work once you are authorized by your doctor.     WEIGHT BEARING  Weightbearing as tolerated on the left   EXERCISES Gait training and ambulation training.  Gentle range of motion.  CONSTIPATION  Constipation is defined medically as fewer than three stools per week and severe constipation as less than one stool per week.  Even if you have a regular bowel pattern at home,  your normal regimen is likely to be disrupted due to multiple reasons following surgery.  Combination of anesthesia, postoperative narcotics, change in appetite and fluid intake all can affect your bowels.   YOU MUST use at least one of the following options; they are listed in order of increasing strength to get the job done.  They are all available over the counter, and you may need to use some, POSSIBLY even all of these options:    Drink plenty of fluids (prune juice may be helpful) and high fiber foods Colace 100 mg by mouth twice a day  Senokot for constipation as directed and as needed Dulcolax (bisacodyl), take with full glass of water  Miralax (polyethylene glycol) once or twice a day as needed.  If you have tried all these things and are unable to have a bowel movement in the first 3-4 days after surgery call either your surgeon or your primary doctor.    If you experience loose stools or diarrhea, hold the medications until you stool forms back up.  If your symptoms do not get better within 1 week or if they get worse, check with your doctor.  If you experience "the worst abdominal pain ever" or develop nausea or vomiting, please contact the office immediately for further recommendations for treatment.   ITCHING:  If you experience itching with your medications, try taking only a single pain pill, or even half a pain pill at a time.  You can  also use Benadryl over the counter for itching or also to help with sleep.   TED HOSE STOCKINGS:  Use stockings on both legs until for at least 2 weeks or as directed by physician office. They may be removed at night for sleeping.  MEDICATIONS:  See your medication summary on the "After Visit Summary" that nursing will review with you.  You may have some home medications which will be placed on hold until you complete the course of blood thinner medication.  It is important for you to complete the blood thinner medication as  prescribed.  PRECAUTIONS:  If you experience chest pain or shortness of breath - call 911 immediately for transfer to the hospital emergency department.   If you develop a fever greater that 101 F, purulent drainage from wound, increased redness or drainage from wound, foul odor from the wound/dressing, or calf pain - CONTACT YOUR SURGEON.                                                   FOLLOW-UP APPOINTMENTS:  If you do not already have a post-op appointment, please call the office for an appointment to be seen by your surgeon.  Guidelines for how soon to be seen are listed in your "After Visit Summary", but are typically between 1-4 weeks after surgery.  OTHER INSTRUCTIONS:     MAKE SURE YOU:  Understand these instructions.  Get help right away if you are not doing well or get worse.    Thank you for letting us be a part of your medical care team.  It is a privilege we respect greatly.  We hope these instructions will help you stay on track for a fast and full recovery!

## 2021-09-04 NOTE — TOC Progression Note (Signed)
Transition of Care [facility]) - Progression Note    Patient Details  Name: Dana Love MRN: [id number] Date of Birth: Oct 01, 1932  [facility] CM/SW Contact  Marlowe Sax, RN Phone Number: 09/04/2021, 9:14 AM  Clinical Narrative:     Resent the bed search hoping to get optins today will review with the son once obtained       Expected Discharge Plan and Services                                                 Social Determinants of Health (SDOH) Interventions    Readmission Risk Interventions     No data to display

## 2021-09-04 NOTE — Progress Notes (Signed)
Progress Note   Patient: Dana [facility] WGN:[id number] DOB: 04-30-1932 DOA: 08/31/2021     4 DOS: the patient was seen and examined on 09/04/2021   Brief hospital course: Dana Love is a 88 y.o. female with medical history significant of dementia, osteoporosis, hyperlipidemia, right hip fracture in 2021 presented to the ED on 08/31/2021 after having an unwitnessed fall and subsequent pain in her left hip and numbness and tingling in the left leg and lower back.  Due to baseline dementia, patient was unable to provide history or details of any symptoms prior to the fall. When seen in the ED on admission, patient denied having any pain at the time.  She did not know that she is in the hospital or why she is here.  Son was at bedside and confirms this to be her cognitive baseline.   ED course --BP elevated 171/77, heart rate 59 otherwise normal vital signs. CMP notable only for glucose of 109, calcium 8.5. CBC notable only for mildly low platelets 134. Imaging --unremarkable CT head and CT cervical spine.  CT lumbar spine showed likely subacute or chronic bilateral sacral insufficiency fractures and chronic degenerative changes. Left hip x-ray showed an acute intertrochanteric fracture.   Orthopedic surgery was consulted and took patient to the OR for repair by intramedullary nailing. Admitted to the hospital for further management including pain control and PT/OT evaluation postoperatively  Assessment and Plan: * Closed intertrochanteric fracture of left femur [facility]) Patient had an unwitnessed fall at her ALF with subsequent left hip pain.  Patient has osteoporosis and prior history of right hip fracture in 2021.-- Orthopedic surgery consulted -- Underwent ORIF day of admission 8/10 -- PT OT -- Pain control -scheduled Tylenol, as needed tramadol or oxycodone, as needed Robaxin -- Bowel regimen -- DVT prophylaxis -Lovenox, TED hose, SCDs -- Weightbearing as tolerated on LLE -- Monitor  hemoglobin -- TOC following for anticipated SNF placement -- Fall precautions  Hypokalemia K 3.3, replace with oral KCl 40 mEq.  Monitor and replace as needed.  Leukocytosis WBC trended up past few days, 15.4k today. Pt mental status waxes and wanes.  No fevers. --Check UA --Monitor CBC --Monitor for s/sx's of infection  Thrombocytopenia (HCC) Appears chronic.  Plt count 141 in December. This admission platelet trend 134 >> 126 >> 97 >> 109k.   --Hold Lovenox with severe anemia --Monitor CBC daily --Monitor for bleeding  ABLA (acute blood loss anemia) Hbg initially 12.7 on admission, trending down since surgery.   Hbg 6.8 >> 6.3 this AM. Left lateral thigh occlusive dressing clean, no surrounding swelling to suggest hematoma. --Son agreeable to blood transfusion and iron infusion --pRBC transfuse 1 unit today --Iron infusion tomorrow --Post-transfusion H&H --Monitor CBC --No signs of active bleeding, monitor --Hold Lovenix  Hyperlipidemia Continue Lipitor  Osteoporosis Continue on calcium and vitamin D supplements  Dementia without behavioral disturbance [facility]) [facility] is a resident of [facility] memory care.   --Continue Aricept, Zoloft --Delirium precautions         Subjective: Patient was sitting in recliner, eyes closed but awake and responds to questions.  Son at bedside, discussed her anemia, he is agreeable to blood transfusion.  Pt denies pain or any other complaints.  She repeatedly asked what we were talking about.  Son wonders if she has a UTI with her waxing/waning mental status.    Physical Exam: Vitals:   09/04/21 0734 09/04/21 1410 09/04/21 1436 09/04/21 1438  BP: (!) 114/46 (!) 128/54 Marland Kitchen)  141/67 (!) 141/67  Pulse: 86 92 90 90  Resp: 16 18 18 16   Temp: 98.6 F (37 C) 98.7 F (37.1 C) 97.7 F (36.5 C) 97.8 F (36.6 C)  TempSrc:  Oral Oral   SpO2: 96% 95% 99% 99%  Weight:      Height:       General exam: awake in recliner but  keeps eyes closed, no acute distress HEENT: pale mucus membranes, hearing grossly normal  Respiratory system: lungs clear, on room air, normal respiratory effort. Cardiovascular system: RRR. Warm distal extremities, no pedal edema.   Central nervous system: normal speech, grossly non-focal Extremities: no edema, normal tone Skin: pale appearing, dry, intact, normal temperature   Data Reviewed:  Notable labs: Hbg 6.3, K 3.3, glucose 138, BUN 27. Ca 7.8, WBC 15.4  Family Communication: Son at bedside on rounds today  Disposition: Status is: Inpatient Remains inpatient appropriate because: Anemia requiring blood transfusion and monitoring for stability. Requires SNF placement for rehab   Planned Discharge Destination: Skilled nursing facility    Time spent: 45 minutes including time spent at bedside and in coordination of care.   Author: , DO 09/04/2021 3:33 PM  For on call review [email or web address].

## 2021-09-04 NOTE — Assessment & Plan Note (Signed)
Resolved with replacement for K 3.3. Monitor and replace as needed.

## 2021-09-05 DIAGNOSIS — Z66 Do not resuscitate: Secondary | ICD-10-CM

## 2021-09-05 DIAGNOSIS — S72145A Nondisplaced intertrochanteric fracture of left femur, initial encounter for closed fracture: Secondary | ICD-10-CM | POA: Diagnosis not present

## 2021-09-05 DIAGNOSIS — D62 Acute posthemorrhagic anemia: Secondary | ICD-10-CM | POA: Diagnosis not present

## 2021-09-05 DIAGNOSIS — F0390 Unspecified dementia without behavioral disturbance: Secondary | ICD-10-CM | POA: Diagnosis not present

## 2021-09-05 DIAGNOSIS — Z515 Encounter for palliative care: Secondary | ICD-10-CM

## 2021-09-05 DIAGNOSIS — W19XXXA Unspecified fall, initial encounter: Secondary | ICD-10-CM | POA: Diagnosis not present

## 2021-09-05 LAB — CBC
HCT: 22.8 % — ABNORMAL LOW (ref 36.0–46.0)
Hemoglobin: 7.4 g/dL — ABNORMAL LOW (ref 12.0–15.0)
MCH: 28.6 pg (ref 26.0–34.0)
MCHC: 32.5 g/dL (ref 30.0–36.0)
MCV: 88 fL (ref 80.0–100.0)
Platelets: 157 10*3/uL (ref 150–400)
RBC: 2.59 MIL/uL — ABNORMAL LOW (ref 3.87–5.11)
RDW: 17.8 % — ABNORMAL HIGH (ref 11.5–15.5)
WBC: 11.2 10*3/uL — ABNORMAL HIGH (ref 4.0–10.5)
nRBC: 0.5 % — ABNORMAL HIGH (ref 0.0–0.2)

## 2021-09-05 LAB — TYPE AND SCREEN
ABO/RH(D): A POS
Antibody Screen: NEGATIVE
Unit division: 0

## 2021-09-05 LAB — BPAM RBC
Blood Product Expiration Date: 202308192359
ISSUE DATE / TIME: 202308141417
Unit Type and Rh: 600

## 2021-09-05 LAB — BASIC METABOLIC PANEL WITH GFR
Anion gap: 6 (ref 5–15)
BUN: 23 mg/dL (ref 8–23)
CO2: 24 mmol/L (ref 22–32)
Calcium: 7.6 mg/dL — ABNORMAL LOW (ref 8.9–10.3)
Chloride: 106 mmol/L (ref 98–111)
Creatinine, Ser: 0.59 mg/dL (ref 0.44–1.00)
GFR, Estimated: >60 mL/min
Glucose, Bld: 121 mg/dL — ABNORMAL HIGH (ref 70–99)
Potassium: 3.9 mmol/L (ref 3.5–5.1)
Sodium: 136 mmol/L (ref 135–145)

## 2021-09-05 LAB — HEMOGLOBIN AND HEMATOCRIT, BLOOD
HCT: 23.4 % — ABNORMAL LOW (ref 36.0–46.0)
Hemoglobin: 7.6 g/dL — ABNORMAL LOW (ref 12.0–15.0)

## 2021-09-05 LAB — MAGNESIUM: Magnesium: 2.4 mg/dL (ref 1.7–2.4)

## 2021-09-05 NOTE — Plan of Care (Signed)
  Problem: Clinical Measurements: Goal: Ability to maintain clinical measurements within normal limits will improve Outcome: Progressing Goal: Will remain free from infection Outcome: Progressing Goal: Diagnostic test results will improve Outcome: Progressing Goal: Cardiovascular complication will be avoided Outcome: Progressing   Problem: Activity: Goal: Risk for activity intolerance will decrease Outcome: Progressing   Problem: Nutrition: Goal: Adequate nutrition will be maintained Outcome: Progressing   Problem: Elimination: Goal: Will not experience complications related to urinary retention Outcome: Progressing   Problem: Pain Managment: Goal: General experience of comfort will improve Outcome: Progressing   Problem: Skin Integrity: Goal: Risk for impaired skin integrity will decrease Outcome: Progressing

## 2021-09-05 NOTE — Plan of Care (Signed)
°  Problem: Clinical Measurements: °Goal: Ability to maintain clinical measurements within normal limits will improve °Outcome: Progressing °Goal: Will remain free from infection °Outcome: Progressing °Goal: Diagnostic test results will improve °Outcome: Progressing °Goal: Respiratory complications will improve °Outcome: Progressing °Goal: Cardiovascular complication will be avoided °Outcome: Progressing °  °Problem: Activity: °Goal: Risk for activity intolerance will decrease °Outcome: Progressing °  °Problem: Nutrition: °Goal: Adequate nutrition will be maintained °Outcome: Progressing °  °Problem: Coping: °Goal: Level of anxiety will decrease °Outcome: Progressing °  °Problem: Elimination: °Goal: Will not experience complications related to bowel motility °Outcome: Progressing °Goal: Will not experience complications related to urinary retention °Outcome: Progressing °  °Problem: Pain Managment: °Goal: General experience of comfort will improve °Outcome: Progressing °  °Problem: Safety: °Goal: Ability to remain free from injury will improve °Outcome: Progressing °  °

## 2021-09-05 NOTE — Care Management Important Message (Signed)
Important Message  Patient Details  Name: ANWAR CRILL MRN: [id number] Date of Birth: May 26, 1932   Medicare Important Message Given:  Other (see comment)  I left a message [phone]) for the patient's son, Jaeleigh Monaco to call me at his convenience so I could review the Important Message from Medicare with him. I will await a return call.   Olegario Messier A Evadene Wardrip 09/05/2021, 2:34 PM

## 2021-09-05 NOTE — Consult Note (Signed)
Consultation Note Date: 09/05/2021   Patient Name: Dana Love  DOB: January 03, 1933  MRN: [id number]  Age / Sex: 88 y.o., female  PCP: Dana Harrier, MD Referring Physician: Ezekiel Slocumb, DO  Reason for Consultation: Establishing goals of care  HPI/Patient Profile: 88 y.o. female  with past medical history of dementia, osteoporosis, HLD, and right hip fracture (2021) admitted on 08/31/2021 with unwitnessed fall at her assisted living facility [facility]).  Patient underwent left ORIF with IM nail placement on 8/10.  Patient has experienced post-op anemia (as low as 6.3) and received blood transfusions with improvement to 7.4 today, 8/15.  Palliative medicine team was consulted to discuss goals of care.   Clinical Assessment and Goals of Care: I have reviewed medical records including EPIC notes, labs and imaging, assessed the patient and then met with patient and her son [facility] at bedside to discuss diagnosis prognosis, [place], EOL wishes, disposition and options.  Patient has significant short-term memory loss/dementia and is unable to participate in complex medical decision-making.  She listens attentively to my discussion with her son and was in agreement with his decisions and discussion of her plan of care.  I introduced Palliative Medicine as specialized medical care for people living with serious illness. It focuses on providing relief from the symptoms and stress of a serious illness. The goal is to improve quality of life for both the patient and the family.  We discussed a brief life review of the patient.  Patient is a widow and has 1 son.  She worked as a Careers information officer at [employer].  We discussed patient's current illness and what it means in the larger context of patient's on-going co-morbidities.  I attempted to elicit values and goals of care important to the patient.  [facility] shares  that he does not want her to be put on a ventilator or to suffer in any way.  We discussed that she already has a DNR in place.  Reviewed MOST form.  I completed a MOST form today. The patient and family outlined their wishes for the following treatment decisions:  Cardiopulmonary Resuscitation: Do Not Attempt Resuscitation (DNR/No CPR)  Medical Interventions: Comfort Measures: Keep clean, warm, and dry. Use medication by any route, positioning, wound care, and other measures to relieve pain and suffering. Use oxygen, suction and manual treatment of airway obstruction as needed for comfort. Do not transfer to the hospital unless comfort needs cannot be met in current location.  Antibiotics: Antibiotics if indicated  IV Fluids: No IV fluids (provide other measures to ensure comfort)  Feeding Tube: No feeding tube    Can copy was placed in shadow chart and copy of MOST form given to patient's son.  Discussed nature of dementia as a progressive disease.  [facility] understands that his mom has had a significant decline in her memory and recall over the past 2 years.  He is appropriately aware that she will likely continue to have cognitive, nutritional, and functional decline.  However, he and  patient are both hopeful that she will be able to participate in rehab and optimize as much level of functioning and mobility as possible.   Discussed with patient/family the importance of continued conversation with family and the medical providers regarding overall plan of care and treatment options, ensuring decisions are within the context of the patient's values and GOCs.    Palliative Care services outpatient were explained and offered.  Reviewed that outpatient palliative services provided an extra layer of support for symptom management and goals of care discussions.  Palliative medicine can also help facilitate transition to hospice if and when that time comes.  [facility] was appreciative and in agreement to  referral.   Questions and concerns were addressed. The family was encouraged to call with questions or concerns.   Primary Decision Maker NEXT OF KIN  Code Status/Advance Care Planning: DNR  Prognosis:   Unable to determine  Discharge Planning: [facility] for rehab with Palliative care service follow-up  Primary Diagnoses: Present on Admission:  Closed intertrochanteric fracture of left femur ([place])  Dementia without behavioral disturbance ([place])  Osteoporosis  Hyperlipidemia  Thrombocytopenia ([place])   Physical Exam Vitals reviewed.  Constitutional:      Appearance: Normal appearance.  HENT:     Head: Normocephalic.     Mouth/Throat:     Mouth: Mucous membranes are moist.  Eyes:     Pupils: Pupils are equal, round, and reactive to light.  Cardiovascular:     Rate and Rhythm: Normal rate.     Pulses: Normal pulses.  Pulmonary:     Effort: Pulmonary effort is normal.  Abdominal:     Palpations: Abdomen is soft.  Musculoskeletal:     Comments: Limited LLE  Skin:    General: Skin is warm and dry.  Neurological:     Mental Status: She is alert. Mental status is at baseline.  Psychiatric:        Mood and Affect: Mood normal.        Behavior: Behavior normal.        Thought Content: Thought content normal.        Judgment: Judgment normal.     Palliative Assessment/Data: 50%     Thank you for this consult. Palliative medicine will continue to follow and assist holistically.   Time Total: 75 minutes Greater than 50%  of this time was spent counseling and coordinating care related to the above assessment and plan.  Signed by: Dana Hawks, DNP, FNP-BC Palliative Medicine    Please contact Palliative Medicine Team phone at [phone] for questions and concerns.  For individual provider: See [facility]

## 2021-09-05 NOTE — Progress Notes (Signed)
Progress Note   Patient: Dana Love EXH:[id number] DOB: October 12, 1932 DOA: 08/31/2021     5 DOS: the patient was seen and examined on 09/05/2021   Brief hospital course: [facility] is a 88 y.o. female with medical history significant of dementia, osteoporosis, hyperlipidemia, right hip fracture in 2021 presented to the ED on 08/31/2021 after having an unwitnessed fall and subsequent pain in her left hip and numbness and tingling in the left leg and lower back.  Due to baseline dementia, patient was unable to provide history or details of any symptoms prior to the fall. When seen in the ED on admission, patient denied having any pain at the time.  She did not know that she is in the hospital or why she is here.  Son was at bedside and confirms this to be her cognitive baseline.   ED course --BP elevated 171/77, heart rate 59 otherwise normal vital signs. CMP notable only for glucose of 109, calcium 8.5. CBC notable only for mildly low platelets 134. Imaging --unremarkable CT head and CT cervical spine.  CT lumbar spine showed likely subacute or chronic bilateral sacral insufficiency fractures and chronic degenerative changes. Left hip x-ray showed an acute intertrochanteric fracture.   Orthopedic surgery was consulted and took patient to the OR for repair by intramedullary nailing. Admitted to the hospital for further management including pain control and PT/OT evaluation postoperatively  Assessment and Plan: * Closed intertrochanteric fracture of left femur [facility]) Patient had an unwitnessed fall at her ALF with subsequent left hip pain.  Patient has osteoporosis and prior history of right hip fracture in 2021.-- Orthopedic surgery consulted -- Underwent ORIF day of admission 8/10 -- PT OT - recommend SNF -- Pain control -scheduled Tylenol, as needed tramadol or oxycodone, as needed Robaxin -- Bowel regimen -- DVT prophylaxis - TED hose, SCDs --Lovenox on hold due to anemia, resume  tomorrow if stable -- Weightbearing as tolerated on LLE -- Fall precautions  Hypokalemia Resolved with replacement for K 3.3. Monitor and replace as needed.  Leukocytosis WBC trended few days after surgery, but now improving 15.4 >> 11.2k today. Pt mental status waxes and wanes.   No fevers.  UA with pyuria. --Monitor CBC --Monitor for s/sx's of infection --Hold off antibiotics and monitor  Thrombocytopenia (HCC) Appears chronic.  Plt count 141 in December. This admission platelet trend 134 >> 126 >> 97 >> 109k.   --Hold Lovenox with severe anemia --Monitor CBC daily --Monitor for bleeding  ABLA (acute blood loss anemia) Hbg initially 12.7 on admission, trending down since surgery.   8/14 Hbg 6.8 >> 6.3; transfused 1 unit pRBC's 8/15 Hbg 7.6 >> 7.4 after transfusion. Left lateral thigh occlusive dressing clean, no surrounding swelling to suggest hematoma. --Iron infusion  --Repeat H&H this afternoon --Monitor CBC --No signs of active bleeding, monitor --Hold Lovenix, resume tomorrow if Hbg stable  Hyperlipidemia Continue Lipitor  Osteoporosis Continue on calcium and vitamin D supplements  Dementia without behavioral disturbance [facility]) Patient is a resident of [facility], ALF memory care.   --Continue Aricept, Zoloft --Delirium precautions         Subjective: Patient denies pain or feeling sick.  She has been much more alert today after getting blood yesterday. No acute events reported.     Physical Exam: Vitals:   09/05/21 0528 09/05/21 0908 09/05/21 1240 09/05/21 1607  BP: (!) 159/59 (!) 127/51 (!) 166/63 (!) 141/55  Pulse: 66 83 78 86  Resp: 14 14  15  Temp: 98.2 F (36.8 C) 98.3 F (36.8 C) 98.6 F (37 C) 98.2 F (36.8 C)  TempSrc: Oral Oral  Oral  SpO2: 99% 98% 100% 100%  Weight:      Height:       General exam: awake and alert, no acute distress HEENT: moist mucus membranes, hearing grossly normal  Respiratory system: lungs clear, on room  air, normal respiratory effort. Cardiovascular system: RRR. Warm distal extremities, no pedal edema.   Central nervous system: alert, oriented to self, normal speech, grossly non-focal Extremities: no edema, normal tone Skin: dry, intact, normal temperature   Data Reviewed:  Notable labs: Hbg 7.6 >> 7.4, glucose 121, Cq 7.6, WBC improved 11.2k  Family Communication: Son at bedside on rounds   Disposition: Status is: Inpatient Remains inpatient appropriate because: Monitor anemia for stability after transfusion yesterday. Requires SNF placement for rehab   Planned Discharge Destination: Skilled nursing facility    Time spent: 45 minutes including time spent at bedside and in coordination of care.   Author: Pennie Banter, DO 09/05/2021 6:44 PM  For on call review [email or web address].

## 2021-09-05 NOTE — Progress Notes (Signed)
Occupational Therapy Treatment Patient Details Name: Dana Love MRN: [id number] DOB: May 20, 1932 Today's Date: 09/05/2021   History of present illness Dana Love is an 88 y.o. female with a history of significant dementia who had a fall earlier today.  The patient noted immediate hip pain and inability to ambulate. S/p L femur IM nail on 08/31/21.   OT comments  Upon arrival, pt resting in recliner. Pt pleasant and agreeable to OT session. Pt disoriented to time, place, and situation. OT focusing on functional cognition with self-care tasks. Pt engaged in grooming tasks while sitting up in recliner. Required increased time and repetition to follow one step commands. OT providing verbal and tactile cues for initiation, sequencing, and termination of tasks. Pt perseverating on same questions throughout session, OT providing information for appropriate orientation to day/month/year/location. Pt deferring further OOB mobility despite encouragement from therapist and stating "I am comfortable where I am." Pt left sitting in recliner set up for self-feeding. D/C recommendations remain appropriate. OT will continue to follow acutely.   Recommendations for follow up therapy are one component of a multi-disciplinary discharge planning process, led by the attending physician.  Recommendations may be updated based on patient status, additional functional criteria and insurance authorization.    Follow Up Recommendations  Skilled nursing-short term rehab (<3 hours/day)    Assistance Recommended at Discharge Frequent or constant Supervision/Assistance  Patient can return home with the following  A lot of help with walking and/or transfers;A lot of help with bathing/dressing/bathroom;Help with stairs or ramp for entrance   Equipment Recommendations  Other (comment) (defer to next venue of care)    Recommendations for Other Services      Precautions / Restrictions Precautions Precautions:  Fall Restrictions Weight Bearing Restrictions: Yes LLE Weight Bearing: Weight bearing as tolerated       Mobility Bed Mobility               General bed mobility comments: pt received & left sitting in recliner    Transfers                   General transfer comment: not addressed this date, pt deferring OOB mobility     Balance Overall balance assessment: Needs assistance Sitting-balance support: Feet supported, Single extremity supported Sitting balance-Leahy Scale: Fair Sitting balance - Comments: pt able to sit upright in recliner chair without back support but required at least 1UE support on the arm rest of the chair       Standing balance comment: not addressed this date, pt deferring OOB mobility                           ADL either performed or assessed with clinical judgement   ADL Overall ADL's : Needs assistance/impaired Eating/Feeding: Set up;Supervision/ safety Eating/Feeding Details (indicate cue type and reason): for self feeding/drinking in sitting Grooming: Brushing hair;Wash/dry face;Set up;Cueing for sequencing;Minimal assistance Grooming Details (indicate cue type and reason): Pt completed grooming tasks while sitting in recliner. Pt able to wash face with set up A and VC for initiation, Min A for thoroughness with hair brushing to reach back of head                                    Extremity/Trunk Assessment Upper Extremity Assessment Upper Extremity Assessment: Difficult to assess due to impaired cognition  Lower Extremity Assessment Lower Extremity Assessment: Difficult to assess due to impaired cognition LLE Deficits / Details: pt with decreased strength and AROM deficits secondary to post-op pain and weakness; difficult to fully assess due to cognitive impairments        Vision       Perception     Praxis      Cognition Arousal/Alertness: Awake/alert Behavior During Therapy: WFL for tasks  assessed/performed Overall Cognitive Status: History of cognitive impairments - at baseline Area of Impairment: Orientation, Attention, Memory, Following commands, Safety/judgement, Awareness, Problem solving                 Orientation Level: Disoriented to, Situation, Time, Place Current Attention Level: Sustained Memory: Decreased short-term memory Following Commands: Follows one step commands with increased time Safety/Judgement: Decreased awareness of safety, Decreased awareness of deficits Awareness: Intellectual Problem Solving: Requires verbal cues, Requires tactile cues General Comments: Pt very alert and conversative, repeatedly asking therapist "what do you do?" When OT asked about flowers in room, 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 Pt telling therapist to "fix you some coffee and have a seat". When asked orientation questions pt unable to state that she is in the hospital although knew the correct city, stated today's date is April 30th (her birthday).        Exercises      Shoulder Instructions       General Comments      Pertinent Vitals/ Pain       Pain Assessment Pain Assessment: No/denies pain  Home Living                                          Prior Functioning/Environment              Frequency  Min 2X/week        Progress Toward Goals  OT Goals(current goals can now be found in the care plan section)  Progress towards OT goals: Progressing toward goals  Acute Rehab OT Goals Patient Stated Goal: to return to PLOF OT Goal Formulation: With patient/family Time For Goal Achievement: 09/15/21 Potential to Achieve Goals: Good  Plan Discharge plan remains appropriate;Frequency remains appropriate    Co-evaluation                 AM-PAC OT "6 Clicks" Daily Activity     Outcome Measure   Help from another person eating meals?: None Help from another  person taking care of personal grooming?: A Little Help from another person toileting, which includes using toliet, bedpan, or urinal?: A Lot Help from another person bathing (including washing, rinsing, drying)?: A Lot Help from another person to put on and taking off regular upper body clothing?: A Little Help from another person to put on and taking off regular lower body clothing?: A Lot 6 Click Score: 16    End of Session    OT Visit Diagnosis: Other abnormalities of gait and mobility (R26.89);Muscle weakness (generalized) (M62.81);History of falling (Z91.81)   Activity Tolerance Patient tolerated treatment well;Patient limited by fatigue   Patient Left in chair;with call bell/phone within reach;with chair alarm set   Nurse Communication Mobility status        Time: 1400-1413 OT Time Calculation (min): 13 min  Charges: OT General Charges $OT Visit: 1 Visit OT Treatments $  Self Care/Home Management : 8-22 mins  [facility] MS, OTR/L ascom [phone]  09/05/21, 3:22 PM

## 2021-09-05 NOTE — Progress Notes (Signed)
Physical Therapy Treatment Patient Details Name: Dana Love MRN: [id number] DOB: 02-Jun-1932 Today's Date: 09/05/2021   History of Present Illness Dana Love is an 88 y.o. female with a history of significant dementia who had a fall earlier today.  The patient noted immediate hip pain and inability to ambulate. S/p L femur IM nail on 08/31/21.    PT Comments    Pt with significant improvement in her arousal level, alertness, participation and cognition as compared to previous sessions. She was able to complete sit<>stand transfers with less physical assistance and tolerated taking a few steps forwards and backwards with RW and min guard from PT for safety. She remains very limited overall with her functional mobility secondary to weakness and pain. Pt would continue to benefit from skilled physical therapy services at this time while admitted and after d/c to address the below listed limitations in order to improve overall safety and independence with functional mobility.   Recommendations for follow up therapy are one component of a multi-disciplinary discharge planning process, led by the attending physician.  Recommendations may be updated based on patient status, additional functional criteria and insurance authorization.  Follow Up Recommendations  Skilled nursing-short term rehab (<3 hours/day) Can patient physically be transported by private vehicle: No   Assistance Recommended at Discharge Frequent or constant Supervision/Assistance  Patient can return home with the following Assist for transportation;Help with stairs or ramp for entrance;A lot of help with walking and/or transfers;A lot of help with bathing/dressing/bathroom   Equipment Recommendations  None recommended by PT    Recommendations for Other Services       Precautions / Restrictions Precautions Precautions: Fall Restrictions Weight Bearing Restrictions: Yes LLE Weight Bearing: Weight bearing as tolerated      Mobility  Bed Mobility               General bed mobility comments: pt received & left sitting in recliner    Transfers Overall transfer level: Needs assistance Equipment used: Rolling walker (2 wheels) Transfers: Sit to/from Stand Sit to Stand: Min guard           General transfer comment: pt performed sit<>stand transfers x3 with min guard for safety; pt demonstrating safe and appropriate hand placement    Ambulation/Gait Ambulation/Gait assistance: Min guard   Assistive device: Rolling walker (2 wheels)         General Gait Details: pt only able to tolerate taking 2-3 small steps forwards and backwards with each standing trial. she was unable to verbalize why other than "I just can't walk" and reporting pain in the anterior aspect of her L thigh   Stairs             Wheelchair Mobility    Modified Rankin (Stroke Patients Only)       Balance Overall balance assessment: Needs assistance Sitting-balance support: Feet supported, Single extremity supported, Bilateral upper extremity supported Sitting balance-Leahy Scale: Poor Sitting balance - Comments: pt able to sit upright in recliner chair without back support but required at least 1UE support on the arm rest of the chair   Standing balance support: Bilateral upper extremity supported Standing balance-Leahy Scale: Poor                              Cognition Arousal/Alertness: Awake/alert Behavior During Therapy: WFL for tasks assessed/performed Overall Cognitive Status: History of cognitive impairments - at baseline Area of Impairment: Orientation, Attention, Memory,  Following commands, Safety/judgement, Awareness, Problem solving                 Orientation Level: Disoriented to, Situation, Time, Place Current Attention Level: Sustained Memory: Decreased short-term memory Following Commands: Follows one step commands with increased time Safety/Judgement: Decreased  awareness of safety, Decreased awareness of deficits Awareness: Intellectual Problem Solving: Requires verbal cues, Requires tactile cues General Comments: Pt very alert this AM, engaging with PT throughout session; however, repeatedly asking about her fall and how it happened. Cannot remember where she lives [facility] although she has been there for 3 yrs per son's report. Son present in room and pt recognized him and appropriately engaged in conversation with him as well.        Exercises      General Comments        Pertinent Vitals/Pain Pain Assessment Pain Assessment: Faces Faces Pain Scale: Hurts little more Pain Location: L thigh (mostly), L elbow and R shoulder Pain Descriptors / Indicators: Grimacing, Guarding Pain Intervention(s): Monitored during session, Repositioned    Home Living                          Prior Function            PT Goals (current goals can now be found in the care plan section) Acute Rehab PT Goals PT Goal Formulation: With patient/family Time For Goal Achievement: 09/15/21 Potential to Achieve Goals: Fair Progress towards PT goals: Progressing toward goals    Frequency    7X/week      PT Plan Current plan remains appropriate    Co-evaluation              AM-PAC PT "6 Clicks" Mobility   Outcome Measure  Help needed turning from your back to your side while in a flat bed without using bedrails?: A Lot Help needed moving from lying on your back to sitting on the side of a flat bed without using bedrails?: A Lot Help needed moving to and from a bed to a chair (including a wheelchair)?: A Little Help needed standing up from a chair using your arms (e.g., wheelchair or bedside chair)?: A Little Help needed to walk in hospital room?: A Lot Help needed climbing 3-5 steps with a railing? : Total 6 Click Score: 13    End of Session Equipment Utilized During Treatment: Gait belt Activity Tolerance: Patient  tolerated treatment well Patient left: in chair;with call bell/phone within reach;with chair alarm set;with family/visitor present;Other (comment) (NP present in room) Nurse Communication: Mobility status PT Visit Diagnosis: Other abnormalities of gait and mobility (R26.89);Pain;Muscle weakness (generalized) (M62.81);Difficulty in walking, not elsewhere classified (R26.2) Pain - Right/Left: Left Pain - part of body: Hip     Time: 6073-7106 PT Time Calculation (min) (ACUTE ONLY): 21 min  Charges:  $Therapeutic Activity: 8-22 mins                     Arletta Bale, DPT  Acute Rehabilitation Services Office [phone]    Alessandra Bevels Allona Gondek 09/05/2021, 10:51 AM

## 2021-09-05 NOTE — Care Management Important Message (Signed)
Important Message  Patient Details  Name: Dana Love MRN: [id number] Date of Birth: [birth year]   Medicare Important Message Given:  Yes  Patient's son, Dana Love returned my call and said he was in agreement with the discharge when MD releases her. I thanked him for his time.   Olegario Messier A Delila Kuklinski 09/05/2021, 2:57 PM

## 2021-09-05 NOTE — Progress Notes (Signed)
  Subjective: 5 Days Post-Op Procedure(s) (LRB): INTRAMEDULLARY (IM) NAIL INTERTROCHANTERIC (Left) Patient reports pain as well-controlled.  Awake and alert this morning.  Mild soreness.  Slept well.  More alert today.  Received transfusion yesterday. Patient is well, and has had no acute complaints or problems Plan is to go Skilled nursing facility after hospital stay.  Objective: Vital signs in last 24 hours: Temp:  [97.6 F (36.4 C)-98.7 F (37.1 C)] 98.2 F (36.8 C) (08/15 0528) Pulse Rate:  [66-92] 66 (08/15 0528) Resp:  [14-18] 14 (08/15 0528) BP: (114-159)/(46-67) 159/59 (08/15 0528) SpO2:  [95 %-99 %] 99 % (08/15 0528)  Intake/Output from previous day:  Intake/Output Summary (Last 24 hours) at 09/05/2021 0703 Last data filed at 09/04/2021 1648 Gross per 24 hour  Intake 455 ml  Output --  Net 455 ml    Intake/Output this shift: No intake/output data recorded.  Labs: Recent Labs    09/03/21 0934 09/03/21 1608 09/04/21 0350 09/04/21 1722 09/05/21 0421  HGB 6.4* 6.8* 6.3* 7.6* 7.4*   Recent Labs    09/04/21 0350 09/04/21 1722 09/05/21 0421  WBC 15.4*  --  11.2*  RBC 2.06*  --  2.59*  HCT 18.9* 23.4* 22.8*  PLT 167  --  157   Recent Labs    09/04/21 0350 09/05/21 0421  NA 135 136  K 3.3* 3.9  CL 103 106  CO2 25 24  BUN 27* 23  CREATININE 0.79 0.59  GLUCOSE 138* 121*  CALCIUM 7.8* 7.6*   No results for input(s): "LABPT", "INR" in the last 72 hours.    EXAM General - Patient is  oriented to person only, pleasantly demented  Extremity - Neurovascular intact Dorsiflexion/Plantar flexion intact Compartment soft Dressing/Incision -clean, dry, scant proximal drainage Motor Function - intact, moving foot and toes well on exam.  Limited ambulation to the chair.   Assessment/Plan: 5 Days Post-Op Procedure(s) (LRB): INTRAMEDULLARY (IM) NAIL INTERTROCHANTERIC (Left) Principal Problem:   Closed intertrochanteric fracture of left femur (HCC) Active  Problems:   Dementia without behavioral disturbance (HCC)   Osteoporosis   Hyperlipidemia   ABLA (acute blood loss anemia)   Thrombocytopenia (HCC)   Leukocytosis   Hypokalemia  Estimated body mass index is 26.46 kg/m as calculated from the following:   Height as of this encounter: 4\' 11"  (1.499 m).   Weight as of this encounter: 59.4 kg. Advance diet Up with therapy  Hg 7.6 this AM after transfusion yesterday.   DVT Prophylaxis - Lovenox, Ted hose, and SCDs Weight-Bearing as tolerated to left leg  , PA-C [facility] 09/05/2021, 7:03 AM

## 2021-09-06 DIAGNOSIS — R4189 Other symptoms and signs involving cognitive functions and awareness: Secondary | ICD-10-CM

## 2021-09-06 DIAGNOSIS — S72145A Nondisplaced intertrochanteric fracture of left femur, initial encounter for closed fracture: Secondary | ICD-10-CM | POA: Diagnosis not present

## 2021-09-06 DIAGNOSIS — F0390 Unspecified dementia without behavioral disturbance: Secondary | ICD-10-CM | POA: Diagnosis not present

## 2021-09-06 DIAGNOSIS — D62 Acute posthemorrhagic anemia: Secondary | ICD-10-CM | POA: Diagnosis not present

## 2021-09-06 DIAGNOSIS — Z515 Encounter for palliative care: Secondary | ICD-10-CM | POA: Diagnosis not present

## 2021-09-06 LAB — CBC
HCT: 22.5 % — ABNORMAL LOW (ref 36.0–46.0)
Hemoglobin: 7.3 g/dL — ABNORMAL LOW (ref 12.0–15.0)
MCH: 28.5 pg (ref 26.0–34.0)
MCHC: 32.4 g/dL (ref 30.0–36.0)
MCV: 87.9 fL (ref 80.0–100.0)
Platelets: 210 10*3/uL (ref 150–400)
RBC: 2.56 MIL/uL — ABNORMAL LOW (ref 3.87–5.11)
RDW: 17.2 % — ABNORMAL HIGH (ref 11.5–15.5)
WBC: 11 10*3/uL — ABNORMAL HIGH (ref 4.0–10.5)
nRBC: 0.6 % — ABNORMAL HIGH (ref 0.0–0.2)

## 2021-09-06 MED ORDER — METOPROLOL TARTRATE 5 MG/5ML IV SOLN
5.0000 mg | Freq: Three times a day (TID) | INTRAVENOUS | Status: DC
  Administered 2021-09-06 – 2021-09-07 (×2): 5 mg via INTRAVENOUS
  Filled 2021-09-06 (×5): qty 5

## 2021-09-06 MED ORDER — LACTATED RINGERS IV SOLN: INTRAVENOUS | Status: DC

## 2021-09-06 NOTE — Progress Notes (Signed)
Physical Therapy Treatment Patient Details Name: Dana Love MRN: [id number] DOB: 1932-09-14 Today's Date: 09/06/2021   History of Present Illness Dana Love is an 88 y.o. female with a history of significant dementia who had a fall earlier today.  The patient noted immediate hip pain and inability to ambulate. S/p L femur IM nail on 08/31/21.    PT Comments    Pt much improved functionally today. Able to perform transfers from various surfaces with Min/ModA and cues for hand placement. Pt completed gait training with support of RW, CGA, 63ft x 1, 7ft x 1 with seated rest break. Pt c/o L hip discomfort, however if reminded of "cause" pt can be encouraged to advance gait distance and participation. Pt will benefit from continued PT once medically cleared for d/c. Cont. Per POC.   Recommendations for follow up therapy are one component of a multi-disciplinary discharge planning process, led by the attending physician.  Recommendations may be updated based on patient status, additional functional criteria and insurance authorization.  Follow Up Recommendations  Skilled nursing-short term rehab (<3 hours/day) Can patient physically be transported by private vehicle: No   Assistance Recommended at Discharge Frequent or constant Supervision/Assistance  Patient can return home with the following Assist for transportation;Help with stairs or ramp for entrance;A lot of help with walking and/or transfers;A lot of help with bathing/dressing/bathroom   Equipment Recommendations  None recommended by PT    Recommendations for Other Services       Precautions / Restrictions Precautions Precautions: Fall Restrictions Weight Bearing Restrictions: Yes LLE Weight Bearing: Weight bearing as tolerated     Mobility  Bed Mobility Overal bed mobility: Needs Assistance Bed Mobility: Supine to Sit, Sit to Supine     Supine to sit: Total assist, +2 for physical assistance, HOB elevated Sit to  supine: Total assist, +2 for physical assistance, HOB elevated   General bed mobility comments: pt received & left sitting in recliner    Transfers Overall transfer level: Needs assistance Equipment used: Rolling walker (2 wheels) Transfers: Sit to/from Stand Sit to Stand: Min guard Stand pivot transfers: Mod assist, Min assist         General transfer comment: Much improved since previous day    Ambulation/Gait Ambulation/Gait assistance: Min guard Gait Distance (Feet): 16 Feet Assistive device: Rolling walker (2 wheels) Gait Pattern/deviations: Step-to pattern, Step-through pattern, Decreased stride length, Trunk flexed Gait velocity: decreased     General Gait Details: 46ft x 1, 51ft x 1   Stairs             Wheelchair Mobility    Modified Rankin (Stroke Patients Only)       Balance Overall balance assessment: Needs assistance Sitting-balance support: Feet supported, Single extremity supported Sitting balance-Leahy Scale: Fair     Standing balance support: Bilateral upper extremity supported, Reliant on assistive device for balance Standing balance-Leahy Scale: Poor                              Cognition Arousal/Alertness: Awake/alert Behavior During Therapy: WFL for tasks assessed/performed Overall Cognitive Status: History of cognitive impairments - at baseline Area of Impairment: Orientation, Attention, Memory, Following commands, Safety/judgement, Awareness, Problem solving                 Orientation Level: Disoriented to, Situation, Time, Place Current Attention Level: Sustained Memory: Decreased short-term memory Following Commands: Follows one step commands with increased time Safety/Judgement:  Decreased awareness of safety, Decreased awareness of deficits Awareness: Intellectual Problem Solving: Requires verbal cues, Requires tactile cues General Comments: Pt very alert and conversative, repeatedly asking therapist  "what do you do?" When OT asked about flowers in room, 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 Pt telling therapist to "fix you some coffee and have a seat". When asked orientation questions pt unable to state that she is in the hospital although knew the correct city, stated today's date is April 30th (her birthday).        Exercises General Exercises - Lower Extremity Ankle Circles/Pumps: AROM, Both, 10 reps Long Arc Quad: AROM, Both, 10 reps    General Comments General comments (skin integrity, edema, etc.): Repeated vc's to re-orient due to dementia      Pertinent Vitals/Pain Pain Assessment Pain Assessment: Faces Faces Pain Scale: Hurts little more Pain Location: L thigh (mostly), L elbow and R shoulder Pain Descriptors / Indicators: Grimacing, Guarding Pain Intervention(s): Monitored during session    Home Living                          Prior Function            PT Goals (current goals can now be found in the care plan section) Acute Rehab PT Goals Patient Stated Goal: decrease pain Progress towards PT goals: Progressing toward goals    Frequency    7X/week      PT Plan Current plan remains appropriate    Co-evaluation              AM-PAC PT "6 Clicks" Mobility   Outcome Measure  Help needed turning from your back to your side while in a flat bed without using bedrails?: A Lot Help needed moving from lying on your back to sitting on the side of a flat bed without using bedrails?: A Lot Help needed moving to and from a bed to a chair (including a wheelchair)?: A Little Help needed standing up from a chair using your arms (e.g., wheelchair or bedside chair)?: A Little Help needed to walk in hospital room?: A Little Help needed climbing 3-5 steps with a railing? : Total 6 Click Score: 14    End of Session Equipment Utilized During Treatment: Gait belt Activity Tolerance: Patient  tolerated treatment well Patient left: in chair;with call bell/phone within reach;with chair alarm set;with family/visitor present Nurse Communication: Mobility status PT Visit Diagnosis: Other abnormalities of gait and mobility (R26.89);Pain;Muscle weakness (generalized) (M62.81);Difficulty in walking, not elsewhere classified (R26.2) Pain - Right/Left: Left Pain - part of body: Hip     Time: 5277-8242 PT Time Calculation (min) (ACUTE ONLY): 39 min  Charges:  $Gait Training: 8-22 mins $Therapeutic Exercise: 8-22 mins $Therapeutic Activity: 8-22 mins                    Dana Love, PTA  Jannet Askew 09/06/2021, 11:08 AM

## 2021-09-06 NOTE — Progress Notes (Signed)
Palliative Care Progress Note, Assessment & Plan   Patient Name: Dana Love       Date: 09/06/2021 DOB: 08-27-1932  Age: 88 y.o. MRN#: [id number] Attending Physician: Fran Lowes, DO Primary Care Physician: Barbette Reichmann, MD Admit Date: 08/31/2021  Reason for Consultation/Follow-up: Establishing goals of care  Subjective: Patient is sitting in the chair in no apparent distress.  She acknowledges my presence and is able to make her wishes known.  She has no acute complaints.  Her son and PT are at bedside.  HPI: 88 y.o. female  with past medical history of dementia, osteoporosis, HLD, and right hip fracture (2021) admitted on 08/31/2021 with unwitnessed fall at her assisted living facility [facility]).  Patient underwent left ORIF with IM nail placement on 8/10.  Patient has experienced post-op anemia (as low as 6.3) and received blood transfusions with improvement to 7.4 yesterday and 7.3 today, 8/16.   Palliative medicine team was consulted to discuss goals of care.   Summary of counseling/coordination of care: After reviewing the patient's chart and assessing patient at bedside, spoke with patient's son [name] in regards to plan of care.  Discussed patient's functional, nutritional, and cognitive status.  Patient has had poor p.o. intake during this hospitalization.  However, [name] states she had gained weight and had a healthy appetite at her ALF.  Reviewed dementia as a progressive, chronic disease.  Also discussed anemia.  [name] says his mother has become more childlike and acknowledges he is seen a significant decline in patient's mental faculties over the last month or so.  He shares she is started to talk to family members who have already passed away and she has started to confuse him  for his father.  Discussed visioning, human mortality, and the transitions patients make towards end-of-life.  Therapeutic silence and active listening provided for [name] to share his thoughts and emotions regarding his mother's current health situation.  I attempted to elicit goals important to the patient.  [name] shares that he and his wife understand that patient could shift to comfort in the very near future.  He is wanting to see what she can do as far as rehab and getting stronger, but appreciates that he does not want to do anything "to her" but rather "for her".   Discussed options of hospice at ALF and hospice philosophy.  Discussed difference between palliative and hospice.  [name] asks appropriate questions regarding rehab, hospice, and treating the treatable.  TOC made aware that [name] has further questions regarding disposition.  [name] acknowledges he has seen a significant decline in patient's  Code Status: DNR  Prognosis: Unable to determine  Discharge Planning: To Be Determined  Physical Exam Vitals reviewed.  Constitutional:      Appearance: Normal appearance. She is normal weight.  HENT:     Mouth/Throat:     Mouth: Mucous membranes are moist.  Eyes:     Pupils: Pupils are equal, round, and reactive to light.  Cardiovascular:     Rate and Rhythm: Normal rate.     Pulses: Normal pulses.  Pulmonary:     Effort: Pulmonary effort is normal.  Abdominal:     Palpations: Abdomen is soft.  Musculoskeletal:     Comments: WBAT on LLE, limited mobility d/t pain  Skin:    General: Skin is warm and dry.  Neurological:     Mental Status: She is alert. Mental status is at baseline.             Palliative Assessment/Data: 50%    Total Time 35 minutes  Greater than 50%  of this time was spent counseling and coordinating care related to the above assessment and plan.  Thank you for allowing the Palliative Medicine Team to assist in the care of this  patient.  Samara Deist L. Manon Hilding, FNP-BC Palliative Medicine Team Team Phone # [phone]

## 2021-09-06 NOTE — Assessment & Plan Note (Addendum)
Resolved. All sedating/narcotinc medications were stopped on 09/06/2021. Today the patient is again sitting up in a chair at bedside. Her son stated that she ate donuts this morning, although otherwise she is eating and drinking very little.

## 2021-09-06 NOTE — Progress Notes (Signed)
Nutrition Follow-up  DOCUMENTATION CODES:   Not applicable  INTERVENTION:   -Continue Ensure Enlive po BID, each supplement provides 350 kcal and 20 grams of protein -Continue MVI with minerals daily   NUTRITION DIAGNOSIS:   Increased nutrient needs related to post-op healing as evidenced by estimated needs.  Ongoing  GOAL:   Patient will meet greater than or equal to 90% of their needs  Progressing   MONITOR:   PO intake, Supplement acceptance, Diet advancement  REASON FOR ASSESSMENT:   Consult Assessment of nutrition requirement/status, Hip fracture protocol  ASSESSMENT:   Pt with a history of significant dementia who had a fall earlier today.  The patient noted immediate hip pain and inability to ambulate.  The patient ambulates with a walker at baseline. Pain is worse with any sort of movement.  X-rays in the emergency department show a left intertrochanteric hip fracture.  CT scan of the lumbar spine also shows chronic compression fracture at L1 as well as likely subacute bilateral sacral insufficiency fractures.  Reviewed I/O's: -150 ml x 24 hours and +1.3 L since admission   UOP: 150 ml x 24 hours  Pt with fair oral intake. Noted meal completions 0-50%. Pt is consuming Ensure supplements.   Per TOC notes, plan for SNF placement. Per palliative care notes, pt son desires SNF placement with outpatient palliative care services.   Medications reviewed and include vitamin C and vitamin D3.   Labs reviewed: CBGS: 109 (inpatient orders for glycemic control are none).    Diet Order:   Diet Order             Diet regular Room service appropriate? No; Fluid consistency: Thin  Diet effective now                   EDUCATION NEEDS:   No education needs have been identified at this time  Skin:  Skin Assessment: Skin Integrity Issues: Skin Integrity Issues:: Incisions Incisions: closed lt hip  Last BM:  09/06/21 (type 4)  Height:   Ht Readings from Last  1 Encounters:  08/31/21 4\' 11"  (1.499 m)    Weight:   Wt Readings from Last 1 Encounters:  08/31/21 59.4 kg    Ideal Body Weight:  44.7 kg  BMI:  Body mass index is 26.46 kg/m.  Estimated Nutritional Needs:   Kcal:  1500-1700  Protein:  80-95 grams  Fluid:  > 1.5 L    10/31/21, RD, LDN, CDCES Registered Dietitian II Certified Diabetes Care and Education Specialist Please refer to [facility] for RD and/or RD on-call/weekend/after hours pager

## 2021-09-06 NOTE — Progress Notes (Signed)
PROGRESS NOTE  Dana Love GMW:[id number] DOB: March 10, 1932 DOA: 08/31/2021 PCP: Barbette Reichmann, MD  Brief History   Dana Love is a 88 y.o. female with medical history significant of dementia, osteoporosis, hyperlipidemia, right hip fracture in 2021 presented to the ED on 08/31/2021 after having an unwitnessed fall and subsequent pain in her left hip and numbness and tingling in the left leg and lower back.  Due to baseline dementia, patient was unable to provide history or details of any symptoms prior to the fall. When seen in the ED on admission, patient denied having any pain at the time.  She did not know that she is in the hospital or why she is here.  Son was at bedside and confirms this to be her cognitive baseline.   ED course --BP elevated 171/77, heart rate 59 otherwise normal vital signs. CMP notable only for glucose of 109, calcium 8.5. CBC notable only for mildly low platelets 134. Imaging --unremarkable CT head and CT cervical spine.  CT lumbar spine showed likely subacute or chronic bilateral sacral insufficiency fractures and chronic degenerative changes. Left hip x-ray showed an acute intertrochanteric fracture.   Orthopedic surgery was consulted and took patient to the OR for repair by intramedullary nailing. Admitted to the hospital for further management including pain control and PT/OT evaluation postoperatively  The patient's son states that she has not had anything to eat or drink for 2 days. She only sleeps.   The patient's son states that he does not want any escalation of care or heroic measures.  Consultants  Orthopedic surgery Palliative Care  Procedures  ORIF hip on 08/31/2021.  Antibiotics   Anti-infectives (From admission, onward)    Start     Dose/Rate Route Frequency Ordered Stop   08/31/21 1900  ceFAZolin (ANCEF) IVPB 2g/100 mL premix        2 g 200 mL/hr over 30 Minutes Intravenous Every 6 hours 08/31/21 1619 09/01/21 0943   08/31/21  1230  ceFAZolin (ANCEF) IVPB 2g/100 mL premix        2 g 200 mL/hr over 30 Minutes Intravenous  Once 08/31/21 1221 08/31/21 1339      Subjective  The patient is lying quietly in bed. She responds briefly to voice, and then goes back to sleep.  Objective   Vitals:  Vitals:   09/06/21 1300 09/06/21 1736  BP: (!) 155/80 (!) 171/68  Pulse: 80 79  Resp: 15 16  Temp: 99 F (37.2 C) 98.2 F (36.8 C)  SpO2: 96% 97%    Exam:  Constitutional:  The patient is sleeping soundly. She will awaken briefly to voice, and then go back to sleep. No acute distress. Respiratory:  No increased work of breathing. No wheezes, rales, or rhonchi No tactile fremitus Cardiovascular:  Regular rate and rhythm No murmurs, ectopy, or gallups. No lateral PMI. No thrills. Abdomen:  Abdomen is soft, non-tender, non-distended No hernias, masses, or organomegaly Normoactive bowel sounds.  Musculoskeletal:  No cyanosis, clubbing, or edema Skin:  No rashes, lesions, ulcers palpation of skin: no induration or nodules Neurologic:  Unable to evaluate as the patient is unable to cooperate with exam. Psychiatric:  Unable to evaluate as the patient is unable to cooperate with exam.  I have personally reviewed the following:   Today's Data  Vitals  Lab Data  CBC BMP  Micro Data    Imaging  CXR Hip x-ray CT C-spine CT head CT lumbar spine  Cardiology Data  EKG  Other Data    Scheduled Meds:  acetaminophen  1,000 mg Oral Q8H   ascorbic acid  500 mg Oral BID   atorvastatin  10 mg Oral Daily   cholecalciferol  2,000 Units Oral Daily   cyanocobalamin  1,000 mcg Oral Daily   docusate sodium  100 mg Oral BID   donepezil  10 mg Oral QHS   feeding supplement  237 mL Oral BID BM   metoprolol tartrate  5 mg Intravenous Q8H   multivitamin with minerals  1 tablet Oral Daily   raloxifene  60 mg Oral Daily   sertraline  100 mg Oral QHS   Continuous Infusions:  lactated ringers       Principal Problem:   Closed intertrochanteric fracture of left femur (HCC) Active Problems:   Dementia without behavioral disturbance (HCC)   Osteoporosis   Hyperlipidemia   ABLA (acute blood loss anemia)   Thrombocytopenia (HCC)   Leukocytosis   Hypokalemia   Decreased level of consciousness   LOS: 6 days   A & P  Assessment and Plan: * Closed intertrochanteric fracture of left femur (HCC) Patient had an unwitnessed fall at her ALF with subsequent left hip pain.  Patient has osteoporosis and prior history of right hip fracture in 2021.-- Orthopedic surgery consulted -- Underwent ORIF day of admission 8/10 -- PT OT - recommend SNF -- Pain control -scheduled Tylenol, as needed tramadol or oxycodone, as needed Robaxin -- Bowel regimen -- DVT prophylaxis - TED hose, SCDs --Lovenox on hold due to anemia, resume tomorrow if stable -- Weightbearing as tolerated on LLE -- Fall precautions  Decreased level of consciousness The patient's son who is at bedside states that the patient has not eaten for 2 days and only sleeps. All Dilaudid, Robaxin, and Oxycodone, and ultram have been discontinued. WBC is somewhat elevated at 11.2, but that is down from 15.4 on 09/04/2021. Will check procalcitonin. This is likely delirium related to patient's baseline dementia. Will start IV fluids as patient is not taking in PO fluids. Delirium precautions in place.  Hypokalemia Resolved with replacement for K 3.3. Monitor and replace as needed.  Leukocytosis WBC trended few days after surgery, but now improving 15.4 >> 11.2k today. Pt mental status waxes and wanes.   No fevers.  UA with pyuria. --Monitor CBC --Monitor for s/sx's of infection --Hold off antibiotics and monitor  Thrombocytopenia (HCC) Appears chronic.  Plt count 141 in December. This admission platelet trend 134 >> 126 >> 97 >> 109k.   --Hold Lovenox with severe anemia --Monitor CBC daily --Monitor for bleeding  ABLA (acute  blood loss anemia) Hbg initially 12.7 on admission, trending down since surgery.   8/14 Hbg 6.8 >> 6.3; transfused 1 unit pRBC's 8/15 Hbg 7.6 >> 7.4 after transfusion. 8/16 Hemoglobin 7.3 this morning. Left lateral thigh occlusive dressing clean, no surrounding swelling to suggest hematoma. --Iron infusion  --Repeat H&H this afternoon --Monitor CBC --No signs of active bleeding, monitor --Hold Lovenix, resume tomorrow if Hbg stable  Hyperlipidemia Continue Lipitor  Osteoporosis Continue on calcium and vitamin D supplements  Dementia without behavioral disturbance [facility]) Patient is a resident of [facility], ALF memory care.   --Continue Aricept, Zoloft --Delirium precautions       I have seen and examined this patient myself. I have spent 34 minutes in her evaluation and care.  DVT prophylaxis: SCD's Code Status: DNR Family Communication: Son at bedside Disposition Plan: tbd    Fran Lowes, DO [facility] Direct  contact: see [email or web address]  7PM-7AM contact night coverage as above 09/06/2021, 6:25 PM  LOS: 6 days

## 2021-09-07 DIAGNOSIS — S72145A Nondisplaced intertrochanteric fracture of left femur, initial encounter for closed fracture: Secondary | ICD-10-CM | POA: Diagnosis not present

## 2021-09-07 DIAGNOSIS — F0390 Unspecified dementia without behavioral disturbance: Secondary | ICD-10-CM | POA: Diagnosis not present

## 2021-09-07 DIAGNOSIS — D62 Acute posthemorrhagic anemia: Secondary | ICD-10-CM | POA: Diagnosis not present

## 2021-09-07 DIAGNOSIS — S72002A Fracture of unspecified part of neck of left femur, initial encounter for closed fracture: Secondary | ICD-10-CM | POA: Diagnosis not present

## 2021-09-07 NOTE — Plan of Care (Signed)

## 2021-09-07 NOTE — Progress Notes (Addendum)
Palliative Care Progress Note, Assessment & Plan   Patient Name: Dana Love       Date: 09/07/2021 DOB: [birth year]  Age: 88 y.o. MRN#: [id number] Attending Physician: Karie Kirks, DO Primary Care Physician: Tracie Harrier, MD Admit Date: 08/31/2021  Reason for Consultation/Follow-up: Establishing goals of care  Subjective: Patient is lying in bed and resting in no apparent distress.  No family is at bedside.  She is able to acknowledge my presence and make her wishes known but quickly goes back to sleep.  HPI: 88 y.o. female  with past medical history of dementia, osteoporosis, HLD, and right hip fracture (2021) admitted on 08/31/2021 with unwitnessed fall at her assisted living facility [facility]).  Patient underwent left ORIF with IM nail placement on 8/10.  Patient has experienced post-op anemia (as low as 6.3) and received blood transfusions with improvement to 7.4 yesterday and 7.3 today, 8/16.   Palliative medicine team was consulted to discuss goals of care. 88 y.o. female  with past medical history of dementia, osteoporosis, HLD, and right hip fracture (2021) admitted on 08/31/2021 with unwitnessed fall at her assisted living facility [facility]).  Patient underwent left ORIF with IM nail placement on 8/10.  Patient has experienced post-op anemia (as low as 6.3) and received blood transfusions with improvement to 7.3 as of 8/16. (No labs posted for 8/17).   Palliative medicine team was consulted to discuss goals of care.   Summary of counseling/coordination of care: After reviewing the patient's chart and assessing the patient at bedside, I attempted to speak with patient's son Dana Love over the phone.  No answer but HIPAA appropriate voicemail left.  I spoke with patient's dayshift nurse  Dana Love.  He confirms patient removed IV.  In line with patient and son's goals of no artificial nutrition/hydration, Dana Love was in agreement to not replace IV or restart IV fluids. IVF on hold at this time.   As far as nutrition, patient ate 80% of her meals - a drastic improvement since admission as patient has been eating almost 0% of any offered foods.   Dana Love shared with nursing that he thought patient was on comfort measures. Further discussion is needed to clarify if patient's son Dana Love wants to proceed with full comfort measures as not formal adjustments to focus solely on comfort have been made.    Addendum: Dana Love returned my call and I plan to meet with him bedside at 2pm today to further discuss [place].  I met with patient's son Dana Love at bedside.  Discussed goals of care and plan moving forward.  Dana Love would like to have patient continue with PT and OT.  He would like his mother to go to SNF with rehab and outpatient palliative to follow.  He asked what her hemoglobin is but no labs were drawn today.  I again attempted to elicit goals and values important to the patient.  Dana Love shares he does not want his mother to suffer.  He does not want to escalate care or do any aggressive measures.  He would like to monitor her blood work but [facility] he would not accept future transfusions as it may be futile. He wants to see how well  she can do with physical therapy.  He does not want to transition to comfort or hospice care at this time.  He recognizes that dementia is a progressive disease and that this event may have reduced his mother's functional mobility.  However, he is hoping that she will to gain back some physical strength.  Plan is for skilled nursing facility with rehab and outpatient palliative to follow.  Medical team made aware of Ken's wishes to continue with blood work and SNF with rehab.  Palliative medicine team will continue to follow with patient and support the family throughout her  hospitalization.  Code Status: DNR  Prognosis: Unable to determine  Discharge Planning: To Be Determined  Physical Exam Vitals reviewed.  Constitutional:      General: She is not in acute distress.    Appearance: Normal appearance. She is normal weight. She is not ill-appearing.  HENT:     Head: Normocephalic.     Mouth/Throat:     Mouth: Mucous membranes are moist.  Eyes:     Pupils: Pupils are equal, round, and reactive to light.  Cardiovascular:     Rate and Rhythm: Normal rate.     Pulses: Normal pulses.  Pulmonary:     Effort: Pulmonary effort is normal.  Abdominal:     Palpations: Abdomen is soft.  Musculoskeletal:     Comments: Generalized weakness  Skin:    General: Skin is warm and dry.  Neurological:     Mental Status: Mental status is at baseline.             Palliative Assessment/Data: 50%    Total Time 50 minutes  Greater than 50%  of this time was spent counseling and coordinating care related to the above assessment and plan.  Thank you for allowing the Palliative Medicine Team to assist in the care of this patient.  [place] Ilsa Iha, FNP-BC Palliative Medicine Team Team Phone # [phone]

## 2021-09-07 NOTE — Progress Notes (Signed)
PROGRESS NOTE  Dana Love DOB: 1932/09/03 DOA: 08/31/2021 PCP: Barbette Reichmann, MD  Brief History   Dana SCHEXNAYDER is a 88 y.o. female with medical history significant of dementia, osteoporosis, hyperlipidemia, right hip fracture in 2021 presented to the ED on 08/31/2021 after having an unwitnessed fall and subsequent pain in her left hip and numbness and tingling in the left leg and lower back.  Due to baseline dementia, patient was unable to provide history or details of any symptoms prior to the fall. When seen in the ED on admission, patient denied having any pain at the time.  She did not know that she is in the hospital or why she is here.  Son was at bedside and confirms this to be her cognitive baseline.   ED course --BP elevated 171/77, heart rate 59 otherwise normal vital signs. CMP notable only for glucose of 109, calcium 8.5. CBC notable only for mildly low platelets 134. Imaging --unremarkable CT head and CT cervical spine.  CT lumbar spine showed likely subacute or chronic bilateral sacral insufficiency fractures and chronic degenerative changes. Left hip x-ray showed an acute intertrochanteric fracture.   Orthopedic surgery was consulted and took patient to the OR for repair by intramedullary nailing. Admitted to the hospital for further management including pain control and PT/OT evaluation postoperatively  The patient is awake and alert today, if confused. She is also eating a little. It is an improvement.   The patient's son states that he does not want any escalation of care or heroic measures. He does want to follow the patient's hemoglobin as he feels that a drop in hemoglobin will signal that the patient is "transitioning" and is ready for hospice.  Consultants  Orthopedic surgery Palliative Care  Procedures  ORIF hip on 08/31/2021.  Antibiotics   Anti-infectives (From admission, onward)    Start     Dose/Rate Route Frequency Ordered Stop    08/31/21 1900  ceFAZolin (ANCEF) IVPB 2g/100 mL premix        2 g 200 mL/hr over 30 Minutes Intravenous Every 6 hours 08/31/21 1619 09/01/21 0943   08/31/21 1230  ceFAZolin (ANCEF) IVPB 2g/100 mL premix        2 g 200 mL/hr over 30 Minutes Intravenous  Once 08/31/21 1221 08/31/21 1339      Subjective  The patient is lying quietly in bed. She responds briefly to voice, and then goes back to sleep.  Objective   Vitals:  Vitals:   09/07/21 0413 09/07/21 1300  BP: (!) 162/67 (!) 142/60  Pulse: 71 74  Resp: 16 16  Temp: 98.5 F (36.9 C) 98.7 F (37.1 C)  SpO2: 100% 99%    Exam:  Constitutional:  The patient is sleeping soundly. She will awaken briefly to voice, and then go back to sleep. No acute distress. Respiratory:  No increased work of breathing. No wheezes, rales, or rhonchi No tactile fremitus Cardiovascular:  Regular rate and rhythm No murmurs, ectopy, or gallups. No lateral PMI. No thrills. Abdomen:  Abdomen is soft, non-tender, non-distended No hernias, masses, or organomegaly Normoactive bowel sounds.  Musculoskeletal:  No cyanosis, clubbing, or edema Skin:  No rashes, lesions, ulcers palpation of skin: no induration or nodules Neurologic:  Unable to evaluate as the patient is unable to cooperate with exam. Psychiatric:  Unable to evaluate as the patient is unable to cooperate with exam.  I have personally reviewed the following:   Today's Data  Vitals  Lab Data  CBC BMP  Micro Data    Imaging  CXR Hip x-ray CT C-spine CT head CT lumbar spine  Cardiology Data  EKG  Other Data    Scheduled Meds:  acetaminophen  1,000 mg Oral Q8H   ascorbic acid  500 mg Oral BID   atorvastatin  10 mg Oral Daily   cholecalciferol  2,000 Units Oral Daily   cyanocobalamin  1,000 mcg Oral Daily   docusate sodium  100 mg Oral BID   donepezil  10 mg Oral QHS   feeding supplement  237 mL Oral BID BM   metoprolol tartrate  5 mg Intravenous Q8H    multivitamin with minerals  1 tablet Oral Daily   raloxifene  60 mg Oral Daily   sertraline  100 mg Oral QHS   Continuous Infusions:  lactated ringers Stopped (09/07/21 0715)    Principal Problem:   Closed intertrochanteric fracture of left femur (HCC) Active Problems:   Dementia without behavioral disturbance (HCC)   Osteoporosis   Hyperlipidemia   ABLA (acute blood loss anemia)   Thrombocytopenia (HCC)   Leukocytosis   Hypokalemia   Decreased level of consciousness   LOS: 7 days   A & P  Assessment and Plan: * Closed intertrochanteric fracture of left femur (HCC) Patient had an unwitnessed fall at her ALF with subsequent left hip pain.  Patient has osteoporosis and prior history of right hip fracture in 2021.-- Orthopedic surgery consulted -- Underwent ORIF day of admission 8/10 -- PT OT - recommend SNF -- Pain control -scheduled Tylenol, as needed tramadol or oxycodone, as needed Robaxin -- Bowel regimen -- DVT prophylaxis - TED hose, SCDs --Lovenox on hold due to anemia, resume tomorrow if stable -- Weightbearing as tolerated on LLE -- Fall precautions  Decreased level of consciousness Resolved. All sedating/narcotinc medications were stopped on 09/06/2021. Today the patient is sitting up in a chair at bedside with her lunch in front of her. She is not eating. However, nursing states that today the patient has drank a Boost and ate a few bites of breakfast.   Hypokalemia Resolved with replacement for K 3.3. Monitor and replace as needed.  Leukocytosis WBC trended few days after surgery, but now improving 15.4 >> 11.0k today. Pt mental status waxes and wanes.   No fevers.  UA with pyuria. --Monitor CBC --Monitor for s/sx's of infection --Hold off antibiotics and monitor  Thrombocytopenia (HCC) Appears chronic.  Plt count 141 in December. This admission platelet trend 134 >> 126 >> 97 >> 210k.   --Hold Lovenox with severe anemia --Monitor CBC daily --Monitor  for bleeding  ABLA (acute blood loss anemia) Hbg initially 12.7 on admission, trending down since surgery.   8/14 Hbg 6.8 >> 6.3; transfused 1 unit pRBC's 8/15 Hbg 7.6 >> 7.4 after transfusion. 8/16 Hemoglobin 7.3 this morning. Left lateral thigh occlusive dressing clean, no surrounding swelling to suggest hematoma. --Iron infusion  --Repeat H&H this afternoon --Monitor CBC --No signs of active bleeding, monitor --Hold Lovenix, resume tomorrow if Hbg stable  Hyperlipidemia Continue Lipitor  Osteoporosis Continue on calcium and vitamin D supplements  Dementia without behavioral disturbance [facility]) Patient is a resident of [facility], ALF memory care.   --Continue Aricept, Zoloft --Delirium precautions    I have seen and examined this patient myself. I have spent 34 minutes in her evaluation and care.  DVT prophylaxis: SCD's Code Status: DNR Family Communication: Son at bedside Disposition Plan: tbd    Fran Lowes, DO [facility]  Direct contact: see [email or web address]  7PM-7AM contact night coverage as above 09/07/2021, 5:29 PM  LOS: 6 days

## 2021-09-07 NOTE — TOC Progression Note (Signed)
Transition of Care [facility]) - Progression Note    Patient Details  Name: Dana Love MRN: [id number] Date of Birth: [birth year]  [facility]) CM/SW Contact  Truddie Hidden, RN Phone Number: 09/07/2021, 3:58 PM  Clinical Narrative:    Vesta Mixer started.    Expected Discharge Plan: Skilled Nursing Facility Barriers to Discharge: SNF Pending bed offer, Insurance Authorization  Expected Discharge Plan and Services Expected Discharge Plan: Skilled Nursing Facility                                               Social Determinants of Health (SDOH) Interventions    Readmission Risk Interventions     No data to display

## 2021-09-08 DIAGNOSIS — Z7189 Other specified counseling: Secondary | ICD-10-CM

## 2021-09-08 DIAGNOSIS — Z515 Encounter for palliative care: Secondary | ICD-10-CM | POA: Diagnosis not present

## 2021-09-08 DIAGNOSIS — R4189 Other symptoms and signs involving cognitive functions and awareness: Secondary | ICD-10-CM | POA: Diagnosis not present

## 2021-09-08 DIAGNOSIS — S72145A Nondisplaced intertrochanteric fracture of left femur, initial encounter for closed fracture: Secondary | ICD-10-CM | POA: Diagnosis not present

## 2021-09-08 DIAGNOSIS — D62 Acute posthemorrhagic anemia: Secondary | ICD-10-CM | POA: Diagnosis not present

## 2021-09-08 DIAGNOSIS — F0390 Unspecified dementia without behavioral disturbance: Secondary | ICD-10-CM | POA: Diagnosis not present

## 2021-09-08 DIAGNOSIS — S72002A Fracture of unspecified part of neck of left femur, initial encounter for closed fracture: Secondary | ICD-10-CM | POA: Diagnosis not present

## 2021-09-08 LAB — CBC WITH DIFFERENTIAL/PLATELET
Abs Immature Granulocytes: 0.28 10*3/uL — ABNORMAL HIGH (ref 0.00–0.07)
Basophils Absolute: 0 10*3/uL (ref 0.0–0.1)
Basophils Relative: 0 %
Eosinophils Absolute: 0.2 10*3/uL (ref 0.0–0.5)
Eosinophils Relative: 2 %
HCT: 19.6 % — ABNORMAL LOW (ref 36.0–46.0)
Hemoglobin: 6.4 g/dL — ABNORMAL LOW (ref 12.0–15.0)
Immature Granulocytes: 2 %
Lymphocytes Relative: 13 %
Lymphs Abs: 1.5 10*3/uL (ref 0.7–4.0)
MCH: 29.5 pg (ref 26.0–34.0)
MCHC: 32.7 g/dL (ref 30.0–36.0)
MCV: 90.3 fL (ref 80.0–100.0)
Monocytes Absolute: 1 10*3/uL (ref 0.1–1.0)
Monocytes Relative: 9 %
Neutro Abs: 8.6 10*3/uL — ABNORMAL HIGH (ref 1.7–7.7)
Neutrophils Relative %: 74 %
Platelets: 280 10*3/uL (ref 150–400)
RBC: 2.17 MIL/uL — ABNORMAL LOW (ref 3.87–5.11)
RDW: 17.7 % — ABNORMAL HIGH (ref 11.5–15.5)
WBC: 11.6 10*3/uL — ABNORMAL HIGH (ref 4.0–10.5)
nRBC: 0.3 % — ABNORMAL HIGH (ref 0.0–0.2)

## 2021-09-08 LAB — BASIC METABOLIC PANEL
Anion gap: 4 — ABNORMAL LOW (ref 5–15)
BUN: 18 mg/dL (ref 8–23)
CO2: 25 mmol/L (ref 22–32)
Calcium: 7.6 mg/dL — ABNORMAL LOW (ref 8.9–10.3)
Chloride: 103 mmol/L (ref 98–111)
Creatinine, Ser: 0.58 mg/dL (ref 0.44–1.00)
GFR, Estimated: >60 mL/min (ref >60)
Glucose, Bld: 106 mg/dL — ABNORMAL HIGH (ref 70–99)
Potassium: 3.6 mmol/L (ref 3.5–5.1)
Sodium: 132 mmol/L — ABNORMAL LOW (ref 135–145)

## 2021-09-08 NOTE — Progress Notes (Signed)
Physical Therapy Treatment Patient Details Name: Dana Love MRN: [id number] DOB: 02-07-1932 Today's Date: 09/08/2021   History of Present Illness Dana Love is an 88 y.o. female with a history of significant dementia who had a fall earlier today.  The patient noted immediate hip pain and inability to ambulate. S/p L femur IM nail on 08/31/21.    PT Comments    MD cleared pt for participation in setting of low Hgb. Pt received on [facility] in care of NT. Pt given prolonged time on [facility] but unable to void but required cuing to get off BSC. Pt requires min/mod assist to transfer STS with cuing for anterior weight shift & safe hand placement to push to standing vs pulling upon AD. Pt declined walking but completed stand pivot BSC>recliner with RW & min assist. Upon standing, pt also states "I'm going to be sick" but doesn't elaborate & once in chair doesn't have any complaints. Pt performed LLE strengthening with AAROM PRN. Will continue to follow pt acutely to address LLE strengthening, balance, and gait with LRAD.     Recommendations for follow up therapy are one component of a multi-disciplinary discharge planning process, led by the attending physician.  Recommendations may be updated based on patient status, additional functional criteria and insurance authorization.  Follow Up Recommendations  Skilled nursing-short term rehab (<3 hours/day) Can patient physically be transported by private vehicle: No   Assistance Recommended at Discharge Frequent or constant Supervision/Assistance  Patient can return home with the following Assist for transportation;Help with stairs or ramp for entrance;A lot of help with walking and/or transfers;A lot of help with bathing/dressing/bathroom   Equipment Recommendations  None recommended by PT    Recommendations for Other Services       Precautions / Restrictions Precautions Precautions: Fall Restrictions Weight Bearing Restrictions: Yes LLE Weight  Bearing: Weight bearing as tolerated     Mobility  Bed Mobility                    Transfers   Equipment used: Rolling walker (2 wheels) Transfers: Sit to/from Stand Sit to Stand: Mod assist, Min assist   Step pivot transfers: Min assist            Ambulation/Gait                   Stairs             Wheelchair Mobility    Modified Rankin (Stroke Patients Only)       Balance Overall balance assessment: Needs assistance Sitting-balance support: Feet supported, Bilateral upper extremity supported Sitting balance-Leahy Scale: Fair     Standing balance support: Bilateral upper extremity supported, Reliant on assistive device for balance, During functional activity Standing balance-Leahy Scale: Poor                              Cognition Arousal/Alertness: Awake/alert Behavior During Therapy: Flat affect Overall Cognitive Status: History of cognitive impairments - at baseline Area of Impairment: Memory, Following commands, Safety/judgement, Awareness, Attention, Orientation, Problem solving                 Orientation Level: Disoriented to, Situation, Time, Place   Memory: Decreased short-term memory Following Commands: Follows one step commands with increased time Safety/Judgement: Decreased awareness of safety, Decreased awareness of deficits Awareness: Intellectual Problem Solving: Requires verbal cues, Requires tactile cues  Exercises General Exercises - Lower Extremity Long Arc Quad: AROM, 10 reps, Left, Strengthening, Supine Heel Slides: AAROM, Strengthening, Left, 10 reps Hip Flexion/Marching: AROM, Strengthening, Left, 10 reps, Seated    General Comments        Pertinent Vitals/Pain Pain Assessment Pain Assessment: Faces Faces Pain Scale: Hurts a little bit Pain Location: L hip Pain Descriptors / Indicators: Grimacing, Guarding Pain Intervention(s): Monitored during session    Home Living                           Prior Function            PT Goals (current goals can now be found in the care plan section) Acute Rehab PT Goals Patient Stated Goal: decrease pain PT Goal Formulation: With patient/family Time For Goal Achievement: 09/15/21 Potential to Achieve Goals: Fair Progress towards PT goals: PT to reassess next treatment    Frequency    7X/week      PT Plan Current plan remains appropriate    Co-evaluation              AM-PAC PT "6 Clicks" Mobility   Outcome Measure  Help needed turning from your back to your side while in a flat bed without using bedrails?: A Lot Help needed moving from lying on your back to sitting on the side of a flat bed without using bedrails?: A Lot Help needed moving to and from a bed to a chair (including a wheelchair)?: A Little Help needed standing up from a chair using your arms (e.g., wheelchair or bedside chair)?: A Little Help needed to walk in hospital room?: A Lot Help needed climbing 3-5 steps with a railing? : Total 6 Click Score: 13    End of Session   Activity Tolerance: Patient tolerated treatment well Patient left: in chair;with chair alarm set;with call bell/phone within reach Nurse Communication: Mobility status PT Visit Diagnosis: Other abnormalities of gait and mobility (R26.89);Pain;Muscle weakness (generalized) (M62.81);Difficulty in walking, not elsewhere classified (R26.2) Pain - Right/Left: Left Pain - part of body: Hip     Time: 9509-3267 PT Time Calculation (min) (ACUTE ONLY): 10 min  Charges:  $Therapeutic Activity: 8-22 mins                     Aleda Grana, PT, DPT 09/08/21, 12:15 PM   Sandi Mariscal 09/08/2021, 12:13 PM

## 2021-09-08 NOTE — Progress Notes (Signed)
[employer] engineer, contracting [facility]) Hospital Liaison Note  Received request from Palliative provider/Kathryn S. for family interest in Hospice Home.   Lengthy discussion held with family to explain services and offer possible options for hospice services alongside PMT. Family has chosen to proceed with Hospice Home evaluation.  Visited patient at bedside and spoke with Son/Ken, DIL & PMT to confirm interest and explain services.  Approval for Hospice Home is determined by [facility] MD. Once [facility] MD has determined Hospice Home eligibility, [facility] will update hospital staff and family.  Please do not hesitate to call with any hospice related questions.    Thank you for the opportunity to participate in this patient's care.  Odette Fraction, MSW [facility] Liaison  [phone]

## 2021-09-08 NOTE — Progress Notes (Addendum)
Palliative Care Progress Note, Assessment & Plan   Patient Name: Dana Love       Date: 09/08/2021 DOB: 05-30-1932  Age: 88 y.o. MRN#: [id number] Attending Physician: Karie Kirks, DO Primary Care Physician: Tracie Harrier, MD Admit Date: 08/31/2021  Reason for Consultation/Follow-up: Establishing goals of care  Subjective: Patient is lying in bed in no apparent distress.  No family currently at bedside.  HPI: 88 y.o. female  with past medical history of dementia, osteoporosis, HLD, and right hip fracture (2021) admitted on 08/31/2021 with unwitnessed fall at her assisted living facility [facility]).  Patient underwent left ORIF with IM nail placement on 8/10.  Patient has experienced post-op anemia (as low as 6.3) and received blood transfusions with improvement to 7.4 yesterday and 7.3 today, 8/16.   Palliative medicine team was consulted to discuss goals of care. 88 y.o. female  with past medical history of dementia, osteoporosis, HLD, and right hip fracture (2021) admitted on 08/31/2021 with unwitnessed fall at her assisted living facility [facility]).  Patient underwent left ORIF with IM nail placement on 8/10.  Patient has experienced post-op anemia (as low as 6.3) and received blood transfusions with improvement to 7.3 as of 8/16. But, Hgb dropped again to 6.4 today, 8/18.   Palliative medicine team was consulted to discuss goals of care.   Summary of counseling/coordination of care: After reviewing patient's chart and assessing patient at bedside, spoke to patient's son [name] over the phone to discuss goals of care.  [name] is aware the patient's hemoglobin has dropped to 6.4.  He does not wish to further pursue blood transfusions or further testing/investigation for root cause of anemia.  [name]  now wants to focus on keeping his mother comfortable and free from pain and suffering.  In light of patient's goals and Ken's wishes to focus on comfort, we discussed comfort care in the hospital and at discharge.  Reviewed that comfort measures include no more blood draws/labs/procedures, vital signs at family's request and once a day, use of as needed medications to relieve symptom burden, and keeping patient clean, warm, dry, and as comfortable as possible.  Disposition options for hospice benefits discussed briefly. [name] shares he would like to have the option to take his mother to the hospice house on [address]. as he is familiar from previous family members who have been there.  [name] would like to have patient return for [facility] with hospice services to follow there.  Attending and TOC made aware of patient's wishes.  As per TOC, awaiting response from [facility] to determine patient's options to return there.  I plan on meeting with patient, [name], and his wife bedside at 2pm to further discuss comfort measures and GOC.   Addendum:  I met with patient's son [name], his wife [name], and hospice liaison Lorayne Bender to discuss next steps.  Hospice philosophy and hospice options reviewed.  [place] agreement to continue with comfort measures and have patient evaluated for hospice placement.  Goals are clear.  DNR remains.  TOC to manage disposition.  PMT will remain available to patient and family for any future palliative needs.  PMT will shadow the patient's chart and reengage if  goals change, at patient/family's request, or if patient's health declines.  Please utilize AMION for on-call providers as there is no in -person PMT coverage at [facility] over the weekend.  Code Status: DNR  Prognosis: < 6 months  Discharge Planning: To Be Determined - hospice home or ALF with Hospice  Physical Exam Vitals reviewed.  Constitutional:      General: She is not in acute distress.    Appearance: Normal  appearance. She is normal weight. She is not ill-appearing.  Cardiovascular:     Rate and Rhythm: Normal rate.  Pulmonary:     Effort: Pulmonary effort is normal.  Abdominal:     Palpations: Abdomen is soft.  Musculoskeletal:     Comments: Generalized weakness  Skin:    General: Skin is warm and dry.     Coloration: Skin is pale.             Palliative Assessment/Data: 40%    Total Time 120 minutes  Greater than 50%  of this time was spent counseling and coordinating care related to the above assessment and plan.  Thank you for allowing the Palliative Medicine Team to assist in the care of this patient.   L. , DNP, FNP-BC Palliative Medicine Team Team Phone # [phone]   

## 2021-09-08 NOTE — TOC Progression Note (Signed)
Transition of Care [facility]) - Progression Note    Patient Details  Name: Dana Love MRN: [id number] Date of Birth: Apr 18, 1932  [facility]) CM/SW Contact  Truddie Hidden, RN Phone Number: 09/08/2021, 1:46 PM  Clinical Narrative:    Juliann Pares to determine if patient could return to facility with hospice care. Message left for Administrator Ed Weeks.    Expected Discharge Plan: Skilled Nursing Facility Barriers to Discharge: SNF Pending bed offer, Insurance Authorization  Expected Discharge Plan and Services Expected Discharge Plan: Skilled Nursing Facility                                               Social Determinants of Health (SDOH) Interventions    Readmission Risk Interventions     No data to display

## 2021-09-08 NOTE — Progress Notes (Signed)
PROGRESS NOTE  ALEA RODES [id number] DOB: 1932-12-04 DOA: 08/31/2021 PCP: Tracie Harrier, MD  Brief History   Dana Love is a 88 y.o. female with medical history significant of dementia, osteoporosis, hyperlipidemia, right hip fracture in 2021 presented to the ED on 08/31/2021 after having an unwitnessed fall and subsequent pain in her left hip and numbness and tingling in the left leg and lower back.  Due to baseline dementia, patient was unable to provide history or details of any symptoms prior to the fall. When seen in the ED on admission, patient denied having any pain at the time.  She did not know that she is in the hospital or why she is here.  Son was at bedside and confirms this to be her cognitive baseline.   ED course --BP elevated 171/77, heart rate 59 otherwise normal vital signs. CMP notable only for glucose of 109, calcium 8.5. CBC notable only for mildly low platelets 134. Imaging --unremarkable CT head and CT cervical spine.  CT lumbar spine showed likely subacute or chronic bilateral sacral insufficiency fractures and chronic degenerative changes. Left hip x-ray showed an acute intertrochanteric fracture.   Orthopedic surgery was consulted and took patient to the OR for repair by intramedullary nailing. Admitted to the hospital for further management including pain control and PT/OT evaluation postoperatively  The patient is awake and alert today, if confused. She is also eating a little. It is an improvement.   The patient's son states that he does not want any escalation of care or heroic measures. He does want to follow the patient's hemoglobin as he feels that a drop in hemoglobin will signal that the patient is "transitioning" and is ready for hospice.  This morning hemoglobin dropped to 6.4. The patient's son confirmed that he did not want his mother to receive a transfusion. He also stated that he is ready for comfort care and hospice. Palliative care  was contacted.  The patient has been accepted to residential hospice facility. Awaiting bed availability.  Consultants  Orthopedic surgery Palliative Care  Procedures  ORIF hip on 08/31/2021.  Antibiotics   Anti-infectives (From admission, onward)    Start     Dose/Rate Route Frequency Ordered Stop   08/31/21 1900  ceFAZolin (ANCEF) IVPB 2g/100 mL premix        2 g 200 mL/hr over 30 Minutes Intravenous Every 6 hours 08/31/21 1619 09/01/21 0943   08/31/21 1230  ceFAZolin (ANCEF) IVPB 2g/100 mL premix        2 g 200 mL/hr over 30 Minutes Intravenous  Once 08/31/21 1221 08/31/21 1339      Subjective  The patient is lying quietly in bed. She responds briefly to voice, and then goes back to sleep.  Objective   Vitals:  Vitals:   09/08/21 1124 09/08/21 1557  BP: (!) 112/49 (!) 140/57  Pulse: 97 90  Resp:    Temp: 99.6 F (37.6 C) 97.8 F (36.6 C)  SpO2: 100% 99%    Exam:  Constitutional:  The patient is sitting up at bedside.  She is confused. No new complaints. Respiratory:  No increased work of breathing. No wheezes, rales, or rhonchi No tactile fremitus Cardiovascular:  Regular rate and rhythm No murmurs, ectopy, or gallups. No lateral PMI. No thrills. Abdomen:  Abdomen is soft, non-tender, non-distended No hernias, masses, or organomegaly Normoactive bowel sounds.  Musculoskeletal:  No cyanosis, clubbing, or edema Skin:  No rashes, lesions, ulcers palpation of skin: no induration or  nodules Neurologic:  Patient is moving all extremities. Psychiatric:  Unable to evaluate as the patient is unable to cooperate with exam.  I have personally reviewed the following:   Today's Data  Vitals  Lab Data  CBC BMP  Micro Data    Imaging  CXR Hip x-ray CT C-spine CT head CT lumbar spine  Cardiology Data  EKG  Other Data    Scheduled Meds:  acetaminophen  1,000 mg Oral Q8H   ascorbic acid  500 mg Oral BID   atorvastatin  10 mg Oral Daily    cholecalciferol  2,000 Units Oral Daily   cyanocobalamin  1,000 mcg Oral Daily   docusate sodium  100 mg Oral BID   donepezil  10 mg Oral QHS   feeding supplement  237 mL Oral BID BM   metoprolol tartrate  5 mg Intravenous Q8H   multivitamin with minerals  1 tablet Oral Daily   raloxifene  60 mg Oral Daily   sertraline  100 mg Oral QHS   Continuous Infusions:  lactated ringers Stopped (09/07/21 0715)    Principal Problem:   Closed intertrochanteric fracture of left femur (HCC) Active Problems:   Dementia without behavioral disturbance (HCC)   Osteoporosis   Hyperlipidemia   ABLA (acute blood loss anemia)   Thrombocytopenia (HCC)   Leukocytosis   Hypokalemia   Decreased level of consciousness   LOS: 8 days   A & P  Assessment and Plan: * Closed intertrochanteric fracture of left femur (HCC) Patient had an unwitnessed fall at her ALF with subsequent left hip pain.  Patient has osteoporosis and prior history of right hip fracture in 2021.-- Orthopedic surgery consulted -- Underwent ORIF day of admission 8/10 -- PT OT - recommend SNF -- Pain control -scheduled Tylenol. All narcotic and sedating medications have been discontinued due to decreased level of consciousness. -- Bowel regimen -- DVT prophylaxis - TED hose, SCDs --Lovenox on hold due to anemia, resume tomorrow if stable -- Weightbearing as tolerated on LLE -- Fall precautions  Decreased level of consciousness Resolved. All sedating/narcotinc medications were stopped on 09/06/2021. Today the patient is again sitting up in a chair at bedside. Her son stated that she ate donuts this morning, although otherwise she is eating and drinking very little.   Hypokalemia Resolved with replacement for K 3.3. Monitor and replace as needed.  Leukocytosis WBC trended few days after surgery, but now improving 15.4 >> 11.0k today. Pt mental status waxes and wanes.   No fevers.  UA with pyuria. --Monitor CBC --Monitor for  s/sx's of infection --Hold off antibiotics and monitor  Thrombocytopenia (HCC) Resolving. Appears chronic.  Plt count 141 in December. This admission platelet trend 134 >> 126 >> 97 >> 210k.   --Hold Lovenox with severe anemia --Monitor CBC daily --Monitor for bleeding  ABLA (acute blood loss anemia) Hbg initially 12.7 on admission, trending down since surgery.   8/14 Hbg 6.8 >> 6.3; transfused 1 unit pRBC's 8/15 Hbg 7.6 >> 7.4 after transfusion. 8/16 Hemoglobin 7.3 this morning. Left lateral thigh occlusive dressing clean, no surrounding swelling to suggest hematoma. --Iron infusion  --Hgb this morning was 6.4. No transfusion per patient's son. --No signs of active bleeding, monitor   Hyperlipidemia Continue Lipitor  Osteoporosis Continue on calcium and vitamin D supplements  [facility] a [facility] of [facility].   --Continue Aricept, Zoloft --Delirium precautions    I have seen and examined this patient  myself. I have spent 34 minutes in her evaluation and care.  DVT prophylaxis: SCD's Code Status: DNR Family Communication: Son at bedside Disposition Plan: tbd    Fran Lowes, DO [facility] Direct contact: see [email or web address]  7PM-7AM contact night coverage as above 09/08/2021, 4:22 PM  LOS: 6 days

## 2021-09-08 NOTE — Care Management Important Message (Signed)
Important Message  Patient Details  Name: Dana Love MRN: [id number] Date of Birth: October 02, 1932   Medicare Important Message Given:  Other (see comment)  Patient and family met with Palliative Care today and waiting on [facility] MD determination if patient is eligible for the Hospice Home. Out of respect for the patient and family another Important Message from Medicare not given.    Juliann Pulse A Farzad Tibbetts 09/08/2021, 3:29 PM

## 2021-09-09 DIAGNOSIS — D62 Acute posthemorrhagic anemia: Secondary | ICD-10-CM | POA: Diagnosis not present

## 2021-09-09 DIAGNOSIS — S72145A Nondisplaced intertrochanteric fracture of left femur, initial encounter for closed fracture: Secondary | ICD-10-CM | POA: Diagnosis not present

## 2021-09-09 DIAGNOSIS — E785 Hyperlipidemia, unspecified: Secondary | ICD-10-CM

## 2021-09-09 DIAGNOSIS — F0390 Unspecified dementia without behavioral disturbance: Secondary | ICD-10-CM | POA: Diagnosis not present

## 2021-09-09 DIAGNOSIS — R4189 Other symptoms and signs involving cognitive functions and awareness: Secondary | ICD-10-CM

## 2021-09-09 MED ORDER — DOCUSATE SODIUM 100 MG PO CAPS: 100.0000 mg | ORAL_CAPSULE | Freq: Two times a day (BID) | ORAL | 0 refills | Status: AC

## 2021-09-09 MED ORDER — ACETAMINOPHEN 500 MG PO TABS: 1000.0000 mg | ORAL_TABLET | Freq: Three times a day (TID) | ORAL | 0 refills | Status: AC

## 2021-09-09 MED ORDER — BISACODYL 10 MG RE SUPP: 10.0000 mg | Freq: Every day | RECTAL | 0 refills | Status: AC | PRN

## 2021-09-09 MED ORDER — ADULT MULTIVITAMIN W/MINERALS CH: 1.0000 | ORAL_TABLET | Freq: Every day | ORAL | 0 refills | Status: AC

## 2021-09-09 MED ORDER — ENSURE ENLIVE PO LIQD: 237.0000 mL | Freq: Two times a day (BID) | ORAL | 12 refills | Status: AC

## 2021-09-09 NOTE — Plan of Care (Signed)

## 2021-09-09 NOTE — Plan of Care (Signed)
Patient discharged per MD orders at this time.All discharged instructions,education and medications reviewed with the patient and family.Pt and family expressed understanding and will comply with dc instructions.Pt was discharged to the [facility] palliative [facility],[place] per order.report was called to staff nurse Lennice Sites before transport.Pt was transported by 2 [facility] personnel on a stretcher.

## 2021-09-09 NOTE — Progress Notes (Signed)
  [facility] [address]  [facility] Hospital Liaison Note    Patient has been approved for hospice home and family has accepted bed offer. Consents completed. Spoke with Rocky Link and updated him. Report has already been called.    MD and TOC aware.     Please send signed and completed DNR with patient at discharge.   Please call with any questions or concerns.   Thank you,   Renae Fickle MSN RN [facility] Liaison [phone]

## 2021-09-09 NOTE — Discharge Summary (Signed)
Physician Discharge Summary   Patient: Dana Love MRN: [id number] DOB: 14-Aug-1932  Admit date:     08/31/2021  Discharge date: 09/09/21  Discharge Physician: Fran Lowes   PCP: Barbette Reichmann, MD   Recommendations at discharge:    Discharge to hospice facility  Discharge Diagnoses: Principal Problem:   Closed intertrochanteric fracture of left femur [facility]) Active Problems:   Dementia without behavioral disturbance (HCC)   Osteoporosis   Hyperlipidemia   ABLA (acute blood loss anemia)   Thrombocytopenia (HCC)   Leukocytosis   Hypokalemia   Decreased level of consciousness  Resolved Problems:   * No resolved hospital problems. [facility]: Dana Love is a 88 y.o. female with medical history significant of dementia, osteoporosis, hyperlipidemia, right hip fracture in 2021 presented to the ED on 08/31/2021 after having an unwitnessed fall and subsequent pain in her left hip and numbness and tingling in the left leg and lower back.  Due to baseline dementia, patient was unable to provide history or details of any symptoms prior to the fall. When seen in the ED on admission, patient denied having any pain at the time.  She did not know that she is in the hospital or why she is here.  Son was at bedside and confirms this to be her cognitive baseline.   ED course --BP elevated 171/77, heart rate 59 otherwise normal vital signs. CMP notable only for glucose of 109, calcium 8.5. CBC notable only for mildly low platelets 134. Imaging --unremarkable CT head and CT cervical spine.  CT lumbar spine showed likely subacute or chronic bilateral sacral insufficiency fractures and chronic degenerative changes. Left hip x-ray showed an acute intertrochanteric fracture.   Orthopedic surgery was consulted and took patient to the OR for repair by intramedullary nailing. Admitted to the hospital for further management including pain control and PT/OT evaluation postoperatively   The  patient is awake and alert today, if confused. She is also eating a little. It is an improvement.    The patient's son states that he does not want any escalation of care or heroic measures. He does want to follow the patient's hemoglobin as he feels that a drop in hemoglobin will signal that the patient is "transitioning" and is ready for hospice.   This morning hemoglobin dropped to 6.4. The patient's son confirmed that he did not want his mother to receive a transfusion. He also stated that he is ready for comfort care and hospice. Palliative care was contacted.   The patient has been accepted to residential hospice facility.   The patient will be discharged to residential hospice today in fair condition.  Assessment and Plan: * Closed intertrochanteric fracture of left femur [facility]) Patient had an unwitnessed fall at her ALF with subsequent left hip pain.  Patient has osteoporosis and prior history of right hip fracture in 2021.-- Orthopedic surgery consulted -- Underwent ORIF day of admission 8/10 -- PT OT - recommend SNF -- Pain control -scheduled Tylenol. All narcotic and sedating medications have been discontinued due to decreased level of consciousness. -- Bowel regimen -- DVT prophylaxis - TED hose, SCDs --Lovenox on hold due to anemia, resume tomorrow if stable -- Weightbearing as tolerated on LLE -- Fall precautions  Decreased level of consciousness Resolved. All sedating/narcotinc medications were stopped on 09/06/2021. Today the patient is again sitting up in a chair at bedside. Her son stated that she ate donuts this morning, although otherwise she is eating and drinking very  little.   Hypokalemia Resolved with replacement for K 3.3. Monitor and replace as needed.  Leukocytosis WBC trended few days after surgery, but now improving 15.4 >> 11.0k today. Pt mental status waxes and wanes.   No fevers.  UA with pyuria. --Monitor CBC --Monitor for s/sx's of infection --Hold  off antibiotics and monitor  Thrombocytopenia (HCC) Resolving. Appears chronic.  Plt count 141 in December. This admission platelet trend 134 >> 126 >> 97 >> 210k.   --Hold Lovenox with severe anemia --Monitor CBC daily --Monitor for bleeding  ABLA (acute blood loss anemia) Hbg initially 12.7 on admission, trending down since surgery.   8/14 Hbg 6.8 >> 6.3; transfused 1 unit pRBC's 8/15 Hbg 7.6 >> 7.4 after transfusion. 8/16 Hemoglobin 7.3 this morning. Left lateral thigh occlusive dressing clean, no surrounding swelling to suggest hematoma. --Iron infusion  --Hgb this morning was 6.4. No transfusion per patient's son. --No signs of active bleeding, monitor   Hyperlipidemia Continue Lipitor  Osteoporosis Continue on calcium and vitamin D supplements  Dementia without behavioral disturbance [facility]) Patient is a resident of [facility] memory care.   --Continue Aricept, Zoloft --Delirium precautions        Consultants: Orthopedic surgery Procedures performed: Intramedullary nailing of   Disposition: Hospice care Diet recommendation:  Discharge Diet Orders (From admission, onward)     Start     Ordered   09/09/21 0000  Diet - low sodium heart healthy        09/09/21 1041           Regular diet DISCHARGE MEDICATION: Allergies as of 09/09/2021   No Known Allergies      Medication List     STOP taking these medications    ascorbic acid 500 MG tablet Commonly known as: VITAMIN C   atorvastatin 10 MG tablet Commonly known as: LIPITOR   cyanocobalamin 1000 MCG tablet   enoxaparin 40 MG/0.4ML injection Commonly known as: LOVENOX   HYDROcodone-acetaminophen 5-325 MG tablet Commonly known as: NORCO/VICODIN   Pro-Stat 101 Liqd   Vitamin D 50 MCG (2000 UT) tablet       TAKE these medications    acetaminophen 500 MG tablet Commonly known as: TYLENOL Take 2 tablets (1,000 mg total) by mouth every 8 (eight) hours.   bisacodyl 10 MG  suppository Commonly known as: DULCOLAX Place 1 suppository (10 mg total) rectally daily as needed for moderate constipation.   docusate sodium 100 MG capsule Commonly known as: COLACE Take 1 capsule (100 mg total) by mouth 2 (two) times daily.   donepezil 10 MG tablet Commonly known as: ARICEPT Take 10 mg by mouth at bedtime.   feeding supplement Liqd Take 237 mLs by mouth 2 (two) times daily between meals.   memantine 10 MG tablet Commonly known as: NAMENDA Take 10 mg by mouth 2 (two) times daily.   multivitamin with minerals Tabs tablet Take 1 tablet by mouth daily. Start taking on: September 10, 2021   ondansetron 4 MG tablet Commonly known as: ZOFRAN Take 1 tablet (4 mg total) by mouth every 6 (six) hours as needed for nausea.   polyethylene glycol 17 g packet Commonly known as: MIRALAX / GLYCOLAX Take 17 g by mouth daily as needed for mild constipation.   raloxifene 60 MG tablet Commonly known as: EVISTA Take 60 mg by mouth daily.   sertraline 100 MG tablet Commonly known as: ZOLOFT Take 100 mg by mouth at bedtime.  Discharge Care Instructions  (From admission, onward)           Start     Ordered   09/09/21 0000  Discharge wound care:       Comments: Left Hip: Reinforce dressing until discontinued.   09/09/21 1041            Contact information for follow-up providers     Dedra Skeens, PA-C Follow up in 2 week(s).   Specialty: Orthopedic Surgery Why: For staple removal and x-rays of the left hip Contact information: [address] [place] [address] [phone]              Contact information for after-discharge care     Destination     [facility]erred [facility]F .   Service: Skilled Nursing Contact information: [address] [place] [address] [phone]                    Discharge Exam: Ceasar Mons Weights   08/31/21 1209  Weight: 59.4 kg    Exam:  Constitutional:  The patient is awake, alert, and confused. No acute distress. Respiratory:  No increased work of breathing. No wheezes, rales, or rhonchi No tactile fremitus Cardiovascular:  Regular rate and rhythm No murmurs, ectopy, or gallups. No lateral PMI. No thrills. Abdomen:  Abdomen is soft, non-tender, non-distended No hernias, masses, or organomegaly Normoactive bowel sounds.  Musculoskeletal:  No cyanosis, clubbing, or edema Skin:  No rashes, lesions, ulcers palpation of skin: no induration or nodules Neurologic:  Moving all extremities Psychiatric:  Calm/confused   Condition at discharge: poor  The results of significant diagnostics from this hospitalization (including imaging, microbiology, ancillary and laboratory) are listed below for reference.   Imaging Studies: DG C-Arm 1-60 Min  Result Date: 08/31/2021 CLINICAL DATA:  Left IM nail EXAM: DG C-ARM 1-60 MIN COMPARISON:  Pelvis XRs, 08/31/2021. FLUOROSCOPY: Exposure Index (as provided by the fluoroscopic device): 35.4 mGy Kerma FINDINGS: Multiple frontal and oblique planar images of the LEFT hip obtained by C-arm Images demonstrating LEFT proximal femoral screw and intramedullary nail fixation for intertrochanteric femoral fracture. IMPRESSION: Fluoroscopic imaging for LEFT proximal femoral fixation For complete description of intra procedural findings, please see performing service dictation. Electronically Signed   By: Roanna Banning M.D.   On: 08/31/2021 14:51   DG HIP UNILAT WITH PELVIS 2-3 VIEWS LEFT  Result Date: 08/31/2021 CLINICAL DATA:  Fluoroscopic assistance for internal fixation of fracture of left femur EXAM: DG HIP (WITH OR WITHOUT PELVIS) 2-3V LEFT COMPARISON:  Study done earlier today FINDINGS: Fluoroscopic images show reduction and internal fixation of comminuted intertrochanteric fracture of proximal left femur with intramedullary rod. Fluoroscopic time was 2.55 seconds. Radiation dose  35.38 mGy. IMPRESSION: Fluoroscopic assistance was provided for internal fixation of fracture of the proximal left femur. Electronically Signed   By: Ernie Avena M.D.   On: 08/31/2021 14:49   DG Chest Portable 1 View  Result Date: 08/31/2021 CLINICAL DATA:  Left hip fracture.  Preoperative study. EXAM: PORTABLE CHEST 1 VIEW COMPARISON:  Chest x-ray dated September 08, 2019. FINDINGS: The heart size and mediastinal contours are within normal limits. Normal pulmonary vascularity. No focal consolidation, pleural effusion, or pneumothorax. Few small calcified granulomas again noted. No acute osseous abnormality. IMPRESSION: 1. No active disease. Electronically Signed   By: Obie Dredge M.D.   On: 08/31/2021 10:18   CT Lumbar Spine Wo Contrast  Result Date: 08/31/2021 CLINICAL DATA:  Lumbar radiculopathy, trauma Low back pain and some complaints of left leg tingling with hip pain after fall EXAM: CT LUMBAR SPINE WITHOUT CONTRAST TECHNIQUE: Multidetector CT imaging of the lumbar spine was performed without intravenous contrast administration. Multiplanar CT image reconstructions were also generated. RADIATION DOSE REDUCTION: This exam was performed according to the departmental dose-optimization program which includes automated exposure control, adjustment of the mA and/or kV according to patient size and/or use of iterative reconstruction technique. COMPARISON:  None Available. FINDINGS: Segmentation: 5 lumbar type vertebrae. Alignment: Dextroconvex lumbar curvature. Mild retrolisthesis at L2-L3. Grade analysis at L4-L5 and trace anterolisthesis at L5-S1. Vertebrae: There is mild superior endplate depression on the right at L1, unchanged in comparison to prior radiograph in December 2020. No evidence of acute lumbar spine fracture. There are bilateral sacral fractures with mixed sclerosis in zones 1 through 3 bilaterally. There is bony productive change anteriorly at S2. Persistent residual fracture lines  are visible. Paraspinal and other soft tissues: Aortoiliac atherosclerosis. Simple right renal cyst which requires no follow-up imaging. Mild paraspinal muscle atrophy. Disc levels: L1-L2: No significant spinal canal or neural foraminal narrowing. L2-L3: Trace retrolisthesis with broad-based disc bulging, ligament flavum hypertrophy and mild facet arthropathy. There is mild spinal canal and right neural foraminal stenosis. L3-L4: Broad-based disc bulging, ligamentum hypertrophy and mild facet arthropathy. Mild spinal canal stenosis. No significant neural foraminal stenosis. L4-L5: There is severe disc height loss with trace degenerative anterolisthesis, broad-based disc bulging, ligamentum flavum hypertrophy and mild facet arthropathy. There is mild to moderate spinal canal stenosis, severe left and no significant right neural foraminal stenosis. L5-S1: Disc height loss with broad-based disc bulging, ligamentum flavum hypertrophy and mild facet arthropathy. No significant spinal canal stenosis. Moderate bilateral neural foraminal stenosis. IMPRESSION: Bilateral sacral insufficiency fractures, with mixed sclerosis throughout the visible sacrum, favoring these to be subacute-chronic. MRI could determine acuity if clinically indicated. No evidence of acute lumbar spine fracture. Mild right-sided superior endplate depression of L1, unchanged in comparison to prior radiograph in December 2020. Multilevel degenerative changes of the lumbar spine, most prominent at L4-L5 and L5-S1. Severe left neural foraminal stenosis and mild-to-moderate spinal canal stenosis at L4-L5. Moderate bilateral neural foraminal stenosis at L5-S1. Additional mild degenerative changes at L2-L3 and L3-L4 as described above. Electronically Signed   By: Caprice Renshaw M.D.   On: 08/31/2021 09:38   CT Head Wo Contrast  Result Date: 08/31/2021 CLINICAL DATA:  Head trauma, minor (Age >= 65y); Neck trauma (Age >= 65y) EXAM: CT HEAD WITHOUT CONTRAST CT  CERVICAL SPINE WITHOUT CONTRAST TECHNIQUE: Multidetector CT imaging of the head and cervical spine was performed following the standard protocol without intravenous contrast. Multiplanar CT image reconstructions of the cervical spine were also generated. RADIATION DOSE REDUCTION: This exam was performed according to the departmental dose-optimization program which includes automated exposure control, adjustment of the mA and/or kV according to patient size and/or use of iterative reconstruction technique. COMPARISON:  CT head cervical spine 02/28/2020 FINDINGS: CT HEAD FINDINGS Brain: No evidence of acute intracranial hemorrhage or extra-axial collection.Patent basal cisterns.The ventricles are unchanged in size, out of proportion to degree of cerebral atrophy, could reflect normal pressure hydrocephalus in the appropriate clinical setting.Scattered subcortical and periventricular white matter hypodensities, nonspecific but likely sequela of chronic small vessel ischemic disease.Mild cerebral atrophy Vascular: No hyperdense vessel. Skull: Negative for skull fracture. Sinuses/Orbits: Minimal ethmoid air cell mucosal thickening. Orbits are unremarkable. Other: None. CT CERVICAL SPINE FINDINGS Alignment: Degenerative grade 1 listhesis at C3-C4, C4-C5,  C6-C7, and C7-T1. Skull base and vertebrae: No evidence of acute cervical spine fracture. No aggressive osseous lesion. Soft tissues and spinal canal: No prevertebral fluid or swelling. No visible canal hematoma. Disc levels: There is multilevel degenerative disc disease, most prominent from C4 through C7, moderate-severe at C5-C6. There is moderate to severe multilevel facet arthropathy with bony fusion bilaterally at C4-C5. Upper chest: Focal soft tissue swelling in the left lower neck/supraclavicular region (series 3, image 62). No focal fluid collection. Lung apices are clear. Other: None IMPRESSION: No acute intracranial abnormality.  Stable head CT. No acute  cervical spine fracture. Multilevel degenerative disc disease and facet arthropathy. Focal soft tissue swelling in the left lower neck/supraclavicular region, could reflect a small contusion. Electronically Signed   By: Caprice Renshaw M.D.   On: 08/31/2021 09:28   CT Cervical Spine Wo Contrast  Result Date: 08/31/2021 CLINICAL DATA:  Head trauma, minor (Age >= 65y); Neck trauma (Age >= 65y) EXAM: CT HEAD WITHOUT CONTRAST CT CERVICAL SPINE WITHOUT CONTRAST TECHNIQUE: Multidetector CT imaging of the head and cervical spine was performed following the standard protocol without intravenous contrast. Multiplanar CT image reconstructions of the cervical spine were also generated. RADIATION DOSE REDUCTION: This exam was performed according to the departmental dose-optimization program which includes automated exposure control, adjustment of the mA and/or kV according to patient size and/or use of iterative reconstruction technique. COMPARISON:  CT head cervical spine 02/28/2020 FINDINGS: CT HEAD FINDINGS Brain: No evidence of acute intracranial hemorrhage or extra-axial collection.Patent basal cisterns.The ventricles are unchanged in size, out of proportion to degree of cerebral atrophy, could reflect normal pressure hydrocephalus in the appropriate clinical setting.Scattered subcortical and periventricular white matter hypodensities, nonspecific but likely sequela of chronic small vessel ischemic disease.Mild cerebral atrophy Vascular: No hyperdense vessel. Skull: Negative for skull fracture. Sinuses/Orbits: Minimal ethmoid air cell mucosal thickening. Orbits are unremarkable. Other: None. CT CERVICAL SPINE FINDINGS Alignment: Degenerative grade 1 listhesis at C3-C4, C4-C5, C6-C7, and C7-T1. Skull base and vertebrae: No evidence of acute cervical spine fracture. No aggressive osseous lesion. Soft tissues and spinal canal: No prevertebral fluid or swelling. No visible canal hematoma. Disc levels: There is multilevel  degenerative disc disease, most prominent from C4 through C7, moderate-severe at C5-C6. There is moderate to severe multilevel facet arthropathy with bony fusion bilaterally at C4-C5. Upper chest: Focal soft tissue swelling in the left lower neck/supraclavicular region (series 3, image 62). No focal fluid collection. Lung apices are clear. Other: None IMPRESSION: No acute intracranial abnormality.  Stable head CT. No acute cervical spine fracture. Multilevel degenerative disc disease and facet arthropathy. Focal soft tissue swelling in the left lower neck/supraclavicular region, could reflect a small contusion. Electronically Signed   By: Caprice Renshaw M.D.   On: 08/31/2021 09:28   DG Hip Unilat With Pelvis 2-3 Views Left  Result Date: 08/31/2021 CLINICAL DATA:  Fall. EXAM: DG HIP (WITH OR WITHOUT PELVIS) 2-3V LEFT COMPARISON:  February 28, 2020. FINDINGS: Acute left intertrochanteric femur fracture with apex anterior angulation, but no significant displacement. Healed right subcapital femoral neck fracture status post screw fixation. No dislocation. Unchanged mild right hip axial joint space narrowing. The left hip joint space is preserved. The pubic symphysis and sacroiliac joints are intact. Osteopenia. Soft tissues are unremarkable. IMPRESSION: 1. Acute left intertrochanteric femur fracture. Electronically Signed   By: Obie Dredge M.D.   On: 08/31/2021 09:03    Microbiology: Results for orders placed or performed during the hospital encounter of 09/08/19  SARS Coronavirus 2 by RT PCR (hospital order, performed in [facility] hospital lab) Nasopharyngeal Nasopharyngeal Swab     Status: None   Collection Time: 09/08/19  5:25 PM   Specimen: Nasopharyngeal Swab  Result Value Ref Range Status   SARS Coronavirus 2 NEGATIVE NEGATIVE Final    Comment: (NOTE) SARS-CoV-2 target nucleic acids are NOT DETECTED.  The SARS-CoV-2 RNA is generally detectable in upper and lower respiratory specimens during the  acute phase of infection. The lowest concentration of SARS-CoV-2 viral copies this assay can detect is 250 copies / mL. A negative result does not preclude SARS-CoV-2 infection and should not be used as the sole basis for treatment or other patient management decisions.  A negative result may occur with improper specimen collection / handling, submission of specimen other than nasopharyngeal swab, presence of viral mutation(s) within the areas targeted by this assay, and inadequate number of viral copies (<250 copies / mL). A negative result must be combined with clinical observations, patient history, and epidemiological information.  Fact Sheet for Patients:   BoilerBrush.com.cyhttps://www.fda.gov/media/136312/download  Fact Sheet for Healthcare Providers: https://pope.com/https://www.fda.gov/media/136313/download  This test is not yet approved or  cleared by the [place] FDA and has been authorized for detection and/or diagnosis of SARS-CoV-2 by FDA under an Emergency Use Authorization (EUA).  This EUA will remain in effect (meaning this test can be used) for the duration of the COVID-19 declaration under Section 564(b)(1) of the Act, 21 U.S.C. section 360bbb-3(b)(1), unless the authorization is terminated or revoked sooner.  Performed at [facility], [address] [place], [place] [address]   Surgical pcr screen     Status: Abnormal   Collection Time: 09/08/19  6:01 PM   Specimen: Nasal Mucosa; Nasal Swab  Result Value Ref Range Status   MRSA, PCR POSITIVE (A) NEGATIVE Final   Staphylococcus aureus POSITIVE (A) NEGATIVE Final    Comment: RESULT CALLED TO, READ BACK BY AND VERIFIED WITH: Cammy CopaSTEVEN SIKES 09/08/19 AT 1958 BY ACR (NOTE) The Xpert SA Assay (FDA approved for NASAL specimens in patients 86 years of age and older), is one component of a comprehensive surveillance program. It is not intended to diagnose infection nor to guide or monitor treatment. Performed at [facility],  [address] [place], [place] [address]     Labs: CBC: Recent Labs  Lab 09/03/21 0402 09/03/21 0934 09/04/21 0350 09/04/21 1722 09/05/21 0421 09/05/21 1737 09/06/21 0523 09/08/21 0612  WBC 12.1*  --  15.4*  --  11.2*  --  11.0* 11.6*  NEUTROABS  --   --   --   --   --   --   --  8.6*  HGB 6.7*   < > 6.3* 7.6* 7.4* 7.6* 7.3* 6.4*  HCT 20.3*   < > 18.9* 23.4* 22.8* 23.4* 22.5* 19.6*  MCV 91.0  --  91.7  --  88.0  --  87.9 90.3  PLT 109*  --  167  --  157  --  210 280   < > = values in this interval not displayed.   Basic Metabolic Panel: Recent Labs  Lab 09/04/21 0350 09/05/21 0421 09/08/21 0612  NA 135 136 132*  K 3.3* 3.9 3.6  CL 103 106 103  CO2 25 24 25   GLUCOSE 138* 121* 106*  BUN 27* 23 18  CREATININE 0.79 0.59 0.58  CALCIUM 7.8* 7.6* 7.6*  MG  --  2.4  --    Liver Function Tests: No  results for input(s): "AST", "ALT", "ALKPHOS", "BILITOT", "PROT", "ALBUMIN" in the last 168 hours. CBG: No results for input(s): "GLUCAP" in the last 168 hours.  Discharge time spent: greater than 30 minutes.  Signed: Daffney Greenly, DO [facility] 09/09/2021

## 2021-09-22 DEATH — deceased
